# Patient Record
Sex: Female | Born: 1935 | Race: Black or African American | Hispanic: No | Smoking: Never smoker
Health system: Southern US, Community
[De-identification: ages and names within clinical notes are randomized; demographics above are authoritative.]

## PROBLEM LIST (undated history)

## (undated) DIAGNOSIS — J449 Chronic obstructive pulmonary disease, unspecified: Secondary | ICD-10-CM

## (undated) DIAGNOSIS — J45909 Unspecified asthma, uncomplicated: Secondary | ICD-10-CM

## (undated) DIAGNOSIS — M1990 Unspecified osteoarthritis, unspecified site: Secondary | ICD-10-CM

## (undated) DIAGNOSIS — I2510 Atherosclerotic heart disease of native coronary artery without angina pectoris: Secondary | ICD-10-CM

## (undated) DIAGNOSIS — I499 Cardiac arrhythmia, unspecified: Secondary | ICD-10-CM

## (undated) DIAGNOSIS — G7000 Myasthenia gravis without (acute) exacerbation: Secondary | ICD-10-CM

## (undated) DIAGNOSIS — E039 Hypothyroidism, unspecified: Secondary | ICD-10-CM

## (undated) DIAGNOSIS — E119 Type 2 diabetes mellitus without complications: Secondary | ICD-10-CM

## (undated) DIAGNOSIS — I4891 Unspecified atrial fibrillation: Secondary | ICD-10-CM

## (undated) DIAGNOSIS — K219 Gastro-esophageal reflux disease without esophagitis: Secondary | ICD-10-CM

## (undated) DIAGNOSIS — J439 Emphysema, unspecified: Secondary | ICD-10-CM

## (undated) HISTORY — PX: PARTIAL HYSTERECTOMY: SHX80

## (undated) HISTORY — PX: BREAST BIOPSY: SHX20

## (undated) HISTORY — PX: TONSILLECTOMY: SUR1361

## (undated) HISTORY — PX: CATARACT EXTRACTION: SUR2

## (undated) HISTORY — DX: Emphysema, unspecified: J43.9

## (undated) HISTORY — DX: Gastro-esophageal reflux disease without esophagitis: K21.9

---

## 2004-06-08 DIAGNOSIS — M810 Age-related osteoporosis without current pathological fracture: Secondary | ICD-10-CM | POA: Insufficient documentation

## 2004-09-12 ENCOUNTER — Ambulatory Visit: Payer: Self-pay | Admitting: Family Medicine

## 2005-11-14 ENCOUNTER — Ambulatory Visit: Payer: Self-pay | Admitting: Family Medicine

## 2007-01-14 ENCOUNTER — Ambulatory Visit: Payer: Self-pay | Admitting: Family Medicine

## 2007-01-21 ENCOUNTER — Ambulatory Visit: Payer: Self-pay | Admitting: Family Medicine

## 2008-09-17 ENCOUNTER — Ambulatory Visit: Payer: Self-pay | Admitting: Family Medicine

## 2009-04-04 ENCOUNTER — Ambulatory Visit: Payer: Self-pay | Admitting: Family Medicine

## 2009-05-01 ENCOUNTER — Emergency Department: Payer: Self-pay | Admitting: Emergency Medicine

## 2009-07-11 DIAGNOSIS — R002 Palpitations: Secondary | ICD-10-CM | POA: Insufficient documentation

## 2009-07-20 DIAGNOSIS — E7849 Other hyperlipidemia: Secondary | ICD-10-CM | POA: Insufficient documentation

## 2009-11-17 ENCOUNTER — Ambulatory Visit: Payer: Self-pay | Admitting: Family Medicine

## 2011-09-13 ENCOUNTER — Ambulatory Visit: Payer: Self-pay | Admitting: Family Medicine

## 2012-02-04 DIAGNOSIS — G4733 Obstructive sleep apnea (adult) (pediatric): Secondary | ICD-10-CM | POA: Insufficient documentation

## 2012-12-09 ENCOUNTER — Ambulatory Visit: Payer: Self-pay | Admitting: Family Medicine

## 2013-01-10 ENCOUNTER — Emergency Department: Payer: Self-pay | Admitting: Emergency Medicine

## 2013-01-10 LAB — BASIC METABOLIC PANEL
Anion Gap: 5 — ABNORMAL LOW (ref 7–16)
Calcium, Total: 9.4 mg/dL (ref 8.5–10.1)
Co2: 31 mmol/L (ref 21–32)
EGFR (African American): >60
EGFR (Non-African Amer.): >60
Osmolality: 284 (ref 275–301)
Potassium: 3.9 mmol/L (ref 3.5–5.1)
Sodium: 141 mmol/L (ref 136–145)

## 2013-01-10 LAB — CBC
HCT: 41.5 % (ref 35.0–47.0)
HGB: 14 g/dL (ref 12.0–16.0)
MCH: 34.1 pg — ABNORMAL HIGH (ref 26.0–34.0)
Platelet: 250 10*3/uL (ref 150–440)
RBC: 4.1 10*6/uL (ref 3.80–5.20)
WBC: 3.9 10*3/uL (ref 3.6–11.0)

## 2013-01-10 LAB — APTT: Activated PTT: 31.5 secs (ref 23.6–35.9)

## 2013-01-10 LAB — CK TOTAL AND CKMB (NOT AT ARMC): CK, Total: 82 U/L (ref 21–215)

## 2013-01-10 LAB — PROTIME-INR: Prothrombin Time: 12.1 secs (ref 11.5–14.7)

## 2013-01-16 ENCOUNTER — Ambulatory Visit: Payer: Self-pay | Admitting: Internal Medicine

## 2013-04-22 ENCOUNTER — Ambulatory Visit: Payer: Self-pay | Admitting: Family Medicine

## 2013-06-21 LAB — COMPREHENSIVE METABOLIC PANEL
Albumin: 3.3 g/dL — ABNORMAL LOW (ref 3.4–5.0)
Alkaline Phosphatase: 67 U/L (ref 50–136)
EGFR (African American): >60
EGFR (Non-African Amer.): >60
Potassium: 3.9 mmol/L (ref 3.5–5.1)
SGPT (ALT): 12 U/L (ref 12–78)
Total Protein: 7.5 g/dL (ref 6.4–8.2)

## 2013-06-21 LAB — CBC
MCHC: 31.3 g/dL — ABNORMAL LOW (ref 32.0–36.0)
Platelet: 226 10*3/uL (ref 150–440)

## 2013-06-21 LAB — PRO B NATRIURETIC PEPTIDE: B-Type Natriuretic Peptide: 528 pg/mL — ABNORMAL HIGH (ref 0–450)

## 2013-06-21 LAB — TROPONIN I: Troponin-I: <0.02 ng/mL

## 2013-06-21 LAB — CK TOTAL AND CKMB (NOT AT ARMC): CK-MB: 1.1 ng/mL (ref 0.5–3.6)

## 2013-06-22 ENCOUNTER — Inpatient Hospital Stay: Payer: Self-pay | Admitting: Internal Medicine

## 2013-06-22 LAB — CK TOTAL AND CKMB (NOT AT ARMC)
CK, Total: 86 U/L (ref 21–215)
CK, Total: 106 U/L (ref 21–215)
CK-MB: 2 ng/mL (ref 0.5–3.6)

## 2013-06-22 LAB — TROPONIN I: Troponin-I: <0.02 ng/mL

## 2013-06-23 LAB — CBC WITH DIFFERENTIAL/PLATELET
Basophil #: 0 10*3/uL (ref 0.0–0.1)
Basophil %: 0.1 %
Eosinophil %: 0 %
HCT: 34.8 % — ABNORMAL LOW (ref 35.0–47.0)
HGB: 11.9 g/dL — ABNORMAL LOW (ref 12.0–16.0)
Lymphocyte #: 0.2 10*3/uL — ABNORMAL LOW (ref 1.0–3.6)
MCH: 33.8 pg (ref 26.0–34.0)
MCHC: 34.2 g/dL (ref 32.0–36.0)
MCV: 99 fL (ref 80–100)
Monocyte #: 0.4 x10 3/mm (ref 0.2–0.9)
Neutrophil %: 91.4 %
Platelet: 253 10*3/uL (ref 150–440)
RBC: 3.53 10*6/uL — ABNORMAL LOW (ref 3.80–5.20)
WBC: 7.7 10*3/uL (ref 3.6–11.0)

## 2013-06-23 LAB — BASIC METABOLIC PANEL
Calcium, Total: 9 mg/dL (ref 8.5–10.1)
Chloride: 98 mmol/L (ref 98–107)
Glucose: 198 mg/dL — ABNORMAL HIGH (ref 65–99)
Osmolality: 283 (ref 275–301)

## 2013-06-23 LAB — HEMOGLOBIN A1C: Hemoglobin A1C: 7.4 % — ABNORMAL HIGH (ref 4.2–6.3)

## 2013-06-23 LAB — MAGNESIUM: Magnesium: 1.5 mg/dL — ABNORMAL LOW

## 2013-06-23 LAB — LIPID PANEL
Cholesterol: 158 mg/dL (ref 0–200)
HDL Cholesterol: 81 mg/dL — ABNORMAL HIGH (ref 40–60)
VLDL Cholesterol, Calc: 10 mg/dL (ref 5–40)

## 2013-06-26 LAB — CULTURE, BLOOD (SINGLE)

## 2014-02-19 ENCOUNTER — Inpatient Hospital Stay: Payer: Self-pay | Admitting: Student

## 2014-02-19 LAB — BASIC METABOLIC PANEL
ANION GAP: 2 — AB (ref 7–16)
BUN: 10 mg/dL (ref 7–18)
CHLORIDE: 106 mmol/L (ref 98–107)
CREATININE: 0.66 mg/dL (ref 0.60–1.30)
Calcium, Total: 8.6 mg/dL (ref 8.5–10.1)
Co2: 30 mmol/L (ref 21–32)
EGFR (Non-African Amer.): >60
Glucose: 87 mg/dL (ref 65–99)
Osmolality: 274 (ref 275–301)
POTASSIUM: 4.5 mmol/L (ref 3.5–5.1)
Sodium: 138 mmol/L (ref 136–145)

## 2014-02-19 LAB — CBC
HCT: 34.1 % — AB (ref 35.0–47.0)
HGB: 10.9 g/dL — AB (ref 12.0–16.0)
MCH: 32.6 pg (ref 26.0–34.0)
MCHC: 31.8 g/dL — ABNORMAL LOW (ref 32.0–36.0)
MCV: 103 fL — AB (ref 80–100)
Platelet: 286 10*3/uL (ref 150–440)
RBC: 3.33 10*6/uL — ABNORMAL LOW (ref 3.80–5.20)
RDW: 17.1 % — ABNORMAL HIGH (ref 11.5–14.5)
WBC: 4.5 10*3/uL (ref 3.6–11.0)

## 2014-02-19 LAB — PRO B NATRIURETIC PEPTIDE: B-Type Natriuretic Peptide: 2214 pg/mL — ABNORMAL HIGH (ref 0–450)

## 2014-02-19 LAB — TROPONIN I

## 2014-02-19 LAB — TSH: THYROID STIMULATING HORM: 1.96 u[IU]/mL

## 2014-02-20 LAB — CBC WITH DIFFERENTIAL/PLATELET
BASOS PCT: 0.2 %
Basophil #: 0 10*3/uL (ref 0.0–0.1)
EOS PCT: 0 %
Eosinophil #: 0 10*3/uL (ref 0.0–0.7)
HCT: 33.7 % — AB (ref 35.0–47.0)
HGB: 11 g/dL — AB (ref 12.0–16.0)
Lymphocyte #: 0.1 10*3/uL — ABNORMAL LOW (ref 1.0–3.6)
Lymphocyte %: 1.9 %
MCH: 33.4 pg (ref 26.0–34.0)
MCHC: 32.6 g/dL (ref 32.0–36.0)
MCV: 103 fL — ABNORMAL HIGH (ref 80–100)
MONO ABS: 0.1 x10 3/mm — AB (ref 0.2–0.9)
MONOS PCT: 1.9 %
NEUTROS ABS: 5.6 10*3/uL (ref 1.4–6.5)
NEUTROS PCT: 96 %
PLATELETS: 290 10*3/uL (ref 150–440)
RBC: 3.28 10*6/uL — ABNORMAL LOW (ref 3.80–5.20)
RDW: 17 % — ABNORMAL HIGH (ref 11.5–14.5)
WBC: 5.8 10*3/uL (ref 3.6–11.0)

## 2014-02-20 LAB — BASIC METABOLIC PANEL
Anion Gap: 2 — ABNORMAL LOW (ref 7–16)
BUN: 14 mg/dL (ref 7–18)
CALCIUM: 8.7 mg/dL (ref 8.5–10.1)
CHLORIDE: 104 mmol/L (ref 98–107)
CO2: 34 mmol/L — AB (ref 21–32)
Creatinine: 0.72 mg/dL (ref 0.60–1.30)
GLUCOSE: 178 mg/dL — AB (ref 65–99)
Osmolality: 284 (ref 275–301)
Potassium: 4.3 mmol/L (ref 3.5–5.1)
SODIUM: 140 mmol/L (ref 136–145)

## 2014-02-21 LAB — BASIC METABOLIC PANEL
ANION GAP: 5 — AB (ref 7–16)
BUN: 31 mg/dL — ABNORMAL HIGH (ref 7–18)
CALCIUM: 8.1 mg/dL — AB (ref 8.5–10.1)
CHLORIDE: 99 mmol/L (ref 98–107)
CREATININE: 1.26 mg/dL (ref 0.60–1.30)
Co2: 34 mmol/L — ABNORMAL HIGH (ref 21–32)
EGFR (Non-African Amer.): 41 — ABNORMAL LOW
GFR CALC AF AMER: 48 — AB
Glucose: 207 mg/dL — ABNORMAL HIGH (ref 65–99)
Osmolality: 288 (ref 275–301)
Potassium: 4.2 mmol/L (ref 3.5–5.1)
Sodium: 138 mmol/L (ref 136–145)

## 2014-02-21 LAB — CBC WITH DIFFERENTIAL/PLATELET
BASOS ABS: 0 10*3/uL (ref 0.0–0.1)
Basophil %: 0 %
EOS PCT: 0 %
Eosinophil #: 0 10*3/uL (ref 0.0–0.7)
HCT: 30.6 % — AB (ref 35.0–47.0)
HGB: 10.2 g/dL — ABNORMAL LOW (ref 12.0–16.0)
LYMPHS PCT: 1.8 %
Lymphocyte #: 0.1 10*3/uL — ABNORMAL LOW (ref 1.0–3.6)
MCH: 33.7 pg (ref 26.0–34.0)
MCHC: 33.2 g/dL (ref 32.0–36.0)
MCV: 102 fL — AB (ref 80–100)
MONO ABS: 0.3 x10 3/mm (ref 0.2–0.9)
MONOS PCT: 4.6 %
Neutrophil #: 6.5 10*3/uL (ref 1.4–6.5)
Neutrophil %: 93.6 %
Platelet: 277 10*3/uL (ref 150–440)
RBC: 3.01 10*6/uL — AB (ref 3.80–5.20)
RDW: 16.9 % — ABNORMAL HIGH (ref 11.5–14.5)
WBC: 7 10*3/uL (ref 3.6–11.0)

## 2014-02-22 LAB — BASIC METABOLIC PANEL
Anion Gap: 3 — ABNORMAL LOW (ref 7–16)
BUN: 41 mg/dL — ABNORMAL HIGH (ref 7–18)
Calcium, Total: 8.1 mg/dL — ABNORMAL LOW (ref 8.5–10.1)
Chloride: 99 mmol/L (ref 98–107)
Co2: 33 mmol/L — ABNORMAL HIGH (ref 21–32)
Creatinine: 1.14 mg/dL (ref 0.60–1.30)
EGFR (Non-African Amer.): 46 — ABNORMAL LOW
GFR CALC AF AMER: 54 — AB
GLUCOSE: 214 mg/dL — AB (ref 65–99)
OSMOLALITY: 287 (ref 275–301)
Potassium: 4.2 mmol/L (ref 3.5–5.1)
Sodium: 135 mmol/L — ABNORMAL LOW (ref 136–145)

## 2014-02-23 LAB — BASIC METABOLIC PANEL
ANION GAP: 2 — AB (ref 7–16)
BUN: 43 mg/dL — ABNORMAL HIGH (ref 7–18)
Calcium, Total: 8.3 mg/dL — ABNORMAL LOW (ref 8.5–10.1)
Chloride: 96 mmol/L — ABNORMAL LOW (ref 98–107)
Co2: 35 mmol/L — ABNORMAL HIGH (ref 21–32)
Creatinine: 1.03 mg/dL (ref 0.60–1.30)
EGFR (African American): >60
GFR CALC NON AF AMER: 52 — AB
Glucose: 203 mg/dL — ABNORMAL HIGH (ref 65–99)
Osmolality: 283 (ref 275–301)
Potassium: 4 mmol/L (ref 3.5–5.1)
Sodium: 133 mmol/L — ABNORMAL LOW (ref 136–145)

## 2014-02-23 LAB — BUN: BUN: 38 mg/dL — ABNORMAL HIGH (ref 7–18)

## 2014-02-23 LAB — CREATININE, SERUM
Creatinine: 0.96 mg/dL (ref 0.60–1.30)
EGFR (Non-African Amer.): 57 — ABNORMAL LOW

## 2014-02-25 LAB — HEMOGLOBIN: HGB: 12 g/dL (ref 12.0–16.0)

## 2014-02-26 LAB — BASIC METABOLIC PANEL
Anion Gap: 1 — ABNORMAL LOW (ref 7–16)
BUN: 42 mg/dL — ABNORMAL HIGH (ref 7–18)
CALCIUM: 8.4 mg/dL — AB (ref 8.5–10.1)
CHLORIDE: 94 mmol/L — AB (ref 98–107)
CREATININE: 0.9 mg/dL (ref 0.60–1.30)
Co2: 35 mmol/L — ABNORMAL HIGH (ref 21–32)
Glucose: 292 mg/dL — ABNORMAL HIGH (ref 65–99)
Osmolality: 282 (ref 275–301)
POTASSIUM: 4.9 mmol/L (ref 3.5–5.1)
SODIUM: 130 mmol/L — AB (ref 136–145)

## 2014-03-12 ENCOUNTER — Inpatient Hospital Stay: Payer: Self-pay | Admitting: Internal Medicine

## 2014-03-12 LAB — COMPREHENSIVE METABOLIC PANEL
ALT: 16 U/L (ref 12–78)
AST: 10 U/L — AB (ref 15–37)
Albumin: 3.5 g/dL (ref 3.4–5.0)
Alkaline Phosphatase: 49 U/L
Anion Gap: 10 (ref 7–16)
BUN: 36 mg/dL — AB (ref 7–18)
Bilirubin,Total: 0.6 mg/dL (ref 0.2–1.0)
CALCIUM: 9 mg/dL (ref 8.5–10.1)
CO2: 24 mmol/L (ref 21–32)
Chloride: 92 mmol/L — ABNORMAL LOW (ref 98–107)
Creatinine: 3.35 mg/dL — ABNORMAL HIGH (ref 0.60–1.30)
EGFR (Non-African Amer.): 13 — ABNORMAL LOW
GFR CALC AF AMER: 15 — AB
Glucose: 158 mg/dL — ABNORMAL HIGH (ref 65–99)
Osmolality: 265 (ref 275–301)
Potassium: 5.7 mmol/L — ABNORMAL HIGH (ref 3.5–5.1)
SODIUM: 126 mmol/L — AB (ref 136–145)
Total Protein: 6.9 g/dL (ref 6.4–8.2)

## 2014-03-12 LAB — URINALYSIS, COMPLETE
BILIRUBIN, UR: NEGATIVE
Bacteria: NONE SEEN
Blood: NEGATIVE
Glucose,UR: 50 mg/dL (ref 0–75)
Ketone: NEGATIVE
Nitrite: NEGATIVE
PH: 5 (ref 4.5–8.0)
Protein: 30
RBC,UR: 5 /HPF (ref 0–5)
SPECIFIC GRAVITY: 1.023 (ref 1.003–1.030)
Squamous Epithelial: 1

## 2014-03-12 LAB — CBC
HCT: 36.7 % (ref 35.0–47.0)
HGB: 12.3 g/dL (ref 12.0–16.0)
MCH: 33.8 pg (ref 26.0–34.0)
MCHC: 33.5 g/dL (ref 32.0–36.0)
MCV: 101 fL — ABNORMAL HIGH (ref 80–100)
Platelet: 194 10*3/uL (ref 150–440)
RBC: 3.64 10*6/uL — AB (ref 3.80–5.20)
RDW: 16.5 % — ABNORMAL HIGH (ref 11.5–14.5)
WBC: 3.3 10*3/uL — ABNORMAL LOW (ref 3.6–11.0)

## 2014-03-13 LAB — CBC WITH DIFFERENTIAL/PLATELET
BASOS ABS: 0 10*3/uL (ref 0.0–0.1)
BASOS PCT: 0.9 %
Eosinophil #: 0.1 10*3/uL (ref 0.0–0.7)
Eosinophil %: 4.6 %
HCT: 32.3 % — AB (ref 35.0–47.0)
HGB: 11.1 g/dL — ABNORMAL LOW (ref 12.0–16.0)
Lymphocyte #: 0.5 10*3/uL — ABNORMAL LOW (ref 1.0–3.6)
Lymphocyte %: 20.3 %
MCH: 34.2 pg — AB (ref 26.0–34.0)
MCHC: 34.4 g/dL (ref 32.0–36.0)
MCV: 100 fL (ref 80–100)
Monocyte #: 0.4 x10 3/mm (ref 0.2–0.9)
Monocyte %: 15.7 %
Neutrophil #: 1.6 10*3/uL (ref 1.4–6.5)
Neutrophil %: 58.5 %
Platelet: 175 10*3/uL (ref 150–440)
RBC: 3.24 10*6/uL — ABNORMAL LOW (ref 3.80–5.20)
RDW: 16.6 % — AB (ref 11.5–14.5)
WBC: 2.7 10*3/uL — AB (ref 3.6–11.0)

## 2014-03-13 LAB — BASIC METABOLIC PANEL
Anion Gap: 7 (ref 7–16)
BUN: 32 mg/dL — ABNORMAL HIGH (ref 7–18)
CALCIUM: 7.9 mg/dL — AB (ref 8.5–10.1)
Chloride: 99 mmol/L (ref 98–107)
Co2: 25 mmol/L (ref 21–32)
Creatinine: 1.92 mg/dL — ABNORMAL HIGH (ref 0.60–1.30)
EGFR (African American): 29 — ABNORMAL LOW
EGFR (Non-African Amer.): 25 — ABNORMAL LOW
GLUCOSE: 112 mg/dL — AB (ref 65–99)
OSMOLALITY: 270 (ref 275–301)
Potassium: 4.9 mmol/L (ref 3.5–5.1)
Sodium: 131 mmol/L — ABNORMAL LOW (ref 136–145)

## 2014-03-13 LAB — TSH: THYROID STIMULATING HORM: 2.19 u[IU]/mL

## 2014-03-14 LAB — POTASSIUM: Potassium: 5 mmol/L (ref 3.5–5.1)

## 2014-03-14 LAB — BASIC METABOLIC PANEL
Anion Gap: 6 — ABNORMAL LOW (ref 7–16)
BUN: 26 mg/dL — ABNORMAL HIGH (ref 7–18)
CHLORIDE: 99 mmol/L (ref 98–107)
Calcium, Total: 8.3 mg/dL — ABNORMAL LOW (ref 8.5–10.1)
Co2: 24 mmol/L (ref 21–32)
Creatinine: 0.92 mg/dL (ref 0.60–1.30)
EGFR (Non-African Amer.): >60
Glucose: 212 mg/dL — ABNORMAL HIGH (ref 65–99)
Osmolality: 270 (ref 275–301)
Potassium: 5.3 mmol/L — ABNORMAL HIGH (ref 3.5–5.1)
Sodium: 129 mmol/L — ABNORMAL LOW (ref 136–145)

## 2014-05-10 LAB — LIPID PANEL
Cholesterol: 172 mg/dL (ref 0–200)
HDL: 66 mg/dL (ref 35–70)
LDL Cholesterol: 86 mg/dL
Triglycerides: 100 mg/dL (ref 40–160)

## 2014-06-01 ENCOUNTER — Ambulatory Visit: Payer: Self-pay | Admitting: Family Medicine

## 2014-06-24 ENCOUNTER — Ambulatory Visit: Payer: Self-pay | Admitting: Family Medicine

## 2014-07-19 DIAGNOSIS — I10 Essential (primary) hypertension: Secondary | ICD-10-CM | POA: Insufficient documentation

## 2014-09-02 ENCOUNTER — Ambulatory Visit: Payer: Self-pay | Admitting: Family Medicine

## 2014-09-06 ENCOUNTER — Ambulatory Visit: Payer: Self-pay | Admitting: Family Medicine

## 2014-09-24 DIAGNOSIS — J449 Chronic obstructive pulmonary disease, unspecified: Secondary | ICD-10-CM | POA: Insufficient documentation

## 2014-11-03 LAB — TSH: TSH: 3.73 u[IU]/mL (ref <=5.90)

## 2014-12-29 DIAGNOSIS — G4730 Sleep apnea, unspecified: Secondary | ICD-10-CM | POA: Diagnosis not present

## 2014-12-29 DIAGNOSIS — L570 Actinic keratosis: Secondary | ICD-10-CM | POA: Diagnosis not present

## 2014-12-29 DIAGNOSIS — B370 Candidal stomatitis: Secondary | ICD-10-CM | POA: Diagnosis not present

## 2014-12-29 DIAGNOSIS — D0462 Carcinoma in situ of skin of left upper limb, including shoulder: Secondary | ICD-10-CM | POA: Diagnosis not present

## 2014-12-29 DIAGNOSIS — G7000 Myasthenia gravis without (acute) exacerbation: Secondary | ICD-10-CM | POA: Diagnosis not present

## 2014-12-29 DIAGNOSIS — E119 Type 2 diabetes mellitus without complications: Secondary | ICD-10-CM | POA: Diagnosis not present

## 2014-12-29 DIAGNOSIS — D485 Neoplasm of uncertain behavior of skin: Secondary | ICD-10-CM | POA: Diagnosis not present

## 2014-12-29 DIAGNOSIS — G4733 Obstructive sleep apnea (adult) (pediatric): Secondary | ICD-10-CM | POA: Diagnosis not present

## 2014-12-29 LAB — CBC AND DIFFERENTIAL: WBC: 5.8 10^3/mL

## 2015-01-06 DIAGNOSIS — G4733 Obstructive sleep apnea (adult) (pediatric): Secondary | ICD-10-CM | POA: Diagnosis not present

## 2015-01-19 DIAGNOSIS — D0462 Carcinoma in situ of skin of left upper limb, including shoulder: Secondary | ICD-10-CM | POA: Diagnosis not present

## 2015-01-19 DIAGNOSIS — C44629 Squamous cell carcinoma of skin of left upper limb, including shoulder: Secondary | ICD-10-CM | POA: Diagnosis not present

## 2015-02-06 DIAGNOSIS — G4733 Obstructive sleep apnea (adult) (pediatric): Secondary | ICD-10-CM | POA: Diagnosis not present

## 2015-03-07 DIAGNOSIS — G4733 Obstructive sleep apnea (adult) (pediatric): Secondary | ICD-10-CM | POA: Diagnosis not present

## 2015-03-21 DIAGNOSIS — L923 Foreign body granuloma of the skin and subcutaneous tissue: Secondary | ICD-10-CM | POA: Diagnosis not present

## 2015-03-25 DIAGNOSIS — K219 Gastro-esophageal reflux disease without esophagitis: Secondary | ICD-10-CM | POA: Diagnosis not present

## 2015-03-25 DIAGNOSIS — N183 Chronic kidney disease, stage 3 (moderate): Secondary | ICD-10-CM | POA: Diagnosis not present

## 2015-03-25 DIAGNOSIS — R0609 Other forms of dyspnea: Secondary | ICD-10-CM | POA: Diagnosis not present

## 2015-03-25 DIAGNOSIS — I480 Paroxysmal atrial fibrillation: Secondary | ICD-10-CM | POA: Diagnosis not present

## 2015-03-29 DIAGNOSIS — G4733 Obstructive sleep apnea (adult) (pediatric): Secondary | ICD-10-CM | POA: Diagnosis not present

## 2015-03-29 DIAGNOSIS — R0609 Other forms of dyspnea: Secondary | ICD-10-CM | POA: Diagnosis not present

## 2015-03-29 DIAGNOSIS — J449 Chronic obstructive pulmonary disease, unspecified: Secondary | ICD-10-CM | POA: Diagnosis not present

## 2015-04-01 NOTE — H&P (Signed)
PATIENT NAME:  Julia Crawford, Julia Crawford MR#:  [id number] DATE OF BIRTH:  [birth year]  DATE OF ADMISSION:  06/22/2013  PRIMARY CARE PHYSICIAN: Dr. Margarita Rana.   REFERRING PHYSICIAN: Dr. Jasmine December.   CHIEF COMPLAINT: Shortness of breath, cough, lower extremity edema, right-sided chest pain.   HISTORY OF PRESENT ILLNESS: The patient is a 80 year old morbidly obese female with past medical history of asthma, COPD, hypothyroidism, myasthenia gravis, GERD, multiple other medical problems, is presenting to the ER with a chief complaint of a 102-month history of shortness of breath. The patient is reporting that her shortness of breath is gradually getting worse and for the past 1 month, she has been noticing swelling in her legs. Also, she has been coughing and bringing up somewhat whitish-yellow phlegm. Denies any fever. Denies any weight gain, though. The patient has chronic history of obstructive sleep apnea, and she could not tolerate CPAP in the past and her physician has recommended not to use CPAP or oxygen. Now, the patient thinks she would be better off with CPAP and wants to try CPAP tonight. As the patient was hypoxic, she was placed on 4 liters of oxygen in the ER, and CT angiogram of the chest is ordered to rule out pulmonary embolism. The study is done but the result is still pending at this time. The patient denies any history of smoking in the past. She has chronic history of myasthenia gravis and goes to [facility] regarding this and uses Imuran and pyridostigmine. The patient has received IV Lasix, and blood cultures were obtained. A 12-lead EKG has revealed a left bundle branch block and sinus bradycardia. The left bundle branch block is old according to the ER physician. During my examination, the patient was becoming short of breath with minimal exertion, and she was in sinus bradycardia. No family members are at bedside.   PAST MEDICAL HISTORY: Myasthenia gravis, obstructive sleep apnea, morbid  obesity, GERD, colon polyps, diabetes mellitus, asthma, hypothyroidism, angina.   PAST SURGICAL HISTORY: Thyroidectomy, partial hysterectomy, history of plasma exchange, tonsillectomy, multiple breast biopsies.   ALLERGIES: CODEINE AND DOXYCYCLINE.   PSYCHOSOCIAL HISTORY: She is married. On disability secondary to multiple medical problems. Denies any smoking, alcohol or illicit drug usage. Lives with daughter.   FAMILY HISTORY: Two brothers have a history of diabetes mellitus, and the patient's dad also has diabetes mellitus.   HOME MEDICATIONS: Azathioprine 50 mg 2-1/2 tablets orally once daily, Actos 30 mg 1/2 tablet once a day, Cardizem 240 mg extended release 1 capsule once daily, levothyroxine 100 mcg once daily, metoclopramide 10 mg 2 times a day, metformin 1000 mg 1/2 tablet orally at breakfast and lunchtime and 1000 mg at dinnertime, simvastatin 10 mg once daily, pyridostigmine 60 mg p.o. every 6 hours for myasthenia gravis.   REVIEW OF SYSTEMS:  CONSTITUTIONAL: Denies any fever but complaining of fatigue and weakness. Denies weight gain but complaining of swelling in her feet.  EYES: Denies any blurry vision, glaucoma,  ENT: Denies epistaxis, discharge.  RESPIRATION: Complaining of cough. Has history of asthma. According to her old medical records, she has COPD, but the patient denies any. Has obstructive sleep apnea, also.  CARDIOVASCULAR: Complaining of right-sided chest pain after sustaining a fall last Monday which is reproducible. Denies any palpitations or syncope.  GASTROINTESTINAL: Denies nausea, vomiting or diarrhea. No abdominal pain.  GENITOURINARY: No dysuria, hematuria.  GYNECOLOGIC AND BREASTS: Multiple breast biopsies in the past. Status post hysterectomy. No vaginal discharge.  ENDOCRINE: Denies any polyuria,  nocturia. Has thyroid problems.  HEMATOLOGIC AND LYMPHATIC: Denies any anemia or easy bruising.  INTEGUMENTARY: No acne, rash, lesions.  MUSCULOSKELETAL: No  joint pain in the neck, back or shoulder. Denies any gout.  NEUROLOGIC: Denies any vertigo, ataxia, dementia.  PSYCHIATRIC: Denies any ADD, OCD, bipolar disorder.   PHYSICAL EXAMINATION:  VITAL SIGNS: Temperature 98.4, pulse 49, respirations 30 to 36, blood pressure 167/76, pulse ox 85% on room air but satting 95% on 4 liters.  GENERAL APPEARANCE: Not in any acute distress, but morbidly obese, becoming short of breath with minimal exertion.  HEENT: Normocephalic, atraumatic. Pupils are equally reactive to light and accommodation. No scleral icterus. No conjunctival injection. No sinus tenderness. No postnasal drip.  NECK: Supple. No JVD. Range of motion is intact. No thyromegaly.  LUNGS: Distant breath sounds. Diffuse wheezing is present. No accessory muscle usage. Positive crackles, rales and rhonchi.  CARDIAC: S1 and S2 normal. Regular rate and rhythm. Positive murmur. Positive right inframammary tenderness close to right lateral axillary area. The pain is reproducible. No bruising is noticed.  ABDOMEN: Soft, morbidly obese. Bowel sounds are positive in all 4 quadrants. Nontender, nondistended. No masses felt. Could not rule out hepatosplenomegaly in view of morbid obesity.  NEUROLOGIC: Awake, alert, oriented x3. Motor and sensory are grossly intact. Cranial nerves II through XII are intact. Reflexes are 2+.  EXTREMITIES: Pitting edema 3+ is present, but no cyanosis, no clubbing.  SKIN: Warm to touch. Normal turgor. No rashes. No lesions.  MUSCULOSKELETAL: No joint effusion, tenderness or erythema.  PSYCHIATRIC: Normal mood and affect.   LABS AND IMAGING STUDIES: I just have received CT angiogram of the chest which reveals minimal right basilar atelectasis. No evidence of pulmonary embolus. Chest x-ray has revealed pulmonary edema and cannot rule out underlying infiltrates. A 12-lead EKG with left bundle branch block which is chronic according to the ER physician's note, and the patient is having  sinus bradycardia. BNP is elevated 528. Glucose 152, BUN 13, creatinine 0.69, sodium 140, potassium 3.9, chloride 106, CO2 31. GFR greater than 60. Anion gap 3. Serum osmolality 282. CK total 90. CK-MB 1.1. Troponin less than 0.02. WBC 6.3, hemoglobin 10.9, hematocrit 34.7, platelets 226. LFTs are within normal range except albumin which is low at 3.3.   ASSESSMENT AND PLAN:  1. Acute respiratory failure with hypoxia secondary to congestive heart failure, possible pneumonia versus acute bronchitis, acute exacerbation of chronic obstructive pulmonary disease. Will admit her to telemetry. Continue 4 liters of oxygen and use CPAP at bedtime. Blood cultures and sputum cultures were ordered. The patient will be on intravenous levofloxacin. Solu-Medrol 125 mg intravenous is given and will continue Solu-Medrol 60 mg intravenous q.6 hours. Will provide her nebulizer treatments. For congestive heart failure, will give her Lasix 40 mg intravenous q.8 hours. Will obtain 2-D echocardiogram. The patient will be on aspirin, on statin and a small dose of beta blocker if the patient is not hypotensive or bradycardic. Cardiology consult is placed to Dr. Clayborn Bigness.  2. Rule out pulmonary embolism. CT angiogram of the chest is done, and pulmonary embolism is ruled out.  3. Obstructive sleep apnea: The patient wants to try CPAP. Will provide her CPAP at bedtime.  4. History of myasthenia gravis: Continue her home medications Imuran and pyridostigmine.   5. Diabetes mellitus: Will resume her home medications, and the patient will be on insulin sliding scale, especially since she is going to be on intravenous steroids.  6. Hypertension: Resume her home medications except  Cardizem which is on hold in view of bradycardia.  7. Hypothyroidism. Continue Synthroid. Will check TSH in the a.m.  8. Will monitor daily weights, ins and outs.  9. Will provide her gastrointestinal prophylaxis and deep vein thrombosis prophylaxis with  Lovenox subcutaneous.   CODE STATUS: She is FULL CODE.   Daughter is medical power of attorney. The diagnosis and plan of care was discussed in detail with the patient. She is aware of the plan.   Total time spent on admission including discussion with the patient, physical examination, reviewing old records and discussion with the ER staff is 55 minutes.   ____________________________ Nicholes Mango, MD ag:gb D: 06/22/2013 01:16:28 ET T: 06/22/2013 02:46:16 ET JOB#: [id number]  cc: Nicholes Mango, MD, <Dictator> Jerrell Belfast, MD Dwayne D. Clayborn Bigness, MD Nicholes Mango MD ELECTRONICALLY SIGNED 07/05/2013 6:38

## 2015-04-01 NOTE — Consult Note (Signed)
PATIENT NAME:  Julia Crawford, Julia Crawford MR#:  [id number] DATE OF BIRTH:  28-Dec-1934  DATE OF CONSULTATION:  06/22/2013  REFERRING PHYSICIAN:  Dr. Bobbye Charleston  CONSULTING PHYSICIAN:  Corey Skains, MD  REASON FOR CONSULTATION: Congestive heart failure, diabetes, sleep apnea, hypertension with worsening shortness of breath.   CHIEF COMPLAINT: "I had worsening shortness of breath."   HISTORY OF PRESENT ILLNESS: This is a 80 year old female with a previous history of congestive heart failure, diastolic dysfunction in nature with new onset of shortness of breath. This has been waxing and waning over the last month although culminating in severe shortness of breath with this hospitalization. The patient has had an echocardiogram showing normal LV function in the past. In addition to that, the patient has had no evidence of significant valvular heart disease and no previous myocardial infarction. The patient does have mild pulmonary edema by chest x-ray but normal troponin, CK-MB without evidence of myocardial infarction. She has not had any chest pain. The patient has had diabetes, hypertension, and hyperlipidemia which have been well controlled, but has sleep apnea which has had variable treatments. The patient now is feeling much better with oxygenation. The remainder of review of systems includes negative for weakness, fatigue, vision change, ringing in the ears, hearing loss, cough, congestion, heartburn, nausea, vomiting, diarrhea, bloody stools, stomach pain, extremity pain, leg weakness, cramping of the buttocks, known blood clots, headaches, blackouts, dizzy spells, nosebleeds, trouble swallowing, frequent urination, urination at night, muscle weakness, numbness, anxiety, depression, skin lesions or skin rashes.  PAST MEDICAL HISTORY: 1. Heart failure.  2. Hypertension.  3. Hyperlipidemia.  4. Myasthenia gravis.  5. Hyperlipidemia.   FAMILY HISTORY: No family members with early onset of cardiovascular  disease or hypertension.   SOCIAL HISTORY: She currently denies alcohol or tobacco use.   ALLERGIES: As listed.   MEDICATIONS: As listed.   PHYSICAL EXAMINATION:  VITAL SIGNS: Blood pressure 146/68 bilaterally, heart rate 72 upright, reclining and regular.   GENERAL: She is a well appearing female in no acute distress.   HEENT: No icterus, thyromegaly, ulcers, hemorrhage, or xanthelasma.   CARDIOVASCULAR: Regular rate and rhythm. Normal S1 and S2 with no apparent murmur, gallop or rub. PMI is diffuse. Carotid upstroke normal without bruit. Jugular venous pressure is normal.   LUNGS:  Few basilar crackles with normal respirations with some wheezing.   ABDOMEN: Soft, nontender without hepatosplenomegaly or masses. Abdominal aorta cannot be heard or felt.   EXTREMITIES: There are 2+ radial, femoral, dorsal pedal pulses with trace lower extremity edema. No cyanosis, clubbing or ulcers.   NEUROLOGIC: She is oriented to time, place and person with normal mood and affect.   ASSESSMENT: This is a 80 year old female with hypertension, hyperlipidemia, diabetes with acute on chronic diastolic dysfunction congestive heart failure without evidence of myocardial infarction or true angina.   RECOMMENDATIONS: 1. Echocardiogram for further evaluation ofdiastolic function and heart failure and other valvular heart disease contributing. 2. Hypertension and hyperlipidemia. Control with her previous medications.  3. Lasix was necessary for edema and pulmonary edema with shortness of breath.  4. Oxygen. 5. No further cardiac intervention at this time due to no evidence of myocardial infarction or true angina.  6. CPAP machine with sleep apnea for further risk reduction in sleep apnea-type heart failure and ambulate with further adjustments of medications as necessary.     ____________________________ Corey Skains, MD bjk:rw D: 06/22/2013 17:46:19 ET T: 06/22/2013 19:36:42  ET JOB#: [id number]  cc: Corey Skains,  MD, <Dictator> Corey Skains MD ELECTRONICALLY SIGNED 06/23/2013 8:25

## 2015-04-01 NOTE — Discharge Summary (Signed)
PATIENT NAME:  Julia Crawford, Julia Crawford MR#:  [id number] DATE OF BIRTH:  [birth year]  DATE OF ADMISSION:  06/22/2013 DATE OF DISCHARGE:  06/25/2013  DISCHARGE DIAGNOSES:   1.  Acute on chronic respiratory failure secondary to acute on chronic diastolic heart failure.  2.  Chronic obstructive pulmonary disease flare.  3.  Diabetes mellitus type 2.  4.  Myasthenia gravis.  5.  Hypothyroidism.   MEDICATIONS:  1.  Levothyroxine 100 mcg by mouth daily.  2.  B12 1000 mcg by mouth daily.  3.  Pyridostigmine 60 mg every six hours.  4.  Azathioprine 50 mg by mouth 2.5 tablets daily.  5.  Reglan 10 mg by mouth twice daily.  6.  Albuterol inhalation 2 puffs 4 times daily.  7.  Lasix 40 mg by mouth twice daily.  8.  KCl 20 mEq by mouth daily.  9.  Glucophage 500 mg by mouth twice daily.  10.  Prednisone 20 mg 3 tablets daily for 2 days, 2 tablets daily for 2 days, 1 tablet daily for 2 days and then stop.  11.  Levaquin 750 mg q. 48 hours until July 23rd.  12.  Oxygen 2 liters by nasal cannula.  The patient's O2 sats dropped to 83% on exertion without oxygen, and with oxygen on 2 liters brought the patient up to 92 so we discharged her with 2 liters of oxygen.   CONSULTATIONS:  Cardiology consult with Dr. Nehemiah Massed.    The patient had home care services before so we have continued with the home services.   DISCHARGE CONDITION:  Stable.    HOSPITAL COURSE:   1.  This is a 80 year old female patient admitted on July 14th because of shortness of breath, lower extremity edema, right-sided chest pain.  The patient has a 2 month history of shortness of breath getting progressively worse with pedal edema and the patient's CT chest on admission showed no pulmonary emboli.  BNP elevated at 528 and the  patient admitted because of CHF exacerbation with.with asthma and bronchitis, started on oxygen along with Levaquin, Solu-Medrol, along with IV Lasix.  The patient had an echocardiogram, also seen by Dr. Nehemiah Massed and the  patient continued on IV steroids along with Levaquin, nebulizers and Lasix as well.  Dr. Nehemiah Massed saw the patient.  The patient diuresed with Lasix and also he recommended echocardiogram.  Echocardiogram showed an EF of  62%with normal LV function and the patient continued on Lasix along with aspirin and she had normal troponins and patient continued on outpatient medications and discharged home in stable condition.  She did have hypoxia on room air requiring oxygen so we had to send her on 2 liters.  Continue on Lasix as well.  The patient was using Cardizem at home, but the heart rate here is around 80, so we did not restart that. That can be followed up with her primary doctor and blood pressure also 129/68 at the time of discharge.  2.  COPD flare and bronchitis.  The patient continued on nebulizers, steroids and Levaquin. At the time of discharge she had minimal wheezing, but she wanted to really go home and we checked her O2 sats and she desatted to 83% so she had to get sent home with oxygen.  She can follow with Dr. Margarita Rana regarding further need to continue oxygen or not.   3.  Myasthenia Gravis. Follows up with [facility] and the patient is on pyridostigmine and also azathioprine and we continued that.  4.  Hypothyroidism, on Synthroid.  We continued that.  5.  Diabetes mellitus type 2.  Metformin is not given for two days because of the CT chest with contrasT., and at the time of discharge advised to continue her metformin.    PERTINENT LABORATORY DATA:  Troponin less than 0.02.  Electrolytes on admission:  Sodium 140, potassium 3.9, chloride 106, bicarb 31, BUN 13, creatinine 0.6 and glucose 152.  LFTs within normal limits.  BNP 528.  The patient's CT chest showed no focal consolidation, bilateral patchy ground-glass opacity secondary to infectious or inflammation versus edema.  The patient had blood cultures which are negative.  She remained afebrile.   Time spent on discharge preparation more  than 30 minutes.   ____________________________ Epifanio Lesches, MD sk:ea D: 06/27/2013 20:34:57 ET T: 06/28/2013 06:28:21 ET JOB#: [id number]  cc: Epifanio Lesches, MD, <Dictator> Jerrell Belfast, MD  Epifanio Lesches MD ELECTRONICALLY SIGNED 07/02/2013 8:11

## 2015-04-02 NOTE — Consult Note (Signed)
Chief Complaint:  Subjective/Chief Complaint Pt sob still on 02. Denies cp but still has some palps. DOE.   VITAL SIGNS/ANCILLARY NOTES: **Vital Signs.:   16-Mar-15 04:26  Vital Signs Type Routine  Temperature Temperature (F) 97.6  Celsius 36.4  Temperature Source oral  Pulse Pulse 88  Respirations Respirations 18  Systolic BP Systolic BP 700  Diastolic BP (mmHg) Diastolic BP (mmHg) 88  Mean BP 110  Pulse Ox % Pulse Ox % 93  Pulse Ox Activity Level  At rest  Oxygen Delivery 4L  *Intake and Output.:   Daily 16-Mar-15 07:00  Grand Totals Intake:  480 Output:  575    Net:  -95 24 Hr.:  -95  Oral Intake      In:  480  Urine ml     Out:  575  Length of Stay Totals Intake:  1600 Output:  3225    Net:  -1625   Brief Assessment:  GEN well developed, well nourished, no acute distress   Cardiac Irregular  murmur present  -- LE edema  -- JVD   Respiratory normal resp effort   Gastrointestinal Normal   Gastrointestinal details normal Soft   EXTR positive edema   Lab Results: Routine Chem:  16-Mar-15 04:08   Glucose, Serum  214  BUN  41  Creatinine (comp) 1.14  Sodium, Serum  135  Potassium, Serum 4.2  Chloride, Serum 99  CO2, Serum  33  Calcium (Total), Serum  8.1  Anion Gap  3  Osmolality (calc) 287  eGFR (African American)  54  eGFR (Non-African American)  46 (eGFR values <40m/min/1.73 m2 may be an indication of chronic kidney disease (CKD). Calculated eGFR is useful in patients with stable renal function. The eGFR calculation will not be reliable in acutely ill patients when serum creatinine is changing rapidly. It is not useful in  patients on dialysis. The eGFR calculation may not be applicable to patients at the low and high extremes of body sizes, pregnant women, and vegetarians.)   Radiology Results: XRay:    13-Mar-15 16:41, Chest Portable Single View  Chest Portable Single View   REASON FOR EXAM:    shortness of breath  COMMENTS:        PROCEDURE: DXR - DXR PORTABLE CHEST SINGLE VIEW  - Feb 19 2014  4:41PM     CLINICAL DATA:  Shortness of breath.    EXAM:  PORTABLE CHEST - 1 VIEW    COMPARISON:  Chest x-ray 06/24/2013.    FINDINGS:  Cardiomegaly with pulmonary vascular prominence. Increased  interstitial markings are noted from prior exam. Findings consistent  with congestive heart failure. No pleural effusion or pneumothorax  noted. No acute osseous abnormality.     IMPRESSION:  Congestive heart failure with mild interstitial edema. Interstitial  edema has progressed from prior exam.      Electronically Signed    By: TMarcello Moores Register    On: 02/19/2014 16:45         Verified By: TOsa Craver M.D., MD    15-Mar-15 09:03, Chest Portable Single View  Chest Portable Single View   REASON FOR EXAM:    sob.  COMMENTS:       PROCEDURE: DXR - DXR PORTABLE CHEST SINGLE VIEW  - Feb 21 2014  9:03AM     CLINICAL DATA:  Shortness of breath    EXAM:  PORTABLE CHEST - 1 VIEW    COMPARISON:  02/19/2014    FINDINGS:  Cardiomegaly is again  seen. The lungs are well aerated bilaterally.  No focal infiltrate or sizable effusion is noted.   IMPRESSION:  No acute abnormality noted.      Electronically Signed    By: Inez Catalina M.D.    On: 02/21/2014 09:03         Verified By: Everlene Farrier, M.D.,  Cardiology:    13-Mar-15 16:04, ED ECG  Ventricular Rate 84  Atrial Rate 102  QRS Duration 128  QT 384  QTc 453  R Axis -58  T Axis 117  ECG interpretation   Atrial fibrillation  Left axis deviation  Non-specific intra-ventricular conduction block  Possible Anterolateral infarct (cited on or before 21-Jun-2013)  Abnormal ECG  When compared with ECG of 21-Jun-2013 22:01,  Atrial fibrillation has replaced Sinus rhythm  Vent. rate has increased BY  32 BPM  QRS axis Shifted left  Questionable change in initial forces of Lateral leads  ----------unconfirmed----------  Confirmed by OVERREAD, NOT  ([id number]), editor PEARSON, BARBARA ([id number]) on 02/20/2014 9:13:25 AM  ED ECG    Assessment/Plan:  Assessment/Plan:  Assessment IMP AFIB CHF SOB MG OSA HTN Hyperlipidemia DM .   Plan PLAN Agree with DM control IV Lasix 02 CPAP Bp control ROMI Wgt loss and exerise Rec Heart rate control Anticoug long term Statin therapPT/OT   Electronic Signatures: Lujean Amel D (MD)  (Signed 01-Apr-15 12:18)  Authored: Chief Complaint, VITAL SIGNS/ANCILLARY NOTES, Brief Assessment, Lab Results, Radiology Results, Assessment/Plan   Last Updated: 01-Apr-15 12:18 by Lujean Amel D (MD)

## 2015-04-02 NOTE — H&P (Signed)
PATIENT NAME:  Julia Crawford, Julia Crawford MR#:  [id number] DATE OF BIRTH:  [birth year]  DATE OF ADMISSION:  02/19/2014  PRIMARY CARE PHYSICIAN: Margarita Rana, MD  CHIEF COMPLAINT: Shortness of breath, wheezing.   HISTORY OF PRESENT ILLNESS: This is a very pleasant 80 year old female with a history of COPD who apparently was supposed to be wearing oxygen but has not; congestive heart failure, diastolic; myasthenia gravis; LBCD; type 2 diabetes; and hypothyroidism who presents with the above complaint.   Over the past week, the patient has had increasing shortness of breath, wheezing, coughing, low-grade fever. She saw her primary care physician today and was asked to come here for further evaluation.   In the ER, chest x-ray showed pulmonary edema. She has received Lasix; in addition, she is  wheezing and so received IV steroids as well.   REVIEW OF SYSTEMS:  CONSTITUTIONAL: No fever. Positive fatigue, weakness.  EYES: No blurred or double vision. No inflammation.  ENT: No ear pain, hearing loss, seasonal allergies, or postnasal drip. RESPIRATORY: Positive cough. Positive wheezing. Positive COPD. Positive dyspnea. CARDIOVASCULAR: No chest pain, palpitations, or orthopnea, syncope, or edema. Positive dyspnea on exertion.  GASTROINTESTINAL: No vomiting, diarrhea, abdominal pain, melena, or ulcers.  GENITOURINARY: No dysuria or hematuria.  ENDOCRINE: No polyuria or polydipsia.  HEMOLYMPHATIC: No anemia or easy bruising.  SKIN: No rash or lesions.  MUSCULOSKELETAL: No pain in the hips or shoulders.  NEUROLOGIC: No history of CVA, TIA, or seizures.  PSYCHIATRIC: No history of anxiety or depression.   PAST MEDICAL HISTORY:  1.  Hypertension.  2.  Diabetes.  3.  GERD.  4.  Congestive heart failure, diastolic. A 2-D echocardiogram, July 2014 which showed an ejection fraction of 50% to 55% with moderately elevated pulmonary arterial systolic pressure, mild-to-moderate TR, mildly increased left ventricular  posterior wall thickness.  5.  Myasthenia gravis, without exacerbation.  6.  Asthma.  7.  Hyperlipidemia.  8.  Vitamin B12 deficiency with anemia.  9.  Osteoporosis.  10.  Diabetes.  11.  Sleep apnea.  12.  Hypothyroidism.   ALLERGIES: CODEINE. DOXYCYCLINE CAUSES HIVES.   FAMILY HISTORY: No history of CAD or stroke.   PAST SURGICAL HISTORY: Hysterectomy.   SOCIAL HISTORY: No tobacco, alcohol or drug use.   MEDICATIONS:  1.  Pyridostigmine 60 mg q.6 hours.  2.  Omeprazole 20 mg b.i.d. 3.  Reglan 10 mg b.i.d.  4.  Synthroid 100 mcg daily.  5.  K-Chlor 20 mEq daily.  6.  Glucophage 500 b.i.d.  7.  Lasix 40 mg b.i.d.  8.  Diltiazem 240 mg daily.  9.  B12 at 1000 mcg daily.  10.  Azathioprine 50 mg two and a half tablets daily.  11.  Advair Diskus 250/50.   PHYSICAL EXAMINATION:  VITAL SIGNS: Temperature 98.2, pulse 63, respirations 20, blood pressure 171/73, 94% on 2 L.  GENERAL: Sitting up with increased work of breathing in moderate distress.  HEENT: Head is atraumatic. Pupils are round and reactive. Sclerae anicteric. Mucous membranes are moist. Oropharynx is clear. OROPHARYNX: Clear.  NECK: Supple. Positive JVD up to the level of the jaw. No enlarged thyroid.  CARDIOVASCULAR: Regular rate and rhythm with a 2/6 systolic ejection murmur heard best at the right sternal border. There is no radiation.  LUNGS: Diffuse wheezing and minimal crackles at the bases. No rales are heard. No dullness to percussion. No egophony.  ABDOMEN: Bowel sounds are positive. Nontender, nondistended. Hard to appreciate organomegaly duet to body habitus. No rebound,  guarding.  EXTREMITIES: No clubbing, cyanosis or edema.  NEUROLOGIC: Cranial nerves II through XII are intact. There are no focal deficits.  SKIN: Without rash or lesions.  MUSCULOSKELETAL: Able to move all extremities.   LABORATORY, DIAGNOSTIC AND RADIOLOGIC DATA: White blood cells 4.5, hemoglobin 11, hematocrit 34.1, platelet 286,  troponin less than 0.02, sodium 138, potassium 4.5, chloride 106, bicarbonate 30, BUN 10, creatinine 0.66. Glucose is 87. BNP 2214.   Chest x-ray is consistent with congestive heart failure with interstitial edema.   EKG: Atrial fibrillation with a heart rate of 84   ASSESSMENT AND PLAN: This is a 80 year old female who presents with shortness of breath, wheezing, and appears, new-onset atrial fibrillation.   1.  Acute respiratory failure. The patient has increased work of breathing, is tachypneic, is now requiring oxygen. Although she had been on oxygen previously, she says that she does not use  oxygen anymore. I suspect this is multifactorial related to her congestive heart failure, acute-on-chronic diastolic as well as chronic obstructive pulmonary disease exacerbation. Plan is outlined below.  2.  Acute-on-chronic diastolic heart failure. She had an echocardiogram back in July which showed normal ejection fraction. She does have evidence of diastolic dysfunction, I suspect from her underlying asthma/chronic obstructive pulmonary disease, possibly with uncontrolled hypertension as well. The patient will be on Lasix. In's and out's will be monitored. Daily weight will be monitored as well as creatinine.  3.  New-onset atrial fibrillation. I suspect this is from her acute congestive heart failure. I will go ahead and consult cardiology. Her CHADS score is 4, so she is at high risk for a stroke. She has seen Dr. Nehemiah Massed as her cardiologist, so I will consult [facility] cardiology.  4.  Acute chronic obstructive pulmonary disease exacerbation. The patient has wheezing on examination. Likely it could be from pulmonary edema, but the patient does have a history of chronic obstructive pulmonary disease, so we will start IV steroids, antibiotics, DuoNebs, and continue her Advair.  5.  Diabetes. We will provide sliding-scale insulin, ADA diet. Hold her metformin. Monitor blood sugars while the patient  is on steroids.  6.  Sleep apnea. The patient denies that she is using a CPAP machine at home.  7.  Malignant hypertension. The patient's blood pressure is elevated. Will continue her outpatient medications but I have also added hydralazine p.r.n. and also added lisinopril 40 mg for better blood pressure control.  8.  Hypothyroidism. Can continue her Synthroid.   CODE STATUS: DNI. DO NOT INTUBATE. OKAY TO USE RESUSCITATE AND USE DEFIBRILLATOR.   TIME SPENT: Approximately 55 minutes.   ____________________________ Donell Beers. Benjie Karvonen, MD spm:np D: 02/19/2014 18:31:59 ET T: 02/19/2014 20:37:41 ET JOB#: [id number]  cc: Autumne Kallio P. Benjie Karvonen, MD, <Dictator> Jerrell Belfast, MD  Donell Beers Nakiah Osgood MD ELECTRONICALLY SIGNED 02/25/2014 21:32

## 2015-04-02 NOTE — Consult Note (Signed)
PATIENT NAME:  Julia Crawford, Julia Crawford MR#:  [id number] DATE OF BIRTH:  10-21-1935  DATE OF CONSULTATION:  02/20/2014  CONSULTING PHYSICIAN:  Dwayne D. Callwood, MD  INDICATION: Shortness of breath, heart failure, as well as AFib.  REFERRING PHYSICIAN: Dr. Venia Minks as well as Dr. Benjie Karvonen  HISTORY OF PRESENT ILLNESS: The patient is a 80 year old black female with COPD,  obesity, myasthenia, congestive heart failure, diastolic dysfunction, diabetes, hypothyroidism, who states that over the last week or so she has had progressive shortness of breath, dyspnea and wheezing at rest. It finally got bad enough that she came into the hospital. She has had a low-grade fever. She saw her primary care physician, who then recommended that patient be admitted for evaluation. Chest x-ray showed pulmonary edema. She got some Lasix. Had some wheezing. Received IV steroids and inhalers, which seemed to improve her symptoms. Denied any chest pain. She was subsequently advised to be admitted for further evaluation and care.   REVIEW OF SYSTEMS: Denies blackout spells, syncope. No nausea, vomiting. Denies fever. No chills. No sweats. No weight loss. No weight gain. No hemoptysis or hematemesis. Denies bright red blood per rectum. She has had congestion, cough, wheezing, shortness of breath, PND and orthopnea.   PAST MEDICAL HISTORY: Hypertension, diabetes, GERD, congestive heart failure, diastolic dysfunction, myasthenia gravis, asthma, hyperlipidemia, B12 deficiency, osteoporosis, diabetes, obstructive sleep apnea, hypothyroidism.   ALLERGIES: CODEINE, DOXYCYCLINE.   FAMILY HISTORY: Hypertension.   PAST SURGICAL HISTORY: Hysterectomy.   SOCIAL HISTORY: No smoking or alcohol consumption. Disabled. Retired.   MEDICATIONS: Pyridostigmine 60 mg q. 6 hours, omeprazole 20 mg twice a day, Reglan 10 mg twice a day, Synthroid 100 mcg daily, Klor-Con 20 mEq a day, Glucophage 500 twice a day, Lasix 40 twice a day, diltiazem 240 a day,  B12 - 1000 mcg daily, azathioprine 50 mg 2-1/2 tablets daily, Advair Diskus 250/50 twice a day.   PHYSICAL EXAMINATION: VITAL SIGNS:  Blood pressure was 170/70, pulse of 70, respiratory rate of 20, afebrile, 94% on 2 liters.  HEENT: Normocephalic, atraumatic. Pupils equal and reactive to light.  NECK EXAM: Supple. Positive JVD bilaterally.  LUNG EXAM: Bilateral rhonchi. Diffuse crackles in the bases. Decreased air movement. Mild wheezing.  HEART EXAM: Regular rate and rhythm. A 2/6 systolic ejection murmur in the left sternal border. Positive S4. PMI nondisplaced.  ABDOMINAL EXAM: Benign.  EXTREMITY EXAM: Within normal limits.  NEUROLOGIC EXAM: Intact.  SKIN EXAM: Normal.   LABORATORIES: White count 4.5, hemoglobin 11, hematocrit 34, platelet count 286. Troponin 0.02. Sodium 130, potassium 4.5, chloride 106, bicarbonate 30, BUN 10, creatinine 0.66, glucose 87. BNP 2214. Chest x-ray: Congestive heart failure. EKG: AFib, controlled rate of 84, nonspecific findings.   ASSESSMENT: Congestive heart failure, chronic obstructive pulmonary disease, bronchospasm paroxysmal atrial fibrillation, acute on chronic respiratory failure, diabetes, obstructive sleep apnea, malignant hypertension, hypothyroidism, obesity.   PLAN:   1.  Agree with admit. Place on telemetry. Supplemental oxygen. Agree with inhalers. Agree with steroid therapy. Consider a Pulmonary consult involvement.  2. For heart failure, recommend diuresis, control blood pressure, IV Lasix. Consider ACE inhibitor. Recommend beta blockers for rate control.  3.  For atrial fibrillation, consider anticoagulation long-term. She has a significant [place] score, with no significant bleeding history, multiple medical problems.   4.  For obstructive sleep apnea, recommend CPAP therapy. I am concerned that she is not compliant with CPAP, and this could have contributed to her heart failure symptoms.   5.  Diabetes. Continue diabetes management.  Follow  fasting sugars. Continue treatment with metformin.   6.  Continue myasthenia gravis therapy.   7.  Continue treatment for gastroesophageal reflux disease.   8.  Continue hyperlipidemia therapy.   I am concerned that ultimately she needs significant weight loss to help with most of her symptoms, but will treat the patient aggressively for heart failure. An echocardiogram will be helpful. Continue diuresis. Continue supplemental oxygen. Continue inhalers and steroid therapy. Consider a Pulmonary input as necessary. I am not sure that indication for cardiac catheterization is necessary at this point. Will continue to follow the patient.    ____________________________ Loran Senters. Clayborn Bigness, MD ddc:mr D: 02/20/2014 12:20:09 ET T: 02/20/2014 19:23:48 ET JOB#: [id number]  cc: Dwayne D. Clayborn Bigness, MD, <Dictator> Yolonda Kida MD ELECTRONICALLY SIGNED 03/23/2014 12:38

## 2015-04-02 NOTE — H&P (Signed)
PATIENT NAME:  Julia Crawford, Julia Crawford MR#:  [id number] DATE OF BIRTH:  01/26/1935  DATE OF ADMISSION:  03/12/2014  PRIMARY CARE PHYSICIAN: Dr. Margarita Rana.   REFERRING PHYSICIAN: Dr. Jimmye Norman.   CHIEF COMPLAINT: Decreased urine output.   HISTORY OF PRESENT ILLNESS: Julia Crawford is a 80 year old female with a history of hypertension, diabetes mellitus, hyperlipidemia, COPD, who presented to the emergency department with decreased urine output. The patient had recent admissions for COPD exacerbation pneumonia. The patient continues to have a cough with productive sputum, shortness of breath with exertion. Concerning this, the patient is brought to the emergency department.   WORKUP IN THE EMERGENCY DEPARTMENT: The patient is found to be in acute renal failure. Per the patient, the patient was urinating well until the patient was started on lisinopril. After the lisinopril was started, the patient's kidney function significantly worsened with decreased urine output. The patient's BUN and creatinine are 36 and 3.35. At baseline the patient has a normal creatinine. The patient states has some cough with mildly productive sputum recently. No change in the medications other than adding the lisinopril. Denies having any nausea, vomiting. Denies using any over-the-counter pain medications.    PAST MEDICAL HISTORY:  1. Congestive heart failure.  2. COPD.  3. Hypothyroidism.  4. Atrial fibrillation.  5. Hyperlipidemia.  6. Myasthenia gravis.  7. Gastric ulcer.  8. Diabetes mellitus, type 2.   ALLERGIES:  1. DOXYCYCLINE. 2. CODEINE.   HOME MEDICATIONS:  1. 60 mg 1 tablet every six hours.  2. Omeprazole 20 mg 2 times a day.  3. Metoclopramide 10 mg 2 times a day.  4. Metformin 500 mg 2 times a day.  5. Lisinopril 20 mg a day.  6. Levothyroxine 100 mcg once a day.  7. Klor-Con 20 mg 1 tablet once a day.  8. Lasix 40 mg 2 times a day.  9. Diltiazem 240 mg once a day.  10. B12 at 1000 mcg 1 tablet once a  day.  11. Azathioprine 2.5 mg only once a day. 12. Apixaban 5 mg 2 times a day.  13. Albuterol/ipratropium 2.5/0.5 mg 4 times a day.  13. Advair Diskus 250 mcg 1 puff 2 times a day.   SOCIAL HISTORY: No history of smoking, drinking alcohol, or using illicit drugs.   FAMILY HISTORY: No history of coronary artery disease and stroke.   REVIEW OF SYSTEMS: CONSTITUTIONAL: Has generalized weakness.  EYES: No change in vision.  ENT: No change in hearing.  RESPIRATORY: No cough, shortness of breath.  CARDIOVASCULAR: No chest pain, palpitations.  GENITOURINARY: No dysuria or hematuria.  SKIN: No rash or lesions.  MUSCULOSKELETAL: No joint pains and aches.  NEUROLOGIC: No weakness or numbness in any part of the body.   PHYSICAL EXAMINATION:  GENERAL: This is obese female lying down in the bed, not in distress.  VITAL SIGNS: Temperature 98.7, pulse 64, blood pressure 121/59, respiratory rate of 16, oxygen saturation is 97% on room air.  HEENT: Head normocephalic, atraumatic. There is no scleral icterus. Conjunctivae normal. Pupils equal and react to light. Extraocular movements are intact. Mucous membranes moist. No pharyngeal erythema.  NECK: Supple. No lymphadenopathy. No JVD. No carotid bruit. No thyromegaly.  CHEST: Has no focal tenderness. Bilateral coarse breath sounds.  HEART: S1, S2 regular. No murmurs are heard.  ABDOMEN: Bowel sounds present. Soft, nontender, nondistended. Could not appreciate any hepatosplenomegaly.  EXTREMITIES: No pedal edema. Pulses 2+.  NEUROLOGIC: The patient is alert, oriented to place, person, and time.  Cranial nerves II through XII intact. Motor 5/5 in upper and lower extremities.   LABORATORIES: BMP: BUN 36, creatinine of 3.35, sodium 126, potassium 5.7. The rest of all the values are within normal limits.   CBC: WBC of 3.3, hemoglobin 12.3. UA: One-plus leukocyte esterase, WBC of 10.   ASSESSMENT AND PLAN: Julia Crawford is a 80 year old female who comes to  the emergency department with acute renal failure.  1. Acute renal failure. This is multifactorial. Could be from the dehydration, . Will hold both diuresis and lisinopril.  Continue with gentle hydration and follow up.  2. Hyperkalemia. Will give 1 dose of Kayexalate. 3. Hyponatremia. The patient has a sodium of 126 most likely secondary to hypovolemic hyponatremia. Will keep the patient on fluid restriction and follow up.  4. Diabetes mellitus, insulin-dependent. Continue with home medications.  5. Hypertension: Currently well controlled. Continue with home medications.  6. Keep the patient on deep vein thrombosis prophylaxis with Lovenox.   TIME SPENT: 45 minutes.    ____________________________ Monica Becton, MD pv:lt D: 03/13/2014 01:06:24 ET T: 03/13/2014 02:17:17 ET JOB#: [id number]  cc: Monica Becton, MD, <Dictator> Monica Becton MD ELECTRONICALLY SIGNED 03/25/2014 0:29

## 2015-04-02 NOTE — Discharge Summary (Signed)
PATIENT NAME:  Julia Crawford, TAUSSIG MR#:  [id number] DATE OF BIRTH:  August 18, 1935  DATE OF ADMISSION:  03/12/2014 DATE OF DISCHARGE:  03/14/2014  DISCHARGE DIAGNOSES: 1. Acute renal failure secondary to excess diureses.  2. Hyperkalemia.  3. Chronic respiratory failure.  4. Obstructive sleep apnea.  5. Chronic diastolic congestive heart failure without fluid overload.  6. Atrial fibrillation on Eliquis.  7. Chronic obstructive pulmonary disease.  8. Myasthenia gravis.  9. Diabetes mellitus.  10. Hypertension.  11. Hypothyroidism.   IMAGING STUDIES: Include a chest x-ray which showed no pulmonary edema.   ADMITTING HISTORY AND PHYSICAL: Please see detailed H and P dictated previously. In brief, a 80 year old African American female patient recently in the hospital for diastolic congestive heart failure exacerbation, started on lisinopril and Lasix, presented to the hospital complaining of weakness and decreased urine output. The patient was found to have acute renal failure without any congestive heart failure admitted to the hospitalist service.   HOSPITAL COURSE: 1. Acute renal failure with hypokalemia. The patient's Lasix and lisinopril were held. Initially, blood pressure was in the lower side in systolic of 23R, which is improved to normal range. The patient's Lasix dose has been decreased at discharge. She was on IV fluids during the hospital stay. Her creatinine is back to normal. She had mild hyperkalemia, which seems to be her baseline, but now that her lisinopril is held this should improve. The patient did not have any signs of fluid overload or exacerbation of her congestive heart failure during the hospital stay.  2. The patient's hypertension and diabetes were fairly controlled.   She was continued on her oxygen for her chronic obstructive pulmonary disease, counseled to be compliant with her CPAP.   Prior to discharge, the patient did not have any crackles on her lung examination.  Heart sounds showed S1, S2, without any murmurs. No edema in the lower extremities. No abdominal tenderness and was soft on exam.   DISCHARGE MEDICATIONS: 1. Levothyroxine 100 mcg daily.  2. B12 1000 mcg daily.  3. Azathioprine 50 mg 2.5 tablets oral once a day.  4. Pyridostigmine 60 mg oral every six hours.  5. Metoclopramide 10 mg oral 2 times a day.  6. Advair Diskus 250/5 1 puff inhaled 2 times a day.  7. Prilosec 20 mg 2 times a day.  8. Apixaban 5 mg oral 2 times a day.  9. Cardizem 240 mg extended release once a day.  10. DuoNeb 3 mL, 4 times a day as needed for wheezing, shortness of breath.  11. Metformin 500 mg oral 2 times a day.  12. Furosemide 40 mg daily.  13. Prednisone 60 mg tapered over six days.   The patient's Lasix and lisinopril have been held, along with potassium supplements being held.   DISCHARGE INSTRUCTIONS: Continue home oxygen at 2 liters continuous. Low-sodium diet, regular consistency. Activity as tolerated. Follow up with primary care physician in 1 to 2 weeks. Daily fluids less than 2 liters.   Time spent on day of discharge in discharge activity was 40 minutes.   ____________________________ Leia Alf Joanthony Hamza, MD srs:sg D: 03/17/2014 10:26:06 ET T: 03/17/2014 10:45:22 ET JOB#: [id number]  cc: Alveta Heimlich R. Darvin Neighbours, MD, <Dictator> Neita Carp MD ELECTRONICALLY SIGNED 03/23/2014 14:48

## 2015-04-02 NOTE — Discharge Summary (Signed)
PATIENT NAME:  Julia Crawford, Julia Crawford MR#:  [id number] DATE OF BIRTH:  [birth year]  DATE OF ADMISSION:  02/19/2014 DATE OF DISCHARGE:  02/26/2014  CONSULTANTS: Dr. Clayborn Bigness from cardiology, physical therapy and speech therapy.   PRIMARY CARE PHYSICIAN:  Dr. Venia Minks.   CHIEF COMPLAINT: Shortness of breath, wheezing.   DISCHARGE DIAGNOSES: 1.  Acute respiratory failure likely multifactorial with acute on chronic diastolic congestive heart failure, chronic obstructive pulmonary disease.  2.  Obstructive sleep apnea, noncompliant with CPAP.  3.  New-onset atrial fib.  4.  Chronic obstructive pulmonary disease exacerbation.  5.  History of myasthenia gravis.  6.  Diabetes.  7.  Malignant hypertension.  8.  Hypothyroidism.  9.  History of gastroesophageal reflux disease.  10.  Asthma.  11.  Vitamin B12 deficiency with anemia.  12.  Osteoporosis.   DISCHARGE MEDICATIONS: Levothyroxine 100 mcg daily, B12, 1000 mcg daily; azathioprine 50 mg 2-1/2 tabs once a day, pyridostigmine 60 mg 1 tab every 6 hours, furosemide 40 mg 2 times a day, Klor-Con 20 mEq 1 tab once a day, Glucophage 500 mg 1 tab 3 times a day, metoclopramide 10 mg 2 times a day, Advair 250/50 mcg 1 puff 2 times a day, omeprazole 20 mg once a day, diltiazem 240 mg extended release once a day, lisinopril 20 mg once a day, apixaban 5 mg 2 times a day, albuterol/ipratropium nebs 1 inhaled vial 4 times a day as needed for wheezing with a nebulizer machine, prednisone 50 mg for a day and taper by 10 mg until done in 5 days.   She will be getting discharged with home health, PT and R.N. with home oxygen via nasal cannula continuous 2 liters.   DIET: Low-sodium, low-fat, low-cholesterol ADA diet.    ACTIVITY: As tolerated.   Please follow with PCP and your cardiologist within 1 to 2 weeks.   SIGNIFICANT LABS AND IMAGING: Initial BNP was 2214. BUN of 10, creatinine of 0.66. TSH 1.96. White count of 4.5 and hemoglobin of 10.9. CHEST X-RAY: March  13: Congestive heart failure with mild interstitial edema. Interstitial edema has progressed from prior exam from July 2014. REPEAT X-RAY OF THE CHEST: March 15: Showed no acute abnormalities noted.   HISTORY OF PRESENT ILLNESS AND HOSPITAL COURSE: For full details of the H and P, please see the dictation on March 15 by Dr. Benjie Karvonen, but briefly this is a pleasant 80 year old  female with COPD, who is supposed to be on oxygen sometimes, but stopped wearing it; history of obstructive sleep apnea, diabetes, hypothyroidism, who comes in for shortness of breath and wheezing. She also had low-grade fevers and cough, and here, she was noted to have on x-ray of chest showing compatibility with congestive heart failure. She was admitted to the hospitalist service. She was also hypoxic on admission requiring oxygenation.   With respect to her acute respiratory failure, it was likely secondary to combination of CHF and COPD, given diastolic CHF in nature. She was diuresed with improvement in her respiratory status, however. She maintained significant wheeze most of the hospitalization. Given her history of myasthenia gravis, that was considered as well; however, the NIFs were within normal limits and that was ruled out. She had did have peak flows often. She was on azithromycin and initially the IV steroids were weaned off to p.o. prednisone for a quick taper given her myasthenia gravis state; however, the patient, again, became wheezy requiring IV steroids. At this point, her wheezing is significantly better and she  will be discharged with 5 days of prednisone. In regards to the acute diastolic CHF, she was slowly weaned off from IV Lasix to p.o. and, at this point, will be going back home with her outpatient dose of Lasix. She was counseled about using her CPAP machine and she was instructed to keep her oxygen on.   She did have a new onset Afib and was seen by cardiology and does have high CHADS2 score. She was started  on Eliquis per cardiology's recommendation, and she will be discharged on that. Her rate is controlled currently with Cardizem. At this point, she is significantly improved, was seen by PT, and the recommendations of home health with PT and R.N. and that has been arranged.  THE PATIENT IS LIMITED CODE; DOES NOT WANT ANY CPR OR INTUBATION, BUT DOES WANT VASOACTIVE DRUGS AND ANTIARRHYTHMIC DRUGS WITH DEFIBRILLATION.   TOTAL TIME SPENT: 40 minutes.   The patient is going to be discharged with outpatient followup as dictated above.    ____________________________ Vivien Presto, MD sa:dmm D: 02/26/2014 13:05:13 ET T: 02/26/2014 14:32:19 ET JOB#: [id number]  cc: Vivien Presto, MD, <Dictator> Dwayne D. Clayborn Bigness, MD Jerrell Belfast, MD Karel Jarvis [facility] MD ELECTRONICALLY SIGNED 03/11/2014 15:54

## 2015-04-02 NOTE — Consult Note (Signed)
Chief Complaint:  Subjective/Chief Complaint Pt asleep . Still having mild sob no cp.   VITAL SIGNS/ANCILLARY NOTES: **Vital Signs.:   15-Mar-15 04:29  Vital Signs Type Routine  Temperature Temperature (F) 98.1  Celsius 36.7  Temperature Source oral  Pulse Pulse 91  Respirations Respirations 18  Systolic BP Systolic BP 681  Diastolic BP (mmHg) Diastolic BP (mmHg) 79  Mean BP 96  Pulse Ox % Pulse Ox % 95  Pulse Ox Activity Level  At rest  Oxygen Delivery 4L  *Intake and Output.:   Daily 15-Mar-15 07:00  Grand Totals Intake:  720 Output:  1500    Net:  -780 24 Hr.:  -780  Oral Intake      In:  720  Urine ml     Out:  1500  Length of Stay Totals Intake:  1120 Output:  2650    Net:  -1530   Brief Assessment:  GEN well developed, well nourished, no acute distress   Cardiac Irregular  murmur present  -- LE edema  -- JVD   Respiratory normal resp effort   Gastrointestinal Normal   Gastrointestinal details normal Soft   EXTR positive edema   Lab Results: Routine Chem:  15-Mar-15 04:08   Glucose, Serum  207  BUN  31  Creatinine (comp) 1.26  Sodium, Serum 138  Potassium, Serum 4.2  Chloride, Serum 99  CO2, Serum  34  Calcium (Total), Serum  8.1  Anion Gap  5  Osmolality (calc) 288  eGFR (African American)  48  eGFR (Non-African American)  41 (eGFR values <47m/min/1.73 m2 may be an indication of chronic kidney disease (CKD). Calculated eGFR is useful in patients with stable renal function. The eGFR calculation will not be reliable in acutely ill patients when serum creatinine is changing rapidly. It is not useful in  patients on dialysis. The eGFR calculation may not be applicable to patients at the low and high extremes of body sizes, pregnant women, and vegetarians.)  Routine Hem:  15-Mar-15 04:08   WBC (CBC) 7.0  RBC (CBC)  3.01  Hemoglobin (CBC)  10.2  Hematocrit (CBC)  30.6  Platelet Count (CBC) 277  MCV  102  MCH 33.7  MCHC 33.2  RDW  16.9   Neutrophil % 93.6  Lymphocyte % 1.8  Monocyte % 4.6  Eosinophil % 0.0  Basophil % 0.0  Neutrophil # 6.5  Lymphocyte #  0.1  Monocyte # 0.3  Eosinophil # 0.0  Basophil # 0.0 (Result(s) reported on 21 Feb 2014 at 05:15AM.)   Radiology Results: XRay:    13-Mar-15 16:41, Chest Portable Single View  Chest Portable Single View   REASON FOR EXAM:    shortness of breath  COMMENTS:       PROCEDURE: DXR - DXR PORTABLE CHEST SINGLE VIEW  - Feb 19 2014  4:41PM     CLINICAL DATA:  Shortness of breath.    EXAM:  PORTABLE CHEST - 1 VIEW    COMPARISON:  Chest x-ray 06/24/2013.    FINDINGS:  Cardiomegaly with pulmonary vascular prominence. Increased  interstitial markings are noted from prior exam. Findings consistent  with congestive heart failure. No pleural effusion or pneumothorax  noted. No acute osseous abnormality.     IMPRESSION:  Congestive heart failure with mild interstitial edema. Interstitial  edema has progressed from prior exam.      Electronically Signed    By: T[place]a   On: 02/19/2014 16:45  Verified By: Osa Craver, M.D., MD    15-Mar-15 09:03, Chest Portable Single View  Chest Portable Single View   REASON FOR EXAM:    sob.  COMMENTS:       PROCEDURE: DXR - DXR PORTABLE CHEST SINGLE VIEW  - Feb 21 2014  9:03AM     CLINICAL DATA:  Shortness of breath    EXAM:  PORTABLE CHEST - 1 VIEW    COMPARISON:  02/19/2014    FINDINGS:  Cardiomegaly is again seen. The lungs are well aerated bilaterally.  No focal infiltrate or sizable effusion is noted.   IMPRESSION:  No acute abnormality noted.      Electronically Signed    By: Inez Catalina M.D.    On: 02/21/2014 09:03         Verified By: Everlene Farrier, M.D.,  Cardiology:    13-Mar-15 16:04, ED ECG  Ventricular Rate 84  Atrial Rate 102  QRS Duration 128  QT 384  QTc 453  R Axis -58  T Axis 117  ECG interpretation   Atrial fibrillation  Left axis  deviation  Non-specific intra-ventricular conduction block  Possible Anterolateral infarct (cited on or before 21-Jun-2013)  Abnormal ECG  When compared with ECG of 21-Jun-2013 22:01,  Atrial fibrillation has replaced Sinus rhythm  Vent. rate has increased BY  32 BPM  QRS axis Shifted left  Questionable change in initial forces of Lateral leads  ----------unconfirmed----------  Confirmed by OVERREAD, NOT ([id number]), editor PEARSON, BARBARA ([id number]) on 02/20/2014 9:13:25 AM  ED ECG    Assessment/Plan:  Assessment/Plan:  Assessment IMP CHF SOB MG OSA HTN Hyperlipidemia DM .   Plan PLAN IV Lasix 02 CPAP Bp control ROMI Wgt loss and exerise Rec Heart rate control Anticoug long term Statin therapy   Electronic Signatures: Lujean Amel D (MD)  (Signed 16-Mar-15 07:47)  Authored: Chief Complaint, VITAL SIGNS/ANCILLARY NOTES, Brief Assessment, Lab Results, Radiology Results, Assessment/Plan   Last Updated: 16-Mar-15 07:47 by Yolonda Kida (MD)

## 2015-04-07 DIAGNOSIS — G4733 Obstructive sleep apnea (adult) (pediatric): Secondary | ICD-10-CM | POA: Diagnosis not present

## 2015-04-13 DIAGNOSIS — J449 Chronic obstructive pulmonary disease, unspecified: Secondary | ICD-10-CM | POA: Diagnosis not present

## 2015-04-13 DIAGNOSIS — R0609 Other forms of dyspnea: Secondary | ICD-10-CM | POA: Diagnosis not present

## 2015-04-13 DIAGNOSIS — G4733 Obstructive sleep apnea (adult) (pediatric): Secondary | ICD-10-CM | POA: Diagnosis not present

## 2015-04-14 ENCOUNTER — Encounter: Payer: Self-pay | Admitting: Emergency Medicine

## 2015-04-14 ENCOUNTER — Emergency Department: Payer: Medicare Other

## 2015-04-14 ENCOUNTER — Other Ambulatory Visit: Payer: Self-pay | Admitting: Physician Assistant

## 2015-04-14 ENCOUNTER — Inpatient Hospital Stay
Admission: EM | Admit: 2015-04-14 | Discharge: 2015-04-16 | DRG: 291 | Disposition: A | Discharge status: Home Health | Payer: Medicare Other | Attending: Specialist | Admitting: Specialist

## 2015-04-14 DIAGNOSIS — G4733 Obstructive sleep apnea (adult) (pediatric): Secondary | ICD-10-CM | POA: Diagnosis present

## 2015-04-14 DIAGNOSIS — Z79899 Other long term (current) drug therapy: Secondary | ICD-10-CM | POA: Diagnosis not present

## 2015-04-14 DIAGNOSIS — R7989 Other specified abnormal findings of blood chemistry: Secondary | ICD-10-CM | POA: Diagnosis not present

## 2015-04-14 DIAGNOSIS — R0602 Shortness of breath: Secondary | ICD-10-CM | POA: Diagnosis not present

## 2015-04-14 DIAGNOSIS — I4891 Unspecified atrial fibrillation: Secondary | ICD-10-CM | POA: Diagnosis not present

## 2015-04-14 DIAGNOSIS — Z885 Allergy status to narcotic agent status: Secondary | ICD-10-CM

## 2015-04-14 DIAGNOSIS — I25119 Atherosclerotic heart disease of native coronary artery with unspecified angina pectoris: Secondary | ICD-10-CM | POA: Diagnosis not present

## 2015-04-14 DIAGNOSIS — I482 Chronic atrial fibrillation: Secondary | ICD-10-CM | POA: Diagnosis not present

## 2015-04-14 DIAGNOSIS — E119 Type 2 diabetes mellitus without complications: Secondary | ICD-10-CM | POA: Diagnosis present

## 2015-04-14 DIAGNOSIS — Z7982 Long term (current) use of aspirin: Secondary | ICD-10-CM

## 2015-04-14 DIAGNOSIS — G7000 Myasthenia gravis without (acute) exacerbation: Secondary | ICD-10-CM | POA: Diagnosis present

## 2015-04-14 DIAGNOSIS — I248 Other forms of acute ischemic heart disease: Secondary | ICD-10-CM | POA: Diagnosis present

## 2015-04-14 DIAGNOSIS — K219 Gastro-esophageal reflux disease without esophagitis: Secondary | ICD-10-CM | POA: Diagnosis present

## 2015-04-14 DIAGNOSIS — I509 Heart failure, unspecified: Secondary | ICD-10-CM | POA: Diagnosis not present

## 2015-04-14 DIAGNOSIS — Z9114 Patient's other noncompliance with medication regimen: Secondary | ICD-10-CM | POA: Diagnosis not present

## 2015-04-14 DIAGNOSIS — I480 Paroxysmal atrial fibrillation: Secondary | ICD-10-CM | POA: Diagnosis not present

## 2015-04-14 DIAGNOSIS — J9601 Acute respiratory failure with hypoxia: Secondary | ICD-10-CM | POA: Diagnosis present

## 2015-04-14 DIAGNOSIS — D649 Anemia, unspecified: Secondary | ICD-10-CM | POA: Diagnosis present

## 2015-04-14 DIAGNOSIS — I2510 Atherosclerotic heart disease of native coronary artery without angina pectoris: Secondary | ICD-10-CM | POA: Diagnosis not present

## 2015-04-14 DIAGNOSIS — J9600 Acute respiratory failure, unspecified whether with hypoxia or hypercapnia: Secondary | ICD-10-CM | POA: Diagnosis not present

## 2015-04-14 DIAGNOSIS — Z8249 Family history of ischemic heart disease and other diseases of the circulatory system: Secondary | ICD-10-CM | POA: Diagnosis not present

## 2015-04-14 DIAGNOSIS — M1990 Unspecified osteoarthritis, unspecified site: Secondary | ICD-10-CM | POA: Diagnosis present

## 2015-04-14 DIAGNOSIS — R600 Localized edema: Secondary | ICD-10-CM | POA: Diagnosis not present

## 2015-04-14 DIAGNOSIS — I5023 Acute on chronic systolic (congestive) heart failure: Secondary | ICD-10-CM | POA: Diagnosis not present

## 2015-04-14 DIAGNOSIS — E039 Hypothyroidism, unspecified: Secondary | ICD-10-CM | POA: Diagnosis not present

## 2015-04-14 DIAGNOSIS — R609 Edema, unspecified: Secondary | ICD-10-CM

## 2015-04-14 DIAGNOSIS — J449 Chronic obstructive pulmonary disease, unspecified: Secondary | ICD-10-CM | POA: Diagnosis present

## 2015-04-14 DIAGNOSIS — I10 Essential (primary) hypertension: Secondary | ICD-10-CM | POA: Diagnosis not present

## 2015-04-14 HISTORY — DX: Atherosclerotic heart disease of native coronary artery without angina pectoris: I25.10

## 2015-04-14 HISTORY — DX: Myasthenia gravis without (acute) exacerbation: G70.00

## 2015-04-14 HISTORY — DX: Type 2 diabetes mellitus without complications: E11.9

## 2015-04-14 HISTORY — DX: Hypothyroidism, unspecified: E03.9

## 2015-04-14 HISTORY — DX: Unspecified atrial fibrillation: I48.91

## 2015-04-14 HISTORY — DX: Chronic obstructive pulmonary disease, unspecified: J44.9

## 2015-04-14 HISTORY — DX: Cardiac arrhythmia, unspecified: I49.9

## 2015-04-14 HISTORY — DX: Unspecified osteoarthritis, unspecified site: M19.90

## 2015-04-14 LAB — CBC WITH DIFFERENTIAL/PLATELET
BASOS ABS: 0 10*3/uL (ref 0–0.1)
BASOS PCT: 1 %
EOS PCT: 3 %
Eosinophils Absolute: 0.1 10*3/uL (ref 0–0.7)
HCT: 34.1 % — ABNORMAL LOW (ref 35.0–47.0)
Hemoglobin: 10.9 g/dL — ABNORMAL LOW (ref 12.0–16.0)
Lymphocytes Relative: 14 %
Lymphs Abs: 0.6 10*3/uL — ABNORMAL LOW (ref 1.0–3.6)
MCH: 32.5 pg (ref 26.0–34.0)
MCHC: 31.8 g/dL — AB (ref 32.0–36.0)
MCV: 102 fL — ABNORMAL HIGH (ref 80.0–100.0)
Monocytes Absolute: 0.6 10*3/uL (ref 0.2–0.9)
Monocytes Relative: 13 %
NEUTROS ABS: 2.9 10*3/uL (ref 1.4–6.5)
NEUTROS PCT: 69 %
PLATELETS: 286 10*3/uL (ref 150–440)
RBC: 3.35 MIL/uL — AB (ref 3.80–5.20)
RDW: 19 % — ABNORMAL HIGH (ref 11.5–14.5)
WBC: 4.2 10*3/uL (ref 3.6–11.0)

## 2015-04-14 LAB — CBC
HCT: 34.8 % — ABNORMAL LOW (ref 35.0–47.0)
HEMOGLOBIN: 11.3 g/dL — AB (ref 12.0–16.0)
MCH: 33 pg (ref 26.0–34.0)
MCHC: 32.6 g/dL (ref 32.0–36.0)
MCV: 101.4 fL — ABNORMAL HIGH (ref 80.0–100.0)
Platelets: 272 10*3/uL (ref 150–440)
RBC: 3.44 MIL/uL — ABNORMAL LOW (ref 3.80–5.20)
RDW: 19.3 % — AB (ref 11.5–14.5)
WBC: 4.8 10*3/uL (ref 3.6–11.0)

## 2015-04-14 LAB — URINALYSIS COMPLETE WITH MICROSCOPIC (ARMC ONLY)
Bilirubin Urine: NEGATIVE
GLUCOSE, UA: NEGATIVE mg/dL
Hgb urine dipstick: NEGATIVE
Nitrite: NEGATIVE
Protein, ur: 100 mg/dL — AB
Specific Gravity, Urine: 1.032 — ABNORMAL HIGH (ref 1.005–1.030)
pH: 5 (ref 5.0–8.0)

## 2015-04-14 LAB — COMPREHENSIVE METABOLIC PANEL
ALBUMIN: 3.9 g/dL (ref 3.5–5.0)
ALK PHOS: 50 U/L (ref 38–126)
ALT: 9 U/L — ABNORMAL LOW (ref 14–54)
ANION GAP: 8 (ref 5–15)
AST: 19 U/L (ref 15–41)
BILIRUBIN TOTAL: 1 mg/dL (ref 0.3–1.2)
BUN: 12 mg/dL (ref 6–20)
CALCIUM: 9 mg/dL (ref 8.9–10.3)
CO2: 27 mmol/L (ref 22–32)
CREATININE: 0.62 mg/dL (ref 0.44–1.00)
Chloride: 104 mmol/L (ref 101–111)
GFR calc Af Amer: >60 mL/min (ref >60)
GFR calc non Af Amer: >60 mL/min (ref >60)
Glucose, Bld: 133 mg/dL — ABNORMAL HIGH (ref 65–99)
Potassium: 4.3 mmol/L (ref 3.5–5.1)
Sodium: 139 mmol/L (ref 135–145)
Total Protein: 7.3 g/dL (ref 6.5–8.1)

## 2015-04-14 LAB — TROPONIN I
TROPONIN I: 0.03 ng/mL (ref <0.031)
TROPONIN I: 0.03 ng/mL (ref <0.031)
Troponin I: 0.03 ng/mL (ref <0.031)

## 2015-04-14 LAB — BRAIN NATRIURETIC PEPTIDE: B Natriuretic Peptide: 707 pg/mL — ABNORMAL HIGH (ref 0.0–100.0)

## 2015-04-14 LAB — GLUCOSE, CAPILLARY
GLUCOSE-CAPILLARY: 162 mg/dL — AB (ref 70–99)
GLUCOSE-CAPILLARY: 135 mg/dL — AB (ref 70–99)

## 2015-04-14 LAB — CREATININE, SERUM
Creatinine, Ser: 0.74 mg/dL (ref 0.44–1.00)
GFR calc Af Amer: >60 mL/min (ref >60)
GFR calc non Af Amer: >60 mL/min (ref >60)

## 2015-04-14 MED ORDER — PANTOPRAZOLE SODIUM 40 MG PO TBEC
40.0000 mg | DELAYED_RELEASE_TABLET | Freq: Every day | ORAL | Status: DC
  Administered 2015-04-14 – 2015-04-16 (×3): 40 mg via ORAL
  Filled 2015-04-14 (×3): qty 1

## 2015-04-14 MED ORDER — LEVOTHYROXINE SODIUM 100 MCG PO TABS
100.0000 ug | ORAL_TABLET | Freq: Every day | ORAL | Status: DC
Start: 2015-04-15 — End: 2015-04-16
  Administered 2015-04-15 – 2015-04-16 (×2): 100 ug via ORAL
  Filled 2015-04-14 (×2): qty 1

## 2015-04-14 MED ORDER — SODIUM CHLORIDE 0.9 % IJ SOLN
3.0000 mL | Freq: Two times a day (BID) | INTRAMUSCULAR | Status: DC
  Administered 2015-04-14: 3 mL via INTRAVENOUS

## 2015-04-14 MED ORDER — FUROSEMIDE 10 MG/ML IJ SOLN
20.0000 mg | Freq: Two times a day (BID) | INTRAMUSCULAR | Status: DC
  Administered 2015-04-14 – 2015-04-16 (×4): 20 mg via INTRAVENOUS
  Filled 2015-04-14 (×4): qty 2

## 2015-04-14 MED ORDER — SIMVASTATIN 10 MG PO TABS
10.0000 mg | ORAL_TABLET | Freq: Every day | ORAL | Status: DC
  Administered 2015-04-14 – 2015-04-15 (×2): 10 mg via ORAL
  Filled 2015-04-14 (×2): qty 1

## 2015-04-14 MED ORDER — DILTIAZEM HCL ER 240 MG PO CP24
240.0000 mg | ORAL_CAPSULE | Freq: Every day | ORAL | Status: DC
  Administered 2015-04-14 – 2015-04-16 (×3): 240 mg via ORAL
  Filled 2015-04-14 (×7): qty 1

## 2015-04-14 MED ORDER — FUROSEMIDE 10 MG/ML IJ SOLN
80.0000 mg | Freq: Once | INTRAMUSCULAR | Status: AC
  Administered 2015-04-14: 80 mg via INTRAVENOUS

## 2015-04-14 MED ORDER — AZATHIOPRINE 50 MG PO TABS
125.0000 mg | ORAL_TABLET | Freq: Every day | ORAL | Status: DC
  Administered 2015-04-15 – 2015-04-16 (×2): 125 mg via ORAL
  Filled 2015-04-14 (×4): qty 3

## 2015-04-14 MED ORDER — METHYLPREDNISOLONE SODIUM SUCC 125 MG IJ SOLR
125.0000 mg | Freq: Once | INTRAMUSCULAR | Status: AC
  Administered 2015-04-14: 125 mg via INTRAVENOUS

## 2015-04-14 MED ORDER — IPRATROPIUM BROMIDE 0.02 % IN SOLN
RESPIRATORY_TRACT | Status: AC
  Administered 2015-04-14: 0.5 mg via RESPIRATORY_TRACT
  Filled 2015-04-14: qty 2.5

## 2015-04-14 MED ORDER — ALBUTEROL SULFATE (2.5 MG/3ML) 0.083% IN NEBU
INHALATION_SOLUTION | RESPIRATORY_TRACT | Status: AC
  Administered 2015-04-14: 2.5 mg via RESPIRATORY_TRACT
  Filled 2015-04-14: qty 3

## 2015-04-14 MED ORDER — MOMETASONE FURO-FORMOTEROL FUM 100-5 MCG/ACT IN AERO
2.0000 | INHALATION_SPRAY | Freq: Two times a day (BID) | RESPIRATORY_TRACT | Status: DC
  Administered 2015-04-14 – 2015-04-16 (×4): 2 via RESPIRATORY_TRACT
  Filled 2015-04-14: qty 8.8

## 2015-04-14 MED ORDER — METFORMIN HCL 500 MG PO TABS
500.0000 mg | ORAL_TABLET | Freq: Three times a day (TID) | ORAL | Status: DC
  Administered 2015-04-14 – 2015-04-16 (×6): 500 mg via ORAL
  Filled 2015-04-14 (×6): qty 1

## 2015-04-14 MED ORDER — SODIUM CHLORIDE 0.9 % IJ SOLN
3.0000 mL | INTRAMUSCULAR | Status: DC | PRN
Start: 2015-04-14 — End: 2015-04-16

## 2015-04-14 MED ORDER — FUROSEMIDE 10 MG/ML IJ SOLN
INTRAMUSCULAR | Status: AC
  Administered 2015-04-14: 80 mg via INTRAVENOUS
  Filled 2015-04-14: qty 4

## 2015-04-14 MED ORDER — HEPARIN SODIUM (PORCINE) 5000 UNIT/ML IJ SOLN
5000.0000 [IU] | Freq: Three times a day (TID) | INTRAMUSCULAR | Status: DC
  Administered 2015-04-14 – 2015-04-16 (×5): 5000 [IU] via SUBCUTANEOUS
  Filled 2015-04-14 (×5): qty 1

## 2015-04-14 MED ORDER — LISINOPRIL 5 MG PO TABS
5.0000 mg | ORAL_TABLET | Freq: Every day | ORAL | Status: DC
  Administered 2015-04-14 – 2015-04-16 (×3): 5 mg via ORAL
  Filled 2015-04-14 (×4): qty 1

## 2015-04-14 MED ORDER — ASPIRIN EC 81 MG PO TBEC
81.0000 mg | DELAYED_RELEASE_TABLET | Freq: Every day | ORAL | Status: DC
  Administered 2015-04-14 – 2015-04-16 (×3): 81 mg via ORAL
  Filled 2015-04-14 (×3): qty 1

## 2015-04-14 MED ORDER — PYRIDOSTIGMINE BROMIDE 60 MG PO TABS
60.0000 mg | ORAL_TABLET | Freq: Four times a day (QID) | ORAL | Status: DC
  Administered 2015-04-14 – 2015-04-16 (×8): 60 mg via ORAL
  Filled 2015-04-14 (×9): qty 1

## 2015-04-14 MED ORDER — FUROSEMIDE 10 MG/ML IJ SOLN
INTRAMUSCULAR | Status: AC
  Filled 2015-04-14: qty 10

## 2015-04-14 MED ORDER — INSULIN ASPART 100 UNIT/ML ~~LOC~~ SOLN
0.0000 [IU] | Freq: Three times a day (TID) | SUBCUTANEOUS | Status: DC
  Administered 2015-04-14: 2 [IU] via SUBCUTANEOUS
  Administered 2015-04-15: 1 [IU] via SUBCUTANEOUS
  Filled 2015-04-14: qty 1
  Filled 2015-04-14: qty 2

## 2015-04-14 MED ORDER — METHYLPREDNISOLONE SODIUM SUCC 125 MG IJ SOLR
INTRAMUSCULAR | Status: AC
  Administered 2015-04-14: 125 mg via INTRAVENOUS
  Filled 2015-04-14: qty 2

## 2015-04-14 MED ORDER — ALBUTEROL SULFATE (2.5 MG/3ML) 0.083% IN NEBU
2.5000 mg | INHALATION_SOLUTION | Freq: Once | RESPIRATORY_TRACT | Status: AC
  Administered 2015-04-14: 2.5 mg via RESPIRATORY_TRACT

## 2015-04-14 MED ORDER — IPRATROPIUM BROMIDE 0.02 % IN SOLN
0.5000 mg | Freq: Once | RESPIRATORY_TRACT | Status: AC
  Administered 2015-04-14: 0.5 mg via RESPIRATORY_TRACT

## 2015-04-14 MED ORDER — VITAMIN B-12 1000 MCG PO TABS
1000.0000 ug | ORAL_TABLET | Freq: Every day | ORAL | Status: DC
  Administered 2015-04-14 – 2015-04-16 (×3): 1000 ug via ORAL
  Filled 2015-04-14 (×3): qty 1

## 2015-04-14 MED ORDER — DOCUSATE SODIUM 100 MG PO CAPS
100.0000 mg | ORAL_CAPSULE | Freq: Two times a day (BID) | ORAL | Status: DC
  Administered 2015-04-14 – 2015-04-16 (×4): 100 mg via ORAL
  Filled 2015-04-14 (×5): qty 1

## 2015-04-14 MED ORDER — METOCLOPRAMIDE HCL 10 MG PO TABS
10.0000 mg | ORAL_TABLET | Freq: Two times a day (BID) | ORAL | Status: DC
  Administered 2015-04-14 – 2015-04-16 (×4): 10 mg via ORAL
  Filled 2015-04-14 (×4): qty 1

## 2015-04-14 NOTE — ED Notes (Signed)
Patient transported to X-ray 

## 2015-04-14 NOTE — ED Notes (Addendum)
Pt sitting up in bed, states that she is unable to lie flat due to increased SOB, unable to lie flat while at home as well. Color WNL, RR increased, increased effort per patient family. No cough present, pt denies.

## 2015-04-14 NOTE — H&P (Signed)
[place] [facility] [place] NAME: Julia Crawford    MR#:  [id number]  DATE OF BIRTH:  September 21, 1935  DATE OF ADMISSION:  04/14/2015  PRIMARY CARE PHYSICIAN: Margarita Rana, MD , cardiologist- Dr. Clayborn Bigness.  REQUESTING/REFERRING PHYSICIAN: Dr.Gottlieb  CHIEF COMPLAINT:   Chief Complaint  Patient presents with  . Respiratory Distress    HISTORY OF PRESENT ILLNESS: Julia Crawford  is a 80 y.o. female with a known history of CHF, A fib, COPD, Htn, DM came to ER as for last few months she has worsening complain of Weakness and Shortenss of breath on minimal exertion. Sleeps with 2 pillows and uses C pap at night. Some swelling on both ankles. No chest pain or cough. No fever. Getting worse- and now barely can walk in house from one room to other.  In ER- noted hypoxic 88% and Edema on Xray chest.  PAST MEDICAL HISTORY:   Past Medical History  Diagnosis Date  . Hypertension   . Coronary artery disease   . COPD (chronic obstructive pulmonary disease)   . Arthritis   . Diabetes mellitus without complication   . Myasthenia gravis   . Hypothyroid   . Irregular heart beat   . CHF (congestive heart failure) 04/14/2015  . Atrial fibrillation 04/14/2015    PAST SURGICAL HISTORY:  Past Surgical History  Procedure Laterality Date  . Tonsillectomy      SOCIAL HISTORY:  History  Substance Use Topics  . Smoking status: Never Smoker   . Smokeless tobacco: Not on file  . Alcohol Use: No   Lives with daughter- have a walker , but no energy to use it.  FAMILY HISTORY:  Family History  Problem Relation Age of Onset  . CAD Mother   . Diabetes Father   . CAD Brother   . Diabetes Brother     DRUG ALLERGIES:  Allergies  Allergen Reactions  . Codeine Itching and Swelling    REVIEW OF SYSTEMS:   CONSTITUTIONAL: No fever, fatigue , have generalized weakness.  EYES: No blurred or double vision.  EARS, NOSE, AND THROAT: No tinnitus or ear pain.   RESPIRATORY: No cough, positive shortness of breath at night and on minimal exertion , no wheezing or hemoptysis.  CARDIOVASCULAR: No chest pain, positive orthopnea & edema. No palpitation GASTROINTESTINAL: No nausea, vomiting, diarrhea or abdominal pain.  GENITOURINARY: No dysuria, hematuria.  ENDOCRINE: No polyuria, nocturia,  HEMATOLOGY: No anemia, easy bruising or bleeding SKIN: No rash or lesion. MUSCULOSKELETAL: No joint pain or arthritis.   NEUROLOGIC: No tingling, numbness, weakness.  PSYCHIATRY: No anxiety or depression.   MEDICATIONS AT HOME:  Prior to Admission medications   Medication Sig Start Date End Date Taking? Authorizing Provider  aspirin EC 81 MG tablet Take 81 mg by mouth daily.   Yes Historical Provider, MD  azaTHIOprine (IMURAN) 50 MG tablet Take 125 mg by mouth daily.   Yes Historical Provider, MD  Cyanocobalamin 1000 MCG TBCR Take 1 tablet by mouth daily.   Yes Historical Provider, MD  diltiazem (DILACOR XR) 240 MG 24 hr capsule Take 240 mg by mouth daily.   Yes Historical Provider, MD  Fluticasone-Salmeterol (ADVAIR) 250-50 MCG/DOSE AEPB Inhale 1 puff into the lungs 2 (two) times daily.   Yes Historical Provider, MD  furosemide (LASIX) 40 MG tablet Take 40 mg by mouth daily.   Yes Historical Provider, MD  levothyroxine (SYNTHROID, LEVOTHROID) 100 MCG tablet Take 100 mcg by mouth daily before  breakfast.   Yes Historical Provider, MD  metFORMIN (GLUCOPHAGE) 500 MG tablet Take 500 mg by mouth 3 (three) times daily.   Yes Historical Provider, MD  metoCLOPramide (REGLAN) 10 MG tablet Take 10 mg by mouth 2 (two) times daily.   Yes Historical Provider, MD  omeprazole (PRILOSEC OTC) 20 MG tablet Take 20 mg by mouth daily.   Yes Historical Provider, MD  pyridostigmine (MESTINON) 60 MG tablet Take 60 mg by mouth every 6 (six) hours.   Yes Historical Provider, MD  simvastatin (ZOCOR) 10 MG tablet Take 10 mg by mouth daily.   Yes Historical Provider, MD      PHYSICAL  EXAMINATION:   VITAL SIGNS: Blood pressure 152/93, pulse 85, temperature 98.2 F (36.8 C), temperature source Oral, resp. rate 21, height 5' (1.524 m), weight 181 lb (82.101 kg), SpO2 92 %.  GENERAL:  80 y.o.-year-old patient lying in the bed with no acute distress.  EYES: Pupils equal, round, reactive to light and accommodation. No scleral icterus. Extraocular muscles intact.  HEENT: Head atraumatic, normocephalic. Oropharynx and nasopharynx clear.  NECK:  Supple, no jugular venous distention. No thyroid enlargement, no tenderness.  LUNGS: Normal breath sounds bilaterally, no wheezing, crepitation present b/l . No use of accessory muscles of respiration. Requiring 2 ltr oxygen now. CARDIOVASCULAR: S1, S2 normal. Systolic murmurs present , ABDOMEN: Soft, nontender, nondistended. Bowel sounds present. No organomegaly or mass.  EXTREMITIES: positive pedal edema, no cyanosis, or clubbing.  NEUROLOGIC: Cranial nerves II through XII are intact. Muscle strength 5/5 in all extremities. Sensation intact. Gait not checked.  PSYCHIATRIC: The patient is alert and oriented x 3.  SKIN: No obvious rash, lesion, or ulcer.   LABORATORY PANEL:   CBC  Recent Labs Lab 04/14/15 0840  WBC 4.2  HGB 10.9*  HCT 34.1*  PLT 286  MCV 102.0*  MCH 32.5  MCHC 31.8*  RDW 19.0*  LYMPHSABS 0.6*  MONOABS 0.6  EOSABS 0.1  BASOSABS 0.0   ------------------------------------------------------------------------------------------------------------------  Chemistries   Recent Labs Lab 04/14/15 0840  NA 139  K 4.3  CL 104  CO2 27  GLUCOSE 133*  BUN 12  CREATININE 0.62  CALCIUM 9.0  AST 19  ALT 9*  ALKPHOS 50  BILITOT 1.0   ------------------------------------------------------------------------------------------------------------------ estimated creatinine clearance is 55 mL/min (by C-G formula based on Cr of  0.62). ------------------------------------------------------------------------------------------------------------------ No results for input(s): TSH, T4TOTAL, T3FREE, THYROIDAB in the last 72 hours.  Invalid input(s): FREET3   Coagulation profile No results for input(s): INR, PROTIME in the last 168 hours. ------------------------------------------------------------------------------------------------------------------- No results for input(s): DDIMER in the last 72 hours. -------------------------------------------------------------------------------------------------------------------  Cardiac Enzymes  Recent Labs Lab 04/14/15 0840  TROPONINI <0.03   ------------------------------------------------------------------------------------------------------------------ Invalid input(s): POCBNP  ---------------------------------------------------------------------------------------------------------------  Urinalysis    Component Value Date/Time   COLORURINE AMBER* 04/14/2015 0920   APPEARANCEUR CLEAR* 04/14/2015 0920   LABSPEC 1.032* 04/14/2015 0920   PHURINE 5.0 04/14/2015 0920   GLUCOSEU NEGATIVE 04/14/2015 0920   HGBUR NEGATIVE 04/14/2015 0920   BILIRUBINUR NEGATIVE 04/14/2015 0920   KETONESUR TRACE* 04/14/2015 0920   PROTEINUR 100* 04/14/2015 0920   NITRITE NEGATIVE 04/14/2015 0920   LEUKOCYTESUR TRACE* 04/14/2015 0920     RADIOLOGY: Dg Chest 2 View  04/14/2015   CLINICAL DATA:  Progressive shortness of breath  EXAM: CHEST  2 VIEW  COMPARISON:  February 21, 2014  FINDINGS: There is persistent elevation the right hemidiaphragm. There is mild generalized interstitial edema. There is cardiomegaly with pulmonary venous hypertension. No airspace consolidation.  No appreciable adenopathy. There is degenerative change in the thoracic spine.  IMPRESSION: Findings indicative of a degree of congestive heart failure.   Electronically Signed   By: Lowella Grip III M.D.   On:  04/14/2015 09:14    EKG: Orders placed or performed during the hospital encounter of 04/14/15  . ED EKG  . ED EKG    IMPRESSION AND PLAN:  * Acute respiratory failure-   Secondary to CHF, requiring supplemental oxygen.  * CHF exacerbation   Not known EF, Will get Echo   IV lasix and monitor I/O   Tele, and troponin.  * A fib   On cardizem CD- rate controlled   Not on anticoagulants due to interactions with her Myasthenia meds.   Stable issue- Will wait for cardio consult.  * Htn   Will be on IV lasix, cardizem.    Add Lisinopril.  * DM   Cont Metformin   ISS with coverage.  * Myasthenia Gravis    Cont home meds.   All the records are reviewed and case discussed with ED provider. Management plans discussed with the patient, family and they are in agreement.  CODE STATUS:    Code Status Orders        Start     Ordered   04/14/15 1358  Full code   Continuous     04/14/15 1401       TOTAL TIME TAKING CARE OF THIS PATIENT: 50 minutes.    Vaughan Basta M.D on 04/14/2015 at 2:08 PM  Between 7am to 6pm - Pager - [phone]  After 6pm go to [email or web address] - password [email or web address]  Office  [phone]  CC: Primary care physician; Margarita Rana, MD

## 2015-04-14 NOTE — ED Notes (Signed)
Difficulty breathing , worsening x2 months , denies pain

## 2015-04-14 NOTE — ED Provider Notes (Signed)
[facility] Emergency Department Provider Note  ____________________________________________  Time seen: 8:27 AM   I have reviewed the triage vital signs and the nursing notes. PMD Dr. Venia Minks. He is being interviewed in the emergency department and presence of her daughter.  HISTORY  Chief Complaint Respiratory Distress       HPI Julia Crawford is a 80 y.o. female presents to the emergency department with a chief complaint of difficulty breathing/shortness of breath which she states has been worse for the past 2 days. Patient states that she is unable to lie flat in the bed due to her shortness of breath. She also notes some additional swelling to the lower extremities. She does feel his shortness of breath is worse on exertion.  She has used her inhaler at home without significant improvement in her symptoms.  She has no cough no fever no sputum. She has no chest pain.  He has a past medical history that is significant for myasthenia gravis for which she is followed by Dr. Nadara Mustard at [facility]. She also has hypertension CAD COPD and diabetes.       Past Medical History  Diagnosis Date  . Hypertension   . Coronary artery disease   . COPD (chronic obstructive pulmonary disease)   . Arthritis   . Diabetes mellitus without complication   . Myasthenia gravis   . Hypothyroid   . Irregular heart beat   . CHF (congestive heart failure) 04/14/2015  . Atrial fibrillation 04/14/2015  MYASTHENIA GRAVIS DIABETES MELLITIS   Patient Active Problem List   Diagnosis Date Noted  . CHF (congestive heart failure) 04/14/2015  . Atrial fibrillation 04/14/2015    Past Surgical History  Procedure Laterality Date  . Tonsillectomy      No current outpatient prescriptions on file.  Allergies Codeine  Family History  Problem Relation Age of Onset  . CAD Mother   . Diabetes Father   . CAD Brother   . Diabetes Brother     Social History History   Substance Use Topics  . Smoking status: Never Smoker   . Smokeless tobacco: Not on file  . Alcohol Use: No    Review of Systems  Constitutional: Negative for fever. Eyes: Negative for visual changes. ENT: Negative for sore throat. Cardiovascular: Negative for chest pain. Respiratory: Positive for shortness of breath. Gastrointestinal: Negative for abdominal pain, vomiting and diarrhea. Genitourinary: Negative for dysuria. Musculoskeletal: Negative for back pain. Skin: Negative for rash. Neurological: Negative for headaches, focal weakness or numbness.   10-point ROS otherwise negative.  ____________________________________________   PHYSICAL EXAM:  VITAL SIGNS: ED Triage Vitals  Enc Vitals Group     BP 04/14/15 0808 126/71 mmHg     Pulse Rate 04/14/15 0808 77     Resp 04/14/15 0808 22     Temp 04/14/15 0808 98.2 F (36.8 C)     Temp Source 04/14/15 0808 Oral     SpO2 04/14/15 0808 94 %     Weight 04/14/15 0808 181 lb (82.101 kg)     Height 04/14/15 0808 5' (1.524 m)     Head Cir --      Peak Flow --      Pain Score --      Pain Loc --      Pain Edu? --         Initial vital signs included an initial blood pressure that stayed below 126/71 with a heart rate of 77 respiratory rate of 22.  Her initial O2 sat is low at 94% she is afebrile.   Constitutional: Alert and oriented. Well appearing and in no distress. Eyes: Conjunctivae are normal. PERRL. Normal extraocular movements. ENT   Head: Normocephalic and atraumatic.   Nose: No congestion/rhinnorhea.   Mouth/Throat: Mucous membranes are moist.   Neck: No stridor. Hematological/Lymphatic/Immunilogical: No cervical lymphadenopathy. Cardiovascular: Normal rate, regular rhythm. Normal and symmetric distal pulses are present in all extremities. No murmurs, rubs, or gallops. Respiratory: Slightly tachypnea nor retractions. Breath sounds are equal bilaterally noted for positive bibasilar rales.. No  wheezes/rhonchi. Gastrointestinal: Soft and nontender. No distention. No abdominal bruits. There is no CVA tenderness. Genitourinary: It Musculoskeletal: Nontender with normal range of motion in all extremities. No joint effusions.  No lower extremity tenderness she has bilateral 2+ pitting edema.  Neurologic:  Normal speech and language. No gross focal neurologic deficits are appreciated. Speech is normal. No gait instability. She does not report any increasing weakness. Skin:  Skin is warm, dry and intact. No rash noted. Psychiatric: Mood and affect are normal. Speech and behavior are normal. Patient exhibits appropriate insight and judgment.  ____________________________________________    LABS (pertinent positives/negatives)  Labs Reviewed  CBC WITH DIFFERENTIAL/PLATELET - Abnormal; Notable for the following:    RBC 3.35 (*)    Hemoglobin 10.9 (*)    HCT 34.1 (*)    MCV 102.0 (*)    MCHC 31.8 (*)    RDW 19.0 (*)    Lymphs Abs 0.6 (*)    All other components within normal limits  COMPREHENSIVE METABOLIC PANEL - Abnormal; Notable for the following:    Glucose, Bld 133 (*)    ALT 9 (*)    All other components within normal limits  URINALYSIS COMPLETEWITH MICROSCOPIC ([facility])  - Abnormal; Notable for the following:    Color, Urine AMBER (*)    APPearance CLEAR (*)    Ketones, ur TRACE (*)    Specific Gravity, Urine 1.032 (*)    Protein, ur 100 (*)    Leukocytes, UA TRACE (*)    Bacteria, UA RARE (*)    Squamous Epithelial / LPF 6-30 (*)    All other components within normal limits  BRAIN NATRIURETIC PEPTIDE - Abnormal; Notable for the following:    B Natriuretic Peptide 707.0 (*)    All other components within normal limits  CBC - Abnormal; Notable for the following:    RBC 3.44 (*)    Hemoglobin 11.3 (*)    HCT 34.8 (*)    MCV 101.4 (*)    RDW 19.3 (*)    All other components within normal limits  GLUCOSE, CAPILLARY - Abnormal; Notable for the following:     Glucose-Capillary 162 (*)    All other components within normal limits  URINE CULTURE  TROPONIN I  CREATININE, SERUM  TROPONIN I  TROPONIN I  TROPONIN I   labs are notable for a anemia with a hemoglobin and hematocrit of 10.9 34.1. Glucose is slightly elevated at 133 liver enzymes are essentially normal UA is positive for proteinuria leukocyturia and rare bacteria. With 6-30 WBCs in the urine and 6-30 squamous epithelial cell she may have a UTI and urine culture will be sent.  The troponin is not elevated is normal at less than 0.0. ____________________________________________   EKG  ED ECG REPORT   Date: 04/14/2015  EKG Time: 8:25 AM  Rate: 83  Rhythm: atrial fibrillation, rate 83  Axis: Norma l Intervals:left anterior fascicular block anterior wall MI  age and known  ST&T Change specific ST-T wave changes __Low voltage __________________________________________    RADIOLOGY Chest x-ray done in the emergency department read by the radiologist finds positive for congestive heart failure with persistence of the elevation of the right diaphragm. Positive enlarged heart    ____________________________________________   PROCEDURES  Procedure(s) performed: None  Critical Care performed: No  ____________________________________________   INITIAL IMPRESSION / ASSESSMENT AND PLAN / ED COURSE  Pertinent labs & imaging results that were available during my care of the patient were reviewed by me and considered in my medical decision making (see chart for details).  80 year old female presents with increasing shortness of breath at rest on on exertion increasing peripheral edema .  In the emergency department labs were obtained and reveal a anemia and possibly urinary tract infection. Her chest x-ray reveals that she is in mild congestive heart failure. Next  In the ER she received nebulized bronchodilator and IV Solu-Medrol. She also received Lasix 80 mg IV to assist with  diuresis. She continued to be tachypneic in the emergency department with respiratory rates that ranged from the mid 20s to the high 30s. Her O2 sat dropped to 88% on room air. As she is not on by mouth oxygen she will be admitted to the hospital for further diuresis and evaluation of congestive heart failure and O2 support.  Private doctor Dr. Bobbye Charleston has been informed of the need to admit her. ____________________________________________   FINAL CLINICAL IMPRESSION(S) / ED DIAGNOSES  Final diagnoses:  SOB (shortness of breath)  Congestive heart failure of unknown etiology  Peripheral edema  Anemia, unspecified anemia type   1.shortness of breath  2congestive heart failure 3.peripheral edema   Boris Lown, DO 04/14/15 1651

## 2015-04-14 NOTE — ED Notes (Signed)
Pt placed on 2L [place]

## 2015-04-14 NOTE — ED Notes (Signed)
MD at bedside. 

## 2015-04-14 NOTE — ED Notes (Signed)
Family at bedside. 

## 2015-04-14 NOTE — ED Notes (Signed)
RN informed of Oxy level reading 88.

## 2015-04-15 ENCOUNTER — Inpatient Hospital Stay: Payer: Medicare Other

## 2015-04-15 LAB — BASIC METABOLIC PANEL
Anion gap: 9 (ref 5–15)
BUN: 17 mg/dL (ref 6–20)
CO2: 29 mmol/L (ref 22–32)
Calcium: 8.7 mg/dL — ABNORMAL LOW (ref 8.9–10.3)
Chloride: 102 mmol/L (ref 101–111)
Creatinine, Ser: 0.71 mg/dL (ref 0.44–1.00)
GFR calc Af Amer: >60 mL/min (ref >60)
GFR calc non Af Amer: >60 mL/min (ref >60)
Glucose, Bld: 180 mg/dL — ABNORMAL HIGH (ref 65–99)
POTASSIUM: 4.4 mmol/L (ref 3.5–5.1)
SODIUM: 140 mmol/L (ref 135–145)

## 2015-04-15 LAB — CBC
HEMATOCRIT: 29.9 % — AB (ref 35.0–47.0)
Hemoglobin: 10 g/dL — ABNORMAL LOW (ref 12.0–16.0)
MCH: 33.6 pg (ref 26.0–34.0)
MCHC: 33.6 g/dL (ref 32.0–36.0)
MCV: 100 fL (ref 80.0–100.0)
Platelets: 231 10*3/uL (ref 150–440)
RBC: 2.99 MIL/uL — AB (ref 3.80–5.20)
RDW: 18.9 % — ABNORMAL HIGH (ref 11.5–14.5)
WBC: 5.9 10*3/uL (ref 3.6–11.0)

## 2015-04-15 LAB — GLUCOSE, CAPILLARY
Glucose-Capillary: 105 mg/dL — ABNORMAL HIGH (ref 70–99)
Glucose-Capillary: 133 mg/dL — ABNORMAL HIGH (ref 70–99)
Glucose-Capillary: 109 mg/dL — ABNORMAL HIGH (ref 70–99)
Glucose-Capillary: 130 mg/dL — ABNORMAL HIGH (ref 70–99)

## 2015-04-15 MED ORDER — INSULIN ASPART 100 UNIT/ML ~~LOC~~ SOLN
0.0000 [IU] | Freq: Three times a day (TID) | SUBCUTANEOUS | Status: DC
Start: 2015-04-15 — End: 2015-04-16
  Administered 2015-04-15: 1 [IU] via SUBCUTANEOUS
  Filled 2015-04-15: qty 1

## 2015-04-15 MED ORDER — INSULIN ASPART 100 UNIT/ML ~~LOC~~ SOLN: 0.0000 [IU] | Freq: Every day | SUBCUTANEOUS | Status: DC

## 2015-04-15 NOTE — Consult Note (Signed)
[facility] Consultation Note  Patient ID: Julia Crawford, MRN: [id number], DOB/AGE: 04-02-1935 80 y.o. Admit date: 04/14/2015   Date of Consult: 04/15/2015 Primary Physician: Margarita Rana, MD Primary Cardiologist: Dr. Clayborn Bigness  Chief Complaint:  Chief Complaint  Patient presents with  . Respiratory Distress   Reason for Consult:  HPI: 80 y.o. female with h/o coronary atherosclerosis. Obstructive sleep apnea essential hypertension chronic atrial fibrillation on valvular chronic obstructive pulmonary disease diabetes who is had significant progression of shortness of breath and weakness and fatigue with and without physical activity over the 3-4 days increasing in frequency under intensity with shortness of breath and hypoxia. The patient was seen in the emergency room for which the patient had hypoxia to 88% with an x-ray consistent with acute on chronic ingestion of heart failure. The patient has been on CPAP machine and has claimed that she is diligent with this but has had progression of this shortness of breath despite treatment of her sleep apnea. Essential hypertension has been treated with calcium channel blocker reasonable at this time. In addition above the patient does have chronic nonvalvular atrial fibrillation on calcium channel blocker for which her heart rate is relatively controlled. She has not had any episodes of chest discomfort weakness or fatigue consistent with true anginal equivalent at this time. With her continued risk factors of diabetes she has been on appropriate metformin for which she has had no evidence of worsening issues. With her admission to the hospital she did have an EKG showing atrial fibrillation with controlled ventricular rate and nonspecific ST changes. There was no evidence of myocardial infarction with an elevated troponin of 0.03 most consistent with demand ischemia. She has had some relief of her hypoxia with oxygenation and supplemental  oxygen as well as with the use of Lasix intravenously which has helped a great deal. Now she is feeling somewhat better with no further evidence of critical cardiovascular disease concerns  Past Medical History  Diagnosis Date  . Hypertension   . Coronary artery disease   . COPD (chronic obstructive pulmonary disease)   . Arthritis   . Diabetes mellitus without complication   . Myasthenia gravis   . Hypothyroid   . Irregular heart beat   . CHF (congestive heart failure) 04/14/2015  . Atrial fibrillation 04/14/2015      Most Recent Cardiac Studies:    Surgical History:  Past Surgical History  Procedure Laterality Date  . Tonsillectomy       Home Meds: Prior to Admission medications   Medication Sig Start Date End Date Taking? Authorizing Provider  aspirin EC 81 MG tablet Take 81 mg by mouth daily.   Yes Historical Provider, MD  azaTHIOprine (IMURAN) 50 MG tablet Take 125 mg by mouth daily.   Yes Historical Provider, MD  Cyanocobalamin 1000 MCG TBCR Take 1 tablet by mouth daily.   Yes Historical Provider, MD  diltiazem (DILACOR XR) 240 MG 24 hr capsule Take 240 mg by mouth daily.   Yes Historical Provider, MD  Fluticasone-Salmeterol (ADVAIR) 250-50 MCG/DOSE AEPB Inhale 1 puff into the lungs 2 (two) times daily.   Yes Historical Provider, MD  furosemide (LASIX) 40 MG tablet Take 40 mg by mouth daily.   Yes Historical Provider, MD  levothyroxine (SYNTHROID, LEVOTHROID) 100 MCG tablet Take 100 mcg by mouth daily before breakfast.   Yes Historical Provider, MD  metFORMIN (GLUCOPHAGE) 500 MG tablet Take 500 mg by mouth 3 (three) times daily.   Yes Historical Provider,  MD  metoCLOPramide (REGLAN) 10 MG tablet Take 10 mg by mouth 2 (two) times daily.   Yes Historical Provider, MD  omeprazole (PRILOSEC OTC) 20 MG tablet Take 20 mg by mouth daily.   Yes Historical Provider, MD  pyridostigmine (MESTINON) 60 MG tablet Take 60 mg by mouth every 6 (six) hours.   Yes Historical Provider, MD   simvastatin (ZOCOR) 10 MG tablet Take 10 mg by mouth daily.   Yes Historical Provider, MD    Inpatient Medications:  . aspirin EC  81 mg Oral Daily  . azaTHIOprine  125 mg Oral Daily  . diltiazem  240 mg Oral Daily  . docusate sodium  100 mg Oral BID  . furosemide  20 mg Intravenous Q12H  . heparin  5,000 Units Subcutaneous 3 times per day  . insulin aspart  0-9 Units Subcutaneous TID WC  . levothyroxine  100 mcg Oral QAC breakfast  . lisinopril  5 mg Oral Daily  . metFORMIN  500 mg Oral TID WC  . metoCLOPramide  10 mg Oral BID  . mometasone-formoterol  2 puff Inhalation BID  . pantoprazole  40 mg Oral Daily  . pyridostigmine  60 mg Oral Q6H  . simvastatin  10 mg Oral q1800  . vitamin B-12  1,000 mcg Oral Daily      Allergies:  Allergies  Allergen Reactions  . Codeine Itching and Swelling    History   Social History  . Marital Status: Single    Spouse Name: N/A  . Number of Children: N/A  . Years of Education: N/A   Occupational History  . Not on file.   Social History Main Topics  . Smoking status: Never Smoker   . Smokeless tobacco: Not on file  . Alcohol Use: No  . Drug Use: No  . Sexual Activity: Not Currently   Other Topics Concern  . Not on file   Social History Narrative  . No narrative on file     Family History  Problem Relation Age of Onset  . CAD Mother   . Diabetes Father   . CAD Brother   . Diabetes Brother      Review of Systems: Positive for shortness of breath General: negative for chills, fever, night sweats or weight changes.  Cardiovascular: negative for chest pain, edema, orthopnea, palpitations, paroxysmal nocturnal dyspnea,  Dermatological: negative for rash Respiratory: negative for cough or wheezing Urologic: negative for hematuria Abdominal: negative for nausea, vomiting, diarrhea, bright red blood per rectum, melena, or hematemesis Neurologic: negative for visual changes, syncope, or dizziness All other systems reviewed  and are otherwise negative except as noted above.  Labs:  Recent Labs  04/14/15 0840 04/14/15 1441 04/14/15 1818 04/14/15 2208  TROPONINI <0.03 0.03 0.03 0.03   Lab Results  Component Value Date   WBC 5.9 04/15/2015   HGB 10.0* 04/15/2015   HCT 29.9* 04/15/2015   MCV 100.0 04/15/2015   PLT 231 04/15/2015    Recent Labs Lab 04/14/15 0840  04/15/15 0424  NA 139  --  140  K 4.3  --  4.4  CL 104  --  102  CO2 27  --  29  BUN 12  --  17  CREATININE 0.62  < > 0.71  CALCIUM 9.0  --  8.7*  PROT 7.3  --   --   BILITOT 1.0  --   --   ALKPHOS 50  --   --   ALT 9*  --   --  AST 19  --   --   GLUCOSE 133*  --  180*  < > = values in this interval not displayed. No results found for: CHOL, HDL, LDLCALC, TRIG No results found for: DDIMER  Radiology/Studies:  X-ray Chest Pa And Lateral  04/15/2015   CLINICAL DATA:  Two-month history of shortness of breath with exertion. Progressed 1 day prior.  EXAM: CHEST  2 VIEW  COMPARISON:  Apr 14, 2015  FINDINGS: There is again noted eventration of the right hemidiaphragm anteriorly. There is mild interstitial edema with cardiomegaly and pulmonary venous hypertension. There is no appreciable airspace consolidation. No adenopathy. There is degenerative change in the lower thoracic spine with mild anterior wedging of a lower thoracic vertebral body.  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 There is underlying congestive heart failure. No airspace consolidation.   Electronically Signed   By: Lowella Grip III M.D.   On: 04/15/2015 07:14   Dg Chest 2 View  04/14/2015   CLINICAL DATA:  Progressive shortness of breath  EXAM: CHEST  2 VIEW  COMPARISON:  February 21, 2014  FINDINGS: There is persistent elevation the right hemidiaphragm. There is mild generalized interstitial edema. There is cardiomegaly with pulmonary venous hypertension. No airspace consolidation. No appreciable  adenopathy. There is degenerative change in the thoracic spine.  IMPRESSION: Findings indicative of a degree of congestive heart failure.   Electronically Signed   By: Lowella Grip III M.D.   On: 04/14/2015 09:14    EKG: Atrial fibrillation with controlled ventricular rate and nonspecific ST and T wave changes  Weights: Filed Weights   04/14/15 0808 04/14/15 1739 04/15/15 0557  Weight: 181 lb (82.101 kg) 196 lb 14.4 oz (89.313 kg) 193 lb 3.2 oz (87.635 kg)     Physical Exam: Blood pressure 113/50, pulse 61, temperature 98 F (36.7 C), temperature source Oral, resp. rate 18, height 5' (1.524 m), weight 193 lb 3.2 oz (87.635 kg), SpO2 98 %. Body mass index is 37.73 kg/(m^2). General: Well developed, well nourished, in no acute distress. Head: Normocephalic, atraumatic, sclera non-icteric, no xanthomas, nares are without discharge.  Neck: Negative for carotid bruits. JVD not elevated. Lungs: Clear bilaterally to auscultation without wheezes, rales, or rhonchi. Breathing is unlabored. Heart: RRR with S1 S2. No murmurs, rubs, or gallops appreciated. Abdomen: Soft, non-tender, non-distended with normoactive bowel sounds. No hepatomegaly. No rebound/guarding. No obvious abdominal masses. Msk:  Strength and tone appear normal for age. Extremities: No clubbing or cyanosis. No edema.  Distal pedal pulses are 2+ and equal bilaterally. Neuro: Alert and oriented X 3. No facial asymmetry. No focal deficit. Moves all extremities spontaneously. Psych:  Responds to questions appropriately with a normal affect.    Assessment and Plan:  1. Coronary artery disease with elevated troponin No further intervention of coronary artery disease with minimal elevation of troponin most consistent with demand ischemia and no evidence of acute coronary syndrome and/or myocardial infarction 2. Acute on chronic systolic dysfunction congestive heart failure Continue intravenous Lasix for 24 more hours with change  over to oral Lasix watching closely for chronic kidney disease 3. Sleep apnea Continue CPAP machine for diligent use and reduction of hypoxia in the middle of night which could exacerbate her systolic dysfunction congestive heart failure 4. Chronic nonvalvular atrial fibrillation No change in calcium channel blocker for heart rate control of chronic atrial fibrillation 5. Essential hypertension Continue current medical regimen for essential  hypertension stable at this time 6. Decrease exercise tolerance Continue physical activity as she wishes with increased ambulation at this time to improve ability for discharge to home with no additional Signed, Corey Skains M.D. [facility] 04/15/2015, 7:54 AM

## 2015-04-15 NOTE — Progress Notes (Signed)
Have sent a heads up referral to [facility] for nursing to follow up CHF.  Patient intentionally not taking her oral lasix as prescribed due to having to urinate all the time.  She lives with her daughter and is independent in all adls.  No home 02.  Has been followed by [facility] in the past and if attending feels home health is indicated would like [facility]

## 2015-04-15 NOTE — Progress Notes (Signed)
RD Assessment- Diagnosis  Admitted with: CHF PMHx: HTN, DM, CHF  Current Diet: Heart Healthy/ Carb modified Typical Food/ Fluid Intake: 90-100% of meals recorded per I/O Meal/ Snack Patterns: Unable to assess, no decreased appetite per MST  Supplements: None  Food Allergies: NKFA Food Preferences: Reviewed  Ht: 60" Current weight: 193# BMI: 35.4 Weight Changes: Reviewed past medical records; wt x 1 year ago 201#; Current weight represents an 8# weight loss x 1 year- not significant  UOP: Reviewed Digestive: Reviewd Gastrointestinal: Reviewed Skin: Reviewed Physical Findings: n/a  Labs: Electrolyte and Renal Profile:    Recent Labs Lab 04/14/15 0840 04/14/15 1518 04/15/15 0424  BUN 12  --  17  CREATININE 0.62 0.74 0.71  NA 139  --  140  K 4.3  --  4.4   Protein Profile:  Recent Labs Lab 04/14/15 0840  ALBUMIN 3.9    Meds: Colace, Lasix, Novolog, Zocor, REglan  PES Statement: No nutrition concerns at this time.  Diagnosis:  Intervention: Meals and Snacks: Cater to patient preferences   Monitoring/ Evaluation: Energy Intake: goal for patient to meet >90% of estimated needs.   LOW Care Level  Roda Shutters, RDN Pager: [phone] Office: [phone]

## 2015-04-15 NOTE — Care Management Note (Signed)
Case Management Note  Patient Details  Name: Julia Crawford MRN: [id number] Date of Birth: June 06, 1935  Subjective/Objective:                    Action/Plan:   Expected Discharge Date:                  Expected Discharge Plan:     In-House Referral:     Discharge planning Services     Post Acute Care Choice:    Choice offered to:     DME Arranged:    DME Agency:     HH Arranged:    [place]:     Status of Service:     Medicare Important Message Given:   yes Date Medicare IM Given:   04/15/15 Medicare IM give by:   Joni Reining Date Additional Medicare IM Given:    Additional Medicare Important Message give by:     If discussed at [place] of Stay Meetings, dates discussed:    Additional Comments:  Katrina Stack, RN 04/15/2015, 8:53 AM

## 2015-04-15 NOTE — Progress Notes (Signed)
[facility] [place] NAME: Viriginia Crawford    MR#:  [id number]  DATE OF BIRTH:  1935/10/25  SUBJECTIVE:  Admitted for sob. Now feels much improved  REVIEW OF SYSTEMS:   Review of Systems  Constitutional: Positive for malaise/fatigue and diaphoresis. Negative for fever and chills.  HENT: Negative for congestion, hearing loss and tinnitus.   Eyes: Negative for blurred vision, double vision and redness.  Respiratory: Positive for cough, sputum production and shortness of breath.   Cardiovascular: Negative for chest pain, leg swelling and PND.  Gastrointestinal: Negative for heartburn, nausea, vomiting and abdominal pain.  Musculoskeletal: Positive for back pain and joint pain. Negative for myalgias.  Neurological: Negative for dizziness, tingling, tremors, sensory change, focal weakness and headaches.  Psychiatric/Behavioral: Negative for depression. The patient is not nervous/anxious.   All other systems reviewed and are negative.  Nutrition: oral Tolerating PT:yes Tolerating diet: yes  DRUG ALLERGIES:   Allergies  Allergen Reactions  . Codeine Itching and Swelling    VITALS:  Blood pressure 116/59, pulse 69, temperature 98.1 F (36.7 C), temperature source Oral, resp. rate 20, height 5' (1.524 m), weight 87.635 kg (193 lb 3.2 oz), SpO2 96 %.  PHYSICAL EXAMINATION:  GENERAL:  80 y.o.-year-old patient lying in the bed with no acute distress.  EYES: Pupils equal, round, reactive to light and accommodation. No scleral icterus. Extraocular muscles intact.  HEENT: Head atraumatic, normocephalic. Oropharynx and nasopharynx clear.  NECK:  Supple, no jugular venous distention. No thyroid enlargement, no tenderness.  LUNGS: Normal breath sounds bilaterally, no wheezing, rales,rhonchi or crepitation. No use of accessory muscles of respiration.  CARDIOVASCULAR: S1, S2 normal. No murmurs, rubs, or gallops.  ABDOMEN: Soft, nontender,  nondistended. Bowel sounds present. No organomegaly or mass.  EXTREMITIES: No pedal edema, cyanosis, or clubbing.  NEUROLOGIC: Cranial nerves II through XII are intact. Muscle strength 5/5 in all extremities. Sensation intact. Gait not checked.  PSYCHIATRIC: The patient is alert and oriented x 3.  SKIN: No obvious rash, lesion, or ulcer.    LABORATORY PANEL:   CBC  Recent Labs Lab 04/14/15 1518 04/15/15 0424  WBC 4.8 5.9  HGB 11.3* 10.0*  HCT 34.8* 29.9*  PLT 272 231   ------------------------------------------------------------------------------------------------------------------  Chemistries   Recent Labs Lab 04/14/15 0840 04/14/15 1518 04/15/15 0424  NA 139  --  140  K 4.3  --  4.4  CL 104  --  102  CO2 27  --  29  GLUCOSE 133*  --  180*  BUN 12  --  17  CREATININE 0.62 0.74 0.71  CALCIUM 9.0  --  8.7*  AST 19  --   --   ALT 9*  --   --   ALKPHOS 50  --   --   BILITOT 1.0  --   --    ------------------------------------------------------------------------------------------------------------------  Cardiac Enzymes  Recent Labs Lab 04/14/15 1818 04/14/15 2208  TROPONINI 0.03 0.03   ------------------------------------------------------------------------------------------------------------------  RADIOLOGY:  X-ray Chest Pa And Lateral  04/15/2015   CLINICAL DATA:  Two-month history of shortness of breath with exertion. Progressed 1 day prior.  EXAM: CHEST  2 VIEW  COMPARISON:  Apr 14, 2015  FINDINGS: There is again noted eventration of the right hemidiaphragm anteriorly. There is mild interstitial edema with cardiomegaly and pulmonary venous hypertension. There is no appreciable airspace consolidation. No adenopathy. There is degenerative change in the lower thoracic spine with mild anterior wedging of a lower thoracic  vertebral body.  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 There is  underlying congestive heart failure. No airspace consolidation.   Electronically Signed   By: Lowella Grip III M.D.   On: 04/15/2015 07:14   Dg Chest 2 View  04/14/2015   CLINICAL DATA:  Progressive shortness of breath  EXAM: CHEST  2 VIEW  COMPARISON:  February 21, 2014  FINDINGS: There is persistent elevation the right hemidiaphragm. There is mild generalized interstitial edema. There is cardiomegaly with pulmonary venous hypertension. No airspace consolidation. No appreciable adenopathy. There is degenerative change in the thoracic spine.  IMPRESSION: Findings indicative of a degree of congestive heart failure.   Electronically Signed   By: Lowella Grip III M.D.   On: 04/14/2015 09:14     ASSESSMENT AND PLAN:   * Acute respiratory failure CHF exacerbation  Not known EF, Will get Echo  IV lasix and monitor I/O  * A fib  On cardizem CD- rate controlled  Not on anticoagulants due to interactions with her Myasthenia meds.  Stable issue -cardioloy consult noted  * Htn  Will be on IV lasix, cardizem. -lisnopril  * DM  Cont Metformin  SSI    * Myasthenia Gravis Om Azathioprine     All the records are reviewed and case discussed with Care Management/Social Workerr. Management plans discussed with the patient, family and they are in agreement.  CODE STATUS: full  TOTAL TIME TAKING CARE OF THIS PATIENT: 35 mins.   POSSIBLE D/C IN Cecilie Lowers M.D on 04/15/2015 at 2:52 PM  Between 7am to 6pm - Pager - [phone]  After 6pm go to [email or web address] - password [email or web address] [facility]  Office  [phone]  CC: Primary care physician; Margarita Rana, MD

## 2015-04-15 NOTE — Progress Notes (Signed)
No bedtime insulin coverage ordered, Patel notified, modified order to include bedtime SSI

## 2015-04-16 LAB — URINE CULTURE

## 2015-04-16 LAB — GLUCOSE, CAPILLARY
GLUCOSE-CAPILLARY: 96 mg/dL (ref 70–99)
Glucose-Capillary: 102 mg/dL — ABNORMAL HIGH (ref 70–99)

## 2015-04-16 MED ORDER — LISINOPRIL 5 MG PO TABS: 5.0000 mg | ORAL_TABLET | Freq: Every day | ORAL | Status: DC

## 2015-04-16 NOTE — Discharge Instructions (Signed)

## 2015-04-16 NOTE — Progress Notes (Signed)
Patient is discharged= in a stable condition , no acute distress noted at time of discharge , summary  given, pt's  To cal PMD for 1 wk f/u care as offices are closed at this time  Verbalized understanding, left with daughter .

## 2015-04-16 NOTE — Care Management Note (Signed)
Case Management Note  Patient Details  Name: Julia Crawford MRN: [id number] Date of Birth: 04-17-1935  Subjective/Objective:                    Action/Plan:  Referral for home health RN faxed and called to [facility].  Expected Discharge Date:  04/16/15               Expected Discharge Plan:  [place]  In-House Referral:     Discharge planning Services  CM Consult  Post Acute Care Choice:    Choice offered to:  Patient  DME Arranged:    DME Agency:  [place]:  RN [facility] Agency:  [place]  Status of Service:     Medicare Important Message Given:  Yes Date Medicare IM Given:  04/15/15 Medicare IM give by:  Joni Reining Date Additional Medicare IM Given:    Additional Medicare Important Message give by:     If discussed at [place] of Stay Meetings, dates discussed:    Additional Comments:  Jacori Mulrooney A, RN 04/16/2015, 11:23 AM

## 2015-04-16 NOTE — Progress Notes (Signed)
[facility] Encounter Note  [name] / Admit Date: 04/14/2015 / Date of Encounter: 04/16/2015, 8:49 AM   Subjective: Patient is mildly short of breath but improved  Review of Systems: Positive for: Shortness of breath weakness and fatigue Negative for: Others previously listed  Objective: Telemetry: Atrial fibrillation with bundle branch block Physical Exam: Blood pressure 111/59, pulse 136, temperature 97.7 F (36.5 C), temperature source Oral, resp. rate 18, height 5' (1.524 m), weight 195 lb 8 oz (88.678 kg), SpO2 98 %. Body mass index is 38.18 kg/(m^2). General: Well developed, well nourished, in no acute distress. Head: Normocephalic, atraumatic, sclera non-icteric, no xanthomas, nares are without discharge. Neck: No apparent masses Lungs: Normal respirations with no wheezes, no rhonchi, + Rales and/or crackles, Heart: irregular rate and rhythm, normal S1-S2, no murmur, no rub, no gallop, PMI is normal size and placement, carotid upstroke normal without bruit, jugular venous pressure normal Abdomen: Soft, non-tender, non-distended with normoactive bowel sounds. No apparent hepatosplenomegaly. Abdominal aorta is normal size without bruit Extremities: No edema, no clubbing, no cyanosis, no ulcers,  Peripheral: 2+ radial, 2+ femoral, 2+ dorsal pedal pulses Neuro: Alert and oriented X 3. Moves all extremities spontaneously. Psych:  Responds to questions appropriately with a normal affect.   Intake/Output Summary (Last 24 hours) at 04/16/15 0849 Last data filed at 04/16/15 0444  Gross per 24 hour  Intake    480 ml  Output    500 ml  Net    -20 ml    Inpatient Medications:  . aspirin EC  81 mg Oral Daily  . azaTHIOprine  125 mg Oral Daily  . diltiazem  240 mg Oral Daily  . docusate sodium  100 mg Oral BID  . furosemide  20 mg Intravenous Q12H  . heparin  5,000 Units Subcutaneous 3 times per day  . insulin aspart  0-9 Units Subcutaneous TID  AC & HS  . levothyroxine  100 mcg Oral QAC breakfast  . lisinopril  5 mg Oral Daily  . metFORMIN  500 mg Oral TID WC  . metoCLOPramide  10 mg Oral BID  . mometasone-formoterol  2 puff Inhalation BID  . pantoprazole  40 mg Oral Daily  . pyridostigmine  60 mg Oral Q6H  . simvastatin  10 mg Oral q1800  . vitamin B-12  1,000 mcg Oral Daily   Infusions:    Labs:  Recent Labs  04/14/15 0840 04/14/15 1518 04/15/15 0424  NA 139  --  140  K 4.3  --  4.4  CL 104  --  102  CO2 27  --  29  GLUCOSE 133*  --  180*  BUN 12  --  17  CREATININE 0.62 0.74 0.71  CALCIUM 9.0  --  8.7*    Recent Labs  04/14/15 0840  AST 19  ALT 9*  ALKPHOS 50  BILITOT 1.0  PROT 7.3  ALBUMIN 3.9    Recent Labs  04/14/15 0840 04/14/15 1518 04/15/15 0424  WBC 4.2 4.8 5.9  NEUTROABS 2.9  --   --   HGB 10.9* 11.3* 10.0*  HCT 34.1* 34.8* 29.9*  MCV 102.0* 101.4* 100.0  PLT 286 272 231    Recent Labs  04/14/15 0840 04/14/15 1441 04/14/15 1818 04/14/15 2208  TROPONINI <0.03 0.03 0.03 0.03   Invalid input(s): POCBNP No results for input(s): HGBA1C in the last 72 hours.   Weights: Filed Weights   04/14/15 1739 04/15/15 0557 04/16/15 0442  Weight: 196  lb 14.4 oz (89.313 kg) 193 lb 3.2 oz (87.635 kg) 195 lb 8 oz (88.678 kg)     Radiology/Studies:  X-ray Chest Pa And Lateral  04/15/2015   CLINICAL DATA:  Two-month history of shortness of breath with exertion. Progressed 1 day prior.  EXAM: CHEST  2 VIEW  COMPARISON:  Apr 14, 2015  FINDINGS: There is again noted eventration of the right hemidiaphragm anteriorly. There is mild interstitial edema with cardiomegaly and pulmonary venous hypertension. There is no appreciable airspace consolidation. No adenopathy. There is degenerative change in the lower thoracic spine with mild anterior wedging of a lower thoracic vertebral body.  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  There is underlying congestive heart failure. No airspace consolidation.   Electronically Signed   By: Lowella Grip III M.D.   On: 04/15/2015 07:14   Dg Chest 2 View  04/14/2015   CLINICAL DATA:  Progressive shortness of breath  EXAM: CHEST  2 VIEW  COMPARISON:  February 21, 2014  FINDINGS: There is persistent elevation the right hemidiaphragm. There is mild generalized interstitial edema. There is cardiomegaly with pulmonary venous hypertension. No airspace consolidation. No appreciable adenopathy. There is degenerative change in the thoracic spine.  IMPRESSION: Findings indicative of a degree of congestive heart failure.   Electronically Signed   By: Lowella Grip III M.D.   On: 04/14/2015 09:14     Assessment and Recommendation  80 y.o. female with acute on chronic systolic dysfunction congestive heart failure with minimal elevation of troponin consistent with demand ischemia without evidence of myocardial infarction with sleep apnea essential hypertension and known coronary artery disease diabetes chronic nonvalvular atrial fibrillation slightly improved with intravenous diuretics -Continue Lasix and changed to oral Lasix at this time watching for further concerns of chronic kidney disease 2. No further intervention of minimal elevation of troponin consistent with demand ischemia 3. No change in medication management for current hypertension control 4. Heart rate control with calcium channel blocker without change today 5. Continue ACE inhibitor and Alcian channel blocker for systolic dysfunction congestive heart failure 6. Further consideration of anticoagulation for further risk reduction in stroke with atrial fibrillation unless contraindicated at this time due to bleeding although currently there is no evidence of bleeding complications or significant anemia  Signed, Serafina Royals M.D. FACC

## 2015-04-16 NOTE — Progress Notes (Signed)
Faxed and called to [facility] a request for home health RN.

## 2015-04-16 NOTE — Care Management Note (Signed)
Case Management Note  Patient Details  Name: VIRGINIA CURL MRN: [id number] Date of Birth: 08/01/1935  Subjective/Objective:                    Action/Plan:   Expected Discharge Date:  04/16/15               Expected Discharge Plan:  [place]  In-House Referral:     Discharge planning Services  CM Consult  Post Acute Care Choice:    Choice offered to:  Patient  DME Arranged:    DME Agency:  [place]:  RN [facility] Agency:  [place]  Status of Service:     Medicare Important Message Given:  Yes Date Medicare IM Given:  04/15/15 Medicare IM give by:  Joni Reining Date Additional Medicare IM Given:    Additional Medicare Important Message give by:     If discussed at [place] of Stay Meetings, dates discussed:    Additional Comments:  Jenee Spaugh A, RN 04/16/2015, 11:22 AM

## 2015-04-16 NOTE — Progress Notes (Signed)
Request for home health RN faxed and called to [facility].

## 2015-04-16 NOTE — Discharge Summary (Signed)
[place] [facility] [place] NAME: Julia Crawford    MR#:  [id number]  DATE OF BIRTH:  [birth year]  DATE OF ADMISSION:  04/14/2015 ADMITTING PHYSICIAN: Vaughan Basta, MD  DATE OF DISCHARGE: 04/16/2015 12:19 PM  PRIMARY CARE PHYSICIAN: Margarita Rana, MD    ADMISSION DIAGNOSIS:  CHF (congestive heart failure) [I50.9] Peripheral edema [R60.9] SOB (shortness of breath) [R06.02] Congestive heart failure of unknown etiology [I50.9] Anemia, unspecified anemia type [D64.9]  DISCHARGE DIAGNOSIS:  Principal Problem:   CHF (congestive heart failure) Active Problems:   Atrial fibrillation   SECONDARY DIAGNOSIS:   Past Medical History  Diagnosis Date  . Hypertension   . Coronary artery disease   . COPD (chronic obstructive pulmonary disease)   . Arthritis   . Diabetes mellitus without complication   . Myasthenia gravis   . Hypothyroid   . Irregular heart beat   . CHF (congestive heart failure) 04/14/2015  . Atrial fibrillation 04/14/2015    HOSPITAL COURSE:   * Acute respiratory failure CHF exacerbation - this was likely the cause of patient's shortness of breath and admission patient was diuresed with IV Lasix and is clinically improved. Patient was likely not compliant with her Lasix prior to coming in and therefore was strongly advised to be compliant with her meds upon discharge.   Patient was assessed for home oxygen did not qualify  * Congestive heart failure -this was likely the cause of patient's shortness of breath and admission.Patient was diuresed with IV Lasix and is clinically improved. Patient was seen by cardiology who agreed with this management. Patient did have an echocardiogram done which showed severely reduced ejection fraction of 25-30%. Patient will continue oral Cardizem and also has been started on a low-dose ACE inhibitor. Patient would likely benefit from being on oral carvedilol but this is to be further discussed  with her cardiologist Dr. Clayborn Bigness as an outpatient.   * A fib - patient remained rate controlled on oral Cardizem and she will continue that. Patient is not on long-term coagulation and this is to be further discussed with her cardiologist. For now patient will continue aspirin  * Htn - patient remained hemodynamic stable she will continue her Cardizem and her low-dose lisinopril.  * DM - there was no evidence of hypoglycemic episodes patient was maintained on some sliding scale insulin but she will resume her metformin upon discharge  * Myasthenia Gravis - patient was maintained on azathioprine and she will resume that  * Hypothyroidism -patient was maintained on her Synthroid she will resume that  * GERD -patient was maintained on her omeprazole she will resume that  DISCHARGE CONDITIONS:   Stable  CONSULTS OBTAINED:  Treatment Team:  Corey Skains, MD  DRUG ALLERGIES:   Allergies  Allergen Reactions  . Codeine Itching and Swelling    DISCHARGE MEDICATIONS:   Discharge Medication List as of 04/16/2015 11:49 AM    START taking these medications   Details  lisinopril (PRINIVIL,ZESTRIL) 5 MG tablet Take 1 tablet (5 mg total) by mouth daily., Starting 04/16/2015, Until Discontinued, Print      CONTINUE these medications which have NOT CHANGED   Details  aspirin EC 81 MG tablet Take 81 mg by mouth daily., Until Discontinued, Historical Med    azaTHIOprine (IMURAN) 50 MG tablet Take 125 mg by mouth daily., Until Discontinued, Historical Med    Cyanocobalamin 1000 MCG TBCR Take 1 tablet by mouth daily., Until Discontinued, Historical Med  diltiazem (DILACOR XR) 240 MG 24 hr capsule Take 240 mg by mouth daily., Until Discontinued, Historical Med    Fluticasone-Salmeterol (ADVAIR) 250-50 MCG/DOSE AEPB Inhale 1 puff into the lungs 2 (two) times daily., Until Discontinued, Historical Med    furosemide (LASIX) 40 MG tablet Take 40 mg by mouth daily., Until Discontinued,  Historical Med    levothyroxine (SYNTHROID, LEVOTHROID) 100 MCG tablet Take 100 mcg by mouth daily before breakfast., Until Discontinued, Historical Med    metFORMIN (GLUCOPHAGE) 500 MG tablet Take 500 mg by mouth 3 (three) times daily., Until Discontinued, Historical Med    metoCLOPramide (REGLAN) 10 MG tablet Take 10 mg by mouth 2 (two) times daily., Until Discontinued, Historical Med    omeprazole (PRILOSEC OTC) 20 MG tablet Take 20 mg by mouth daily., Until Discontinued, Historical Med    pyridostigmine (MESTINON) 60 MG tablet Take 60 mg by mouth every 6 (six) hours., Until Discontinued, Historical Med    simvastatin (ZOCOR) 10 MG tablet Take 10 mg by mouth daily., Until Discontinued, Historical Med         DISCHARGE INSTRUCTIONS:   DIET:  Cardiac diet  DISCHARGE CONDITION:  Stable  ACTIVITY:  Activity as tolerated  OXYGEN:  Home Oxygen: No.   Oxygen Delivery: room air  DISCHARGE LOCATION:  home   If you experience worsening of your admission symptoms, develop shortness of breath, life threatening emergency, suicidal or homicidal thoughts you must seek medical attention immediately by calling 911 or calling your MD immediately  if symptoms less severe.  You Must read complete instructions/literature along with all the possible adverse reactions/side effects for all the Medicines you take and that have been prescribed to you. Take any new Medicines after you have completely understood and accpet all the possible adverse reactions/side effects.   Please note  You were cared for by a hospitalist during your hospital stay. If you have any questions about your discharge medications or the care you received while you were in the hospital after you are discharged, you can call the unit and asked to speak with the hospitalist on call if the hospitalist that took care of you is not available. Once you are discharged, your primary care physician will handle any further medical  issues. Please note that NO REFILLS for any discharge medications will be authorized once you are discharged, as it is imperative that you return to your primary care physician (or establish a relationship with a primary care physician if you do not have one) for your aftercare needs so that they can reassess your need for medications and monitor your lab values.     Today   CHIEF COMPLAINT:   Chief Complaint  Patient presents with  . Respiratory Distress    HISTORY OF PRESENT ILLNESS:  Julia Crawford  is a 80 y.o. female with a known history of a. Fib, HTN, Diabetes, hx of myesthenia gravis, COPD, Hypothyroidism who presented to the hospital w/ shortness of breath and noted to be in CHF.    VITAL SIGNS:  Blood pressure 116/61, pulse 68, temperature 98.2 F (36.8 C), temperature source Oral, resp. rate 18, height 5' (1.524 m), weight 88.678 kg (195 lb 8 oz), SpO2 95 %.  I/O:   Intake/Output Summary (Last 24 hours) at 04/16/15 1616 Last data filed at 04/16/15 0854  Gross per 24 hour  Intake    600 ml  Output   1200 ml  Net   -600 ml    PHYSICAL EXAMINATION:  GENERAL:  80 y.o.-year-old patient lying in the bed with no acute distress.  EYES: Pupils equal, round, reactive to light and accommodation. No scleral icterus. Extraocular muscles intact.  HEENT: Head atraumatic, normocephalic. Oropharynx and nasopharynx clear.  NECK:  Supple, no jugular venous distention. No thyroid enlargement or tenderness.  LUNGS: Normal breath sounds bilaterally, no wheezing, rales,rhonchi or crepitation. No use of accessory muscles of respiration.  CARDIOVASCULAR: S1, S2 normal. + II/VI Sem at RSB. No rubs, clicks.  ABDOMEN: Soft, non-tender, non-distended. Bowel sounds present. No organomegaly or mass.  EXTREMITIES: No pedal edema, cyanosis, or clubbing.  NEUROLOGIC: Cranial nerves II through XII are intact. No focal motor or sensory defecits b/l.  PSYCHIATRIC: The patient is alert and oriented x 3.   SKIN: No obvious rash, lesion, or ulcer.   DATA REVIEW:   CBC  Recent Labs Lab 04/15/15 0424  WBC 5.9  HGB 10.0*  HCT 29.9*  PLT 231    Chemistries   Recent Labs Lab 04/14/15 0840  04/15/15 0424  NA 139  --  140  K 4.3  --  4.4  CL 104  --  102  CO2 27  --  29  GLUCOSE 133*  --  180*  BUN 12  --  17  CREATININE 0.62  < > 0.71  CALCIUM 9.0  --  8.7*  AST 19  --   --   ALT 9*  --   --   ALKPHOS 50  --   --   BILITOT 1.0  --   --   < > = values in this interval not displayed.  Cardiac Enzymes  Recent Labs Lab 04/14/15 2208  TROPONINI 0.03    Microbiology Results  Results for orders placed or performed during the hospital encounter of 04/14/15  Urine culture     Status: None (Preliminary result)   Collection Time: 04/14/15  9:23 AM  Result Value Ref Range Status   Specimen Description URINE, CLEAN CATCH  Final   Special Requests NONE  Final   Culture   Final    30,000 COLONIES/ml GRAM NEGATIVE RODS IDENTIFICATION TO FOLLOW SUSCEPTIBILITIES TO FOLLOW    Report Status PENDING  Incomplete    RADIOLOGY:  X-ray Chest Pa And Lateral  04/15/2015   CLINICAL DATA:  Two-month history of shortness of breath with exertion. Progressed 1 day prior.  EXAM: CHEST  2 VIEW  COMPARISON:  Apr 14, 2015  FINDINGS: There is again noted eventration of the right hemidiaphragm anteriorly. There is mild interstitial edema with cardiomegaly and pulmonary venous hypertension. There is no appreciable airspace consolidation. No adenopathy. There is degenerative change in the lower thoracic spine with mild anterior wedging of a lower thoracic vertebral body.  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 There is underlying congestive heart failure. No airspace consolidation.   Electronically Signed   By: Lowella Grip III M.D.   On: 04/15/2015 07:14    Management plans discussed with the patient, family and they  are in agreement.  CODE STATUS:   TOTAL TIME TAKING CARE OF THIS PATIENT: 40 minutes.    Henreitta Leber M.D on 04/16/2015 at 4:16 PM  Between 7am to 6pm - Pager - [phone]  After 6pm go to [email or web address] - password [email or web address] [place] [facility]  Office  [phone]  CC: Primary care physician; Margarita Rana, MD

## 2015-04-16 NOTE — Progress Notes (Signed)
Notified physician about CPAP at bedtime.

## 2015-04-18 DIAGNOSIS — I509 Heart failure, unspecified: Secondary | ICD-10-CM | POA: Diagnosis not present

## 2015-04-18 DIAGNOSIS — M1990 Unspecified osteoarthritis, unspecified site: Secondary | ICD-10-CM | POA: Diagnosis not present

## 2015-04-18 DIAGNOSIS — E039 Hypothyroidism, unspecified: Secondary | ICD-10-CM | POA: Diagnosis not present

## 2015-04-18 DIAGNOSIS — I4891 Unspecified atrial fibrillation: Secondary | ICD-10-CM | POA: Diagnosis not present

## 2015-04-18 DIAGNOSIS — I2510 Atherosclerotic heart disease of native coronary artery without angina pectoris: Secondary | ICD-10-CM | POA: Diagnosis not present

## 2015-04-18 DIAGNOSIS — E119 Type 2 diabetes mellitus without complications: Secondary | ICD-10-CM | POA: Diagnosis not present

## 2015-04-18 DIAGNOSIS — I10 Essential (primary) hypertension: Secondary | ICD-10-CM | POA: Diagnosis not present

## 2015-04-18 DIAGNOSIS — J441 Chronic obstructive pulmonary disease with (acute) exacerbation: Secondary | ICD-10-CM | POA: Diagnosis not present

## 2015-04-19 DIAGNOSIS — G4733 Obstructive sleep apnea (adult) (pediatric): Secondary | ICD-10-CM | POA: Diagnosis not present

## 2015-04-20 DIAGNOSIS — I2510 Atherosclerotic heart disease of native coronary artery without angina pectoris: Secondary | ICD-10-CM | POA: Diagnosis not present

## 2015-04-20 DIAGNOSIS — I480 Paroxysmal atrial fibrillation: Secondary | ICD-10-CM | POA: Diagnosis not present

## 2015-04-20 DIAGNOSIS — E119 Type 2 diabetes mellitus without complications: Secondary | ICD-10-CM | POA: Diagnosis not present

## 2015-04-20 DIAGNOSIS — E039 Hypothyroidism, unspecified: Secondary | ICD-10-CM | POA: Diagnosis not present

## 2015-04-20 DIAGNOSIS — I509 Heart failure, unspecified: Secondary | ICD-10-CM | POA: Diagnosis not present

## 2015-04-20 DIAGNOSIS — J441 Chronic obstructive pulmonary disease with (acute) exacerbation: Secondary | ICD-10-CM | POA: Diagnosis not present

## 2015-04-20 DIAGNOSIS — I10 Essential (primary) hypertension: Secondary | ICD-10-CM | POA: Diagnosis not present

## 2015-04-20 DIAGNOSIS — M1990 Unspecified osteoarthritis, unspecified site: Secondary | ICD-10-CM | POA: Diagnosis not present

## 2015-04-20 DIAGNOSIS — I4891 Unspecified atrial fibrillation: Secondary | ICD-10-CM | POA: Diagnosis not present

## 2015-04-25 DIAGNOSIS — D899 Disorder involving the immune mechanism, unspecified: Secondary | ICD-10-CM | POA: Diagnosis not present

## 2015-04-25 DIAGNOSIS — E119 Type 2 diabetes mellitus without complications: Secondary | ICD-10-CM | POA: Diagnosis not present

## 2015-04-25 DIAGNOSIS — G7000 Myasthenia gravis without (acute) exacerbation: Secondary | ICD-10-CM | POA: Diagnosis not present

## 2015-04-25 DIAGNOSIS — E538 Deficiency of other specified B group vitamins: Secondary | ICD-10-CM | POA: Diagnosis not present

## 2015-04-25 DIAGNOSIS — E039 Hypothyroidism, unspecified: Secondary | ICD-10-CM | POA: Diagnosis not present

## 2015-04-25 DIAGNOSIS — Z79899 Other long term (current) drug therapy: Secondary | ICD-10-CM | POA: Diagnosis not present

## 2015-04-25 LAB — BASIC METABOLIC PANEL
BUN: 12 mg/dL (ref 4–21)
Creatinine: 0.7 mg/dL (ref <=1.1)
Glucose: 130 mg/dL
Sodium: 143 mmol/L (ref 137–147)

## 2015-04-25 LAB — HEMOGLOBIN A1C: HEMOGLOBIN A1C: 6.4 % — AB (ref 4.0–6.0)

## 2015-04-26 DIAGNOSIS — I4891 Unspecified atrial fibrillation: Secondary | ICD-10-CM | POA: Diagnosis not present

## 2015-04-26 DIAGNOSIS — E039 Hypothyroidism, unspecified: Secondary | ICD-10-CM | POA: Diagnosis not present

## 2015-04-26 DIAGNOSIS — I2510 Atherosclerotic heart disease of native coronary artery without angina pectoris: Secondary | ICD-10-CM | POA: Diagnosis not present

## 2015-04-26 DIAGNOSIS — I509 Heart failure, unspecified: Secondary | ICD-10-CM | POA: Diagnosis not present

## 2015-04-26 DIAGNOSIS — I10 Essential (primary) hypertension: Secondary | ICD-10-CM | POA: Diagnosis not present

## 2015-04-26 DIAGNOSIS — J441 Chronic obstructive pulmonary disease with (acute) exacerbation: Secondary | ICD-10-CM | POA: Diagnosis not present

## 2015-04-26 DIAGNOSIS — M1990 Unspecified osteoarthritis, unspecified site: Secondary | ICD-10-CM | POA: Diagnosis not present

## 2015-04-26 DIAGNOSIS — E119 Type 2 diabetes mellitus without complications: Secondary | ICD-10-CM | POA: Diagnosis not present

## 2015-04-27 DIAGNOSIS — I509 Heart failure, unspecified: Secondary | ICD-10-CM | POA: Diagnosis not present

## 2015-04-27 DIAGNOSIS — J441 Chronic obstructive pulmonary disease with (acute) exacerbation: Secondary | ICD-10-CM | POA: Diagnosis not present

## 2015-04-27 DIAGNOSIS — M1990 Unspecified osteoarthritis, unspecified site: Secondary | ICD-10-CM | POA: Diagnosis not present

## 2015-04-27 DIAGNOSIS — I4891 Unspecified atrial fibrillation: Secondary | ICD-10-CM | POA: Diagnosis not present

## 2015-04-27 DIAGNOSIS — E039 Hypothyroidism, unspecified: Secondary | ICD-10-CM | POA: Diagnosis not present

## 2015-04-27 DIAGNOSIS — I2510 Atherosclerotic heart disease of native coronary artery without angina pectoris: Secondary | ICD-10-CM | POA: Diagnosis not present

## 2015-04-27 DIAGNOSIS — I10 Essential (primary) hypertension: Secondary | ICD-10-CM | POA: Diagnosis not present

## 2015-04-27 DIAGNOSIS — E119 Type 2 diabetes mellitus without complications: Secondary | ICD-10-CM | POA: Diagnosis not present

## 2015-04-29 DIAGNOSIS — E119 Type 2 diabetes mellitus without complications: Secondary | ICD-10-CM | POA: Diagnosis not present

## 2015-04-29 DIAGNOSIS — M1990 Unspecified osteoarthritis, unspecified site: Secondary | ICD-10-CM | POA: Diagnosis not present

## 2015-04-29 DIAGNOSIS — E039 Hypothyroidism, unspecified: Secondary | ICD-10-CM | POA: Diagnosis not present

## 2015-04-29 DIAGNOSIS — I10 Essential (primary) hypertension: Secondary | ICD-10-CM | POA: Diagnosis not present

## 2015-04-29 DIAGNOSIS — I2510 Atherosclerotic heart disease of native coronary artery without angina pectoris: Secondary | ICD-10-CM | POA: Diagnosis not present

## 2015-04-29 DIAGNOSIS — I509 Heart failure, unspecified: Secondary | ICD-10-CM | POA: Diagnosis not present

## 2015-04-29 DIAGNOSIS — J441 Chronic obstructive pulmonary disease with (acute) exacerbation: Secondary | ICD-10-CM | POA: Diagnosis not present

## 2015-04-29 DIAGNOSIS — I4891 Unspecified atrial fibrillation: Secondary | ICD-10-CM | POA: Diagnosis not present

## 2015-05-04 DIAGNOSIS — I10 Essential (primary) hypertension: Secondary | ICD-10-CM | POA: Diagnosis not present

## 2015-05-04 DIAGNOSIS — E119 Type 2 diabetes mellitus without complications: Secondary | ICD-10-CM | POA: Diagnosis not present

## 2015-05-04 DIAGNOSIS — E039 Hypothyroidism, unspecified: Secondary | ICD-10-CM | POA: Diagnosis not present

## 2015-05-04 DIAGNOSIS — I2510 Atherosclerotic heart disease of native coronary artery without angina pectoris: Secondary | ICD-10-CM | POA: Diagnosis not present

## 2015-05-04 DIAGNOSIS — J441 Chronic obstructive pulmonary disease with (acute) exacerbation: Secondary | ICD-10-CM | POA: Diagnosis not present

## 2015-05-04 DIAGNOSIS — I509 Heart failure, unspecified: Secondary | ICD-10-CM | POA: Diagnosis not present

## 2015-05-04 DIAGNOSIS — M1990 Unspecified osteoarthritis, unspecified site: Secondary | ICD-10-CM | POA: Diagnosis not present

## 2015-05-04 DIAGNOSIS — I4891 Unspecified atrial fibrillation: Secondary | ICD-10-CM | POA: Diagnosis not present

## 2015-05-13 DIAGNOSIS — IMO0002 Reserved for concepts with insufficient information to code with codable children: Secondary | ICD-10-CM | POA: Insufficient documentation

## 2015-05-13 DIAGNOSIS — E875 Hyperkalemia: Secondary | ICD-10-CM | POA: Insufficient documentation

## 2015-05-13 DIAGNOSIS — M179 Osteoarthritis of knee, unspecified: Secondary | ICD-10-CM | POA: Insufficient documentation

## 2015-05-13 DIAGNOSIS — R32 Unspecified urinary incontinence: Secondary | ICD-10-CM | POA: Insufficient documentation

## 2015-05-13 DIAGNOSIS — B370 Candidal stomatitis: Secondary | ICD-10-CM | POA: Insufficient documentation

## 2015-05-13 DIAGNOSIS — K648 Other hemorrhoids: Secondary | ICD-10-CM | POA: Insufficient documentation

## 2015-05-13 DIAGNOSIS — Z0000 Encounter for general adult medical examination without abnormal findings: Secondary | ICD-10-CM | POA: Insufficient documentation

## 2015-05-13 DIAGNOSIS — M1710 Unilateral primary osteoarthritis, unspecified knee: Secondary | ICD-10-CM | POA: Insufficient documentation

## 2015-05-13 DIAGNOSIS — I110 Hypertensive heart disease with heart failure: Secondary | ICD-10-CM | POA: Insufficient documentation

## 2015-05-13 DIAGNOSIS — K219 Gastro-esophageal reflux disease without esophagitis: Secondary | ICD-10-CM | POA: Insufficient documentation

## 2015-05-23 ENCOUNTER — Other Ambulatory Visit: Payer: Self-pay | Admitting: Family Medicine

## 2015-05-23 DIAGNOSIS — K219 Gastro-esophageal reflux disease without esophagitis: Secondary | ICD-10-CM

## 2015-06-06 ENCOUNTER — Encounter: Payer: Self-pay | Admitting: Family Medicine

## 2015-06-06 ENCOUNTER — Ambulatory Visit (INDEPENDENT_AMBULATORY_CARE_PROVIDER_SITE_OTHER): Payer: Medicare Other | Admitting: Family Medicine

## 2015-06-06 VITALS — BP 104/52 | HR 56 | Temp 98.4°F | Resp 16 | Ht 60.0 in | Wt 177.0 lb

## 2015-06-06 DIAGNOSIS — R634 Abnormal weight loss: Secondary | ICD-10-CM | POA: Diagnosis not present

## 2015-06-06 DIAGNOSIS — E039 Hypothyroidism, unspecified: Secondary | ICD-10-CM | POA: Diagnosis not present

## 2015-06-06 DIAGNOSIS — G4733 Obstructive sleep apnea (adult) (pediatric): Secondary | ICD-10-CM | POA: Diagnosis not present

## 2015-06-06 DIAGNOSIS — I509 Heart failure, unspecified: Secondary | ICD-10-CM

## 2015-06-06 DIAGNOSIS — G7000 Myasthenia gravis without (acute) exacerbation: Secondary | ICD-10-CM | POA: Diagnosis not present

## 2015-06-06 DIAGNOSIS — I10 Essential (primary) hypertension: Secondary | ICD-10-CM | POA: Diagnosis not present

## 2015-06-06 MED ORDER — DILTIAZEM HCL ER 180 MG PO CP24: 180.0000 mg | ORAL_CAPSULE | Freq: Every day | ORAL | Status: DC

## 2015-06-06 NOTE — Progress Notes (Signed)
Subjective:    Patient ID: Julia Crawford, female    DOB: Sep 01, 1935, 80 y.o.   MRN: [id number]  Congestive Heart Failure Presents for follow-up visit. Pertinent negatives include no abdominal pain, chest pain, chest pressure, edema, fatigue, muscle weakness, near-syncope, nocturia, palpitations, paroxysmal nocturnal dyspnea, shortness of breath or unexpected weight change. The symptoms have been stable. Past treatments include ACE inhibitors, digoxin, exercise and oxygen. The treatment provided significant relief. Compliance with prior treatments has been good.  Hypertension This is a chronic problem. The problem has been gradually improving since onset. The problem is controlled. Pertinent negatives include no chest pain, headaches, neck pain, palpitations or shortness of breath. Risk factors for coronary artery disease include diabetes mellitus, obesity and dyslipidemia. There are no compliance problems.     Patient Active Problem List   Diagnosis Date Noted  . Myasthenia gravis 06/06/2015  . Abnormal weight loss 06/06/2015  . Routine general medical examination at a health care facility 05/13/2015  . Body mass index of 60 or higher 05/13/2015  . Congestive heart failure due to high blood pressure 05/13/2015  . Acid reflux 05/13/2015  . Hemorrhoids, internal 05/13/2015  . High potassium 05/13/2015  . Candida infection of mouth 05/13/2015  . Arthritis of knee, degenerative 05/13/2015  . Absence of bladder continence 05/13/2015  . CHF (congestive heart failure) 04/14/2015  . Atrial fibrillation 04/14/2015  . CAFL (chronic airflow limitation) 09/24/2014  . Essential (primary) hypertension 07/19/2014  . Obstructive apnea 02/04/2012  . 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 Awareness of heartbeats 07/11/2009  . Allergic rhinitis 03/28/2009  . Diabetes 03/28/2009  . Megaloblastic anemia due to B12 deficiency 12/06/2008  . 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 OP  (osteoporosis) 06/08/2004  . Airway hyperreactivity 05/28/2000  . 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 Adult hypothyroidism 12/10/1994   Family History  Problem Relation Age of Onset  . CAD Mother   . Diabetes Father   . CAD Brother   . Diabetes Brother    History   Social History  . Marital Status: Single    Spouse Name: N/A  . Number of Children: 5  . Years of Education: 11th grade   Occupational History  . Retired    Social History Main Topics  . Smoking status: Never Smoker   . Smokeless tobacco: Not on file  . Alcohol Use: No  . Drug Use: No  . Sexual Activity: Not Currently   Other Topics Concern  . Not on file   Social History Narrative   Past Surgical History  Procedure Laterality Date  . Tonsillectomy    . Partial hysterectomy    . Cataract extraction    . Breast biopsy     Allergies  Allergen Reactions  . Ace Inhibitors     Other reaction(s): Unknown  . Apixaban     Other reaction(s): Unknown  . Codeine Itching and Swelling  . Doxycycline   . Peanut Butter Flavor     rash?  . Telithromycin     Other reaction(s): Other (See Comments) Myasthenia Gravis Patient   Previous Medications   ASPIRIN 81 MG TABLET    Take 1 tablet by mouth daily.   AZATHIOPRINE (IMURAN) 50 MG TABLET    AZATHIOPRINE, 50MG  (Oral Tablet)  2 1/2 Every Day for 0 days  Quantity: 0.00;  Refills: 0   Ordered :20-Nov-2010  Edmonia James ;  Started 27-Jul-2008 Active Comments: DX: 358.00   CYANOCOBALAMIN 1000 MCG TABLET  Take 1 tablet by mouth daily.   DILTIAZEM (DILACOR XR) 240 MG 24 HR CAPSULE    Take 1 capsule by mouth daily.   FLUTICASONE-SALMETEROL (ADVAIR) 250-50 MCG/DOSE AEPB    Inhale 1 puff into the lungs 2 (two) times daily.   FUROSEMIDE (LASIX) 20 MG TABLET    Take 1 tablet by mouth daily.   LEVOTHYROXINE (SYNTHROID, LEVOTHROID) 100 MCG TABLET    Take 1 tablet by mouth daily.   LISINOPRIL (PRINIVIL,ZESTRIL) 5 MG TABLET    Take 1 tablet by mouth daily.    METFORMIN (GLUCOPHAGE) 500 MG TABLET    Take 1 tablet by mouth 3 (three) times daily.   METOCLOPRAMIDE (REGLAN) 10 MG TABLET    TAKE 1 TABLET BY MOUTH TWICE A DAY   OMEPRAZOLE (PRILOSEC) 20 MG CAPSULE    Take 1 capsule by mouth daily.   PYRIDOSTIGMINE (MESTINON) 60 MG TABLET    Take 1 tablet by mouth 3 (three) times daily.   SIMVASTATIN (ZOCOR) 10 MG TABLET    Take 1 tablet by mouth daily.       Review of Systems  Constitutional: Negative for fever, chills, diaphoresis, activity change, appetite change, fatigue and unexpected weight change.  Respiratory: Positive for cough (Secondary to allergies). Negative for apnea, choking, chest tightness, shortness of breath, wheezing and stridor.   Cardiovascular: Negative for chest pain, palpitations, leg swelling and near-syncope.  Gastrointestinal: Negative for nausea, vomiting, abdominal pain, diarrhea, constipation, blood in stool, abdominal distention, anal bleeding and rectal pain.  Endocrine: Negative for cold intolerance, heat intolerance, polydipsia, polyphagia and polyuria.  Genitourinary: Negative for nocturia.  Musculoskeletal: Negative for myalgias, back pain, joint swelling, arthralgias, gait problem, muscle weakness, neck pain and neck stiffness.  Neurological: Negative for dizziness, tremors, seizures, syncope, facial asymmetry, speech difficulty, weakness, light-headedness, numbness and headaches.       Objective:   Physical Exam  Constitutional: She appears well-developed and well-nourished.  Neck: Normal range of motion. No thyromegaly present.  Cardiovascular: Normal rate and regular rhythm.   Pulmonary/Chest: Effort normal and breath sounds normal.  Lymphadenopathy:    She has no cervical adenopathy.    She has no axillary adenopathy.  Psychiatric: She has a normal mood and affect. Her behavior is normal. Thought content normal.    BP 104/52 mmHg  Pulse 56  Temp(Src) 98.4 F (36.9 C) (Oral)  Resp 16  Ht 5' (1.524 m)   Wt 177 lb (80.287 kg)  BMI 34.57 kg/m2       Assessment & Plan:  1. Congestive heart failure, unspecified congestive heart failure chronicity, unspecified congestive heart failure type Stable. Doing very well.   2. Obstructive apnea Using CPAP nightly.    3. Myasthenia gravis Check labs for Dr. Nadara Mustard.  - CBC with Differential/Platelet  4. Essential (primary) hypertension BP low today, will try to slowly decrease her Diltiazem. Recheck in 2 months.  - Comprehensive metabolic panel - diltiazem (DILACOR XR) 180 MG 24 hr capsule; Take 1 capsule (180 mg total) by mouth daily.  Dispense: 30 capsule; Refill: 5  5. Abnormal weight loss Concerning how successful she has been with weight loss.  Will check scans. Further plan pending these results.  - DG Chest 2 View - CT Abdomen Pelvis W Contrast  6. Hypothyroidism, unspecified hypothyroidism type Check labs.  - TSH  Patient was seen and examined by Jerrell Belfast, MD, and note scribed by Ashley Royalty, CMA.   I have reviewed the document for accuracy and completeness  and I agree with above. Jerrell Belfast, MD   Margarita Rana, MD

## 2015-06-07 ENCOUNTER — Ambulatory Visit
Admission: RE | Admit: 2015-06-07 | Discharge: 2015-06-07 | Disposition: A | Discharge status: Home / Self Care | Payer: Medicare Other | Source: Ambulatory Visit | Attending: Family Medicine | Admitting: Family Medicine

## 2015-06-07 DIAGNOSIS — R634 Abnormal weight loss: Secondary | ICD-10-CM | POA: Diagnosis present

## 2015-06-07 DIAGNOSIS — I509 Heart failure, unspecified: Secondary | ICD-10-CM | POA: Insufficient documentation

## 2015-06-07 LAB — CBC WITH DIFFERENTIAL/PLATELET
Basophils Absolute: 0.1 10*3/uL (ref 0.0–0.2)
Basos: 2 %
EOS (ABSOLUTE): 0.2 10*3/uL (ref 0.0–0.4)
EOS: 5 %
HEMATOCRIT: 34.3 % (ref 34.0–46.6)
Hemoglobin: 11.8 g/dL (ref 11.1–15.9)
IMMATURE GRANS (ABS): 0 10*3/uL (ref 0.0–0.1)
Immature Granulocytes: 0 %
LYMPHS ABS: 1.4 10*3/uL (ref 0.7–3.1)
Lymphs: 27 %
MCH: 32.6 pg (ref 26.6–33.0)
MCHC: 34.4 g/dL (ref 31.5–35.7)
MCV: 95 fL (ref 79–97)
Monocytes Absolute: 0.8 10*3/uL (ref 0.1–0.9)
Monocytes: 15 %
NEUTROS ABS: 2.6 10*3/uL (ref 1.4–7.0)
Neutrophils: 51 %
PLATELETS: 261 10*3/uL (ref 150–379)
RBC: 3.62 x10E6/uL — AB (ref 3.77–5.28)
RDW: 17.4 % — ABNORMAL HIGH (ref 12.3–15.4)
WBC: 5.2 10*3/uL (ref 3.4–10.8)

## 2015-06-07 LAB — COMPREHENSIVE METABOLIC PANEL
ALT: 12 IU/L (ref 0–32)
AST: 22 IU/L (ref 0–40)
Albumin/Globulin Ratio: 1.4 (ref 1.1–2.5)
Albumin: 4.2 g/dL (ref 3.5–4.8)
Alkaline Phosphatase: 63 IU/L (ref 39–117)
BILIRUBIN TOTAL: 0.9 mg/dL (ref 0.0–1.2)
BUN / CREAT RATIO: 22 (ref 11–26)
BUN: 13 mg/dL (ref 8–27)
CHLORIDE: 102 mmol/L (ref 97–108)
CO2: 23 mmol/L (ref 18–29)
Calcium: 9.3 mg/dL (ref 8.7–10.3)
Creatinine, Ser: 0.58 mg/dL (ref 0.57–1.00)
GFR calc Af Amer: 102 mL/min/{1.73_m2} (ref >59)
GFR calc non Af Amer: 89 mL/min/{1.73_m2} (ref >59)
GLUCOSE: 94 mg/dL (ref 65–99)
Globulin, Total: 3.1 g/dL (ref 1.5–4.5)
POTASSIUM: 4.6 mmol/L (ref 3.5–5.2)
Sodium: 143 mmol/L (ref 134–144)
TOTAL PROTEIN: 7.3 g/dL (ref 6.0–8.5)

## 2015-06-07 LAB — TSH: TSH: 2.23 u[IU]/mL (ref 0.450–4.500)

## 2015-06-08 ENCOUNTER — Telehealth: Payer: Self-pay

## 2015-06-08 NOTE — Telephone Encounter (Signed)
-----   Message from Margarita Rana, MD sent at 06/07/2015 12:12 PM EDT ----- Labs stable. Please notify patient. Thanks.

## 2015-06-08 NOTE — Telephone Encounter (Signed)
Spoke with Shawn at [facility] CT, states pt did not have an appointment scheduled for abd CT, but pt did pick up prep kit. FYI, Shawn ensured me that the hemidiaphragm WILL be visualized as part of the scan. [employer], and spoke with Amy. Scheduled pt's CT for Wednesday, July 6 at 1400 at [facility]. Informed Ms. Deneise Lever of the appointment, and advised her to follow the instructions of the prep kit, do not eat solids 4 hours prior to scan, and to be at [facility] 15 minutes before appointment time. Pt verbally acknowledges understanding. Renaldo Fiddler, CMA

## 2015-06-08 NOTE — Telephone Encounter (Signed)
Pt advised as directed below.  Faxed a copy to Dr. Nadara Mustard.   Thanks,   -[name]

## 2015-06-09 ENCOUNTER — Telehealth: Payer: Self-pay | Admitting: Family Medicine

## 2015-06-09 NOTE — Telephone Encounter (Signed)
Patient advised as below.  

## 2015-06-09 NOTE — Telephone Encounter (Signed)
Pt called wanting to know if Dr. Venia Minks will take her off the omeprazole.  She has heard that it causes early dementia.  Please call her back at [phone]  tp

## 2015-06-09 NOTE — Telephone Encounter (Signed)
Can stop. May not need it anymore as weight loss helps reflux. Thank.s

## 2015-06-15 ENCOUNTER — Ambulatory Visit
Admission: RE | Admit: 2015-06-15 | Discharge: 2015-06-15 | Disposition: A | Discharge status: Home / Self Care | Payer: Medicare Other | Source: Ambulatory Visit | Attending: Family Medicine | Admitting: Family Medicine

## 2015-06-15 DIAGNOSIS — R634 Abnormal weight loss: Secondary | ICD-10-CM | POA: Insufficient documentation

## 2015-06-15 MED ORDER — IOHEXOL 300 MG/ML  SOLN
100.0000 mL | Freq: Once | INTRAMUSCULAR | Status: AC | PRN
  Administered 2015-06-15: 100 mL via INTRAVENOUS

## 2015-06-20 ENCOUNTER — Ambulatory Visit
Admission: RE | Admit: 2015-06-20 | Discharge: 2015-06-20 | Disposition: A | Discharge status: Home / Self Care | Payer: Medicare Other | Source: Ambulatory Visit | Attending: Family Medicine | Admitting: Family Medicine

## 2015-06-20 ENCOUNTER — Encounter: Payer: Self-pay | Admitting: Family Medicine

## 2015-06-20 ENCOUNTER — Ambulatory Visit (INDEPENDENT_AMBULATORY_CARE_PROVIDER_SITE_OTHER): Payer: Medicare Other | Admitting: Family Medicine

## 2015-06-20 VITALS — BP 135/68 | HR 64 | Temp 98.2°F | Resp 22 | Ht 60.0 in | Wt 181.0 lb

## 2015-06-20 DIAGNOSIS — M479 Spondylosis, unspecified: Secondary | ICD-10-CM | POA: Diagnosis not present

## 2015-06-20 DIAGNOSIS — R059 Cough, unspecified: Secondary | ICD-10-CM

## 2015-06-20 DIAGNOSIS — R0602 Shortness of breath: Secondary | ICD-10-CM

## 2015-06-20 DIAGNOSIS — I509 Heart failure, unspecified: Secondary | ICD-10-CM

## 2015-06-20 DIAGNOSIS — R05 Cough: Secondary | ICD-10-CM | POA: Insufficient documentation

## 2015-06-20 DIAGNOSIS — I517 Cardiomegaly: Secondary | ICD-10-CM | POA: Insufficient documentation

## 2015-06-20 DIAGNOSIS — J9811 Atelectasis: Secondary | ICD-10-CM | POA: Insufficient documentation

## 2015-06-20 NOTE — Progress Notes (Signed)
Subjective:    Patient ID: Julia Crawford, female    DOB: [birth year], 80 y.o.   MRN: [id number]  Cough This is a chronic problem. The current episode started more than 1 month ago. The problem has been unchanged. The problem occurs constantly. The cough is productive of sputum. Associated symptoms include postnasal drip. Pertinent negatives include no chest pain, chills, ear congestion, ear pain, fever, headaches, nasal congestion, rash, rhinorrhea, sore throat, shortness of breath or wheezing. The symptoms are aggravated by lying down. She has tried steroid inhaler for the symptoms. The treatment provided mild relief.      Review of Systems  Constitutional: Negative for fever and chills.  HENT: Positive for postnasal drip. Negative for ear pain, rhinorrhea and sore throat.   Respiratory: Positive for cough. Negative for shortness of breath and wheezing.   Cardiovascular: Negative for chest pain.  Skin: Negative for rash.  Neurological: Negative for headaches.    Patient Active Problem List   Diagnosis Date Noted  . Shortness of breath 06/20/2015  . Myasthenia gravis 06/06/2015  . Abnormal weight loss 06/06/2015  . Routine general medical examination at a health care facility 05/13/2015  . Body mass index of 60 or higher 05/13/2015  . Congestive heart failure due to high blood pressure 05/13/2015  . Acid reflux 05/13/2015  . Hemorrhoids, internal 05/13/2015  . High potassium 05/13/2015  . Candida infection of mouth 05/13/2015  . Arthritis of knee, degenerative 05/13/2015  . Absence of bladder continence 05/13/2015  . CHF (congestive heart failure) 04/14/2015  . Atrial fibrillation 04/14/2015  . CAFL (chronic airflow limitation) 09/24/2014  . Essential (primary) hypertension 07/19/2014  . Obstructive apnea 02/04/2012  . 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 Awareness of heartbeats 07/11/2009  . Allergic rhinitis 03/28/2009  . Diabetes 03/28/2009  .  Megaloblastic anemia due to B12 deficiency 12/06/2008  . 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 OP (osteoporosis) 06/08/2004  . Airway hyperreactivity 05/28/2000  . 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 Adult hypothyroidism 12/10/1994   Past Medical History  Diagnosis Date  . Hypertension   . Coronary artery disease   . COPD (chronic obstructive pulmonary disease)   . Arthritis   . Diabetes mellitus without complication   . Myasthenia gravis   . Hypothyroid   . Irregular heart beat   . CHF (congestive heart failure) 04/14/2015  . Atrial fibrillation 04/14/2015  . GERD (gastroesophageal reflux disease)   . Emphysema of lung    Current Outpatient Prescriptions on File Prior to Visit  Medication Sig  . aspirin 81 MG tablet Take 1 tablet by mouth daily.  Marland Kitchen azaTHIOprine (IMURAN) 50 MG tablet AZATHIOPRINE, 50MG  (Oral Tablet)  2 1/2 Every Day for 0 days  Quantity: 0.00;  Refills: 0   Ordered :20-Nov-2010  Edmonia James ;  Started 27-Jul-2008 Active Comments: DX: 358.00  . cyanocobalamin 1000 MCG tablet Take 1 tablet by mouth daily.  Marland Kitchen diltiazem (DILACOR XR) 180 MG 24 hr capsule Take 1 capsule (180 mg total) by mouth daily.  . Fluticasone-Salmeterol (ADVAIR) 250-50 MCG/DOSE AEPB Inhale 1 puff into the lungs 2 (two) times daily.  . furosemide (LASIX) 20 MG tablet Take 1 tablet by mouth daily.  Marland Kitchen levothyroxine (SYNTHROID, LEVOTHROID) 100 MCG tablet Take 1 tablet by mouth daily.  Marland Kitchen lisinopril (PRINIVIL,ZESTRIL) 5 MG tablet Take 1 tablet by mouth daily.  . metFORMIN (GLUCOPHAGE) 500 MG tablet Take 1 tablet by mouth 3 (three) times daily.  . metoCLOPramide (REGLAN) 10  MG tablet TAKE 1 TABLET BY MOUTH TWICE A DAY  . pyridostigmine (MESTINON) 60 MG tablet Take 1 tablet by mouth 3 (three) times daily.  . simvastatin (ZOCOR) 10 MG tablet Take 1 tablet by mouth daily.  Marland Kitchen omeprazole (PRILOSEC) 20 MG capsule Take 1 capsule by mouth daily.   No current facility-administered medications on file  prior to visit.   Allergies  Allergen Reactions  . Ace Inhibitors     Other reaction(s): Unknown  . Apixaban     Other reaction(s): Unknown  . Codeine Itching and Swelling  . Doxycycline   . Peanut Butter Flavor     rash?  . Telithromycin     Other reaction(s): Other (See Comments) Myasthenia Gravis Patient   Past Surgical History  Procedure Laterality Date  . Tonsillectomy    . Partial hysterectomy    . Cataract extraction    . Breast biopsy     History   Social History  . Marital Status: Widowed    Spouse Name: N/A  . Number of Children: 5  . Years of Education: 11th grade   Occupational History  . Retired    Social History Main Topics  . Smoking status: Never Smoker   . Smokeless tobacco: Never Used  . Alcohol Use: No  . Drug Use: No  . Sexual Activity: Not Currently   Other Topics Concern  . Not on file   Social History Narrative   Family History  Problem Relation Age of Onset  . CAD Mother   . Diabetes Father   . CAD Brother   . Diabetes Brother    Wt Readings from Last 3 Encounters:  06/20/15 181 lb (82.101 kg)  06/06/15 177 lb (80.287 kg)  04/20/15 191 lb (86.637 kg)      Objective:   Physical Exam  Constitutional: She appears well-developed and well-nourished.  Neck: Normal range of motion. No thyromegaly present.  Cardiovascular: Normal rate and regular rhythm.   Pulmonary/Chest: Effort normal and breath sounds normal.  Lymphadenopathy:    She has no cervical adenopathy.    She has no axillary adenopathy.  Psychiatric: She has a normal mood and affect. Her behavior is normal. Thought content normal.    BP 135/68 mmHg  Pulse 64  Temp(Src) 98.2 F (36.8 C) (Oral)  Resp 22  Ht 5' (1.524 m)  Wt 181 lb (82.101 kg)  BMI 35.35 kg/m2  SpO2 95%      Assessment & Plan:  1. Cough Will recheck labs and CXR.   Unclear if CHF, do not think related to ACE.  Does not cough in the day.   Will assess after work up. Please call back if  condition worsens or does not continue to improve.    - DG Chest 2 View; Future - CBC with Differential/Platelet  2. Congestive heart failure, unspecified congestive heart failure chronicity, unspecified congestive heart failure type Worsening.  - Comprehensive metabolic panel - B Nat Peptide  3. Shortness of breath Check labs as above.    Margarita Rana, MD

## 2015-06-21 LAB — CBC WITH DIFFERENTIAL/PLATELET
BASOS ABS: 0 10*3/uL (ref 0.0–0.2)
BASOS: 1 %
EOS (ABSOLUTE): 0.2 10*3/uL (ref 0.0–0.4)
Eos: 4 %
Hematocrit: 35.8 % (ref 34.0–46.6)
Hemoglobin: 12 g/dL (ref 11.1–15.9)
Immature Grans (Abs): 0 10*3/uL (ref 0.0–0.1)
Immature Granulocytes: 0 %
LYMPHS: 27 %
Lymphocytes Absolute: 1.3 10*3/uL (ref 0.7–3.1)
MCH: 32.7 pg (ref 26.6–33.0)
MCHC: 33.5 g/dL (ref 31.5–35.7)
MCV: 98 fL — ABNORMAL HIGH (ref 79–97)
MONOCYTES: 15 %
Monocytes Absolute: 0.8 10*3/uL (ref 0.1–0.9)
NEUTROS ABS: 2.6 10*3/uL (ref 1.4–7.0)
Neutrophils: 53 %
Platelets: 324 10*3/uL (ref 150–379)
RBC: 3.67 x10E6/uL — ABNORMAL LOW (ref 3.77–5.28)
RDW: 17.5 % — AB (ref 12.3–15.4)
WBC: 4.9 10*3/uL (ref 3.4–10.8)

## 2015-06-21 LAB — COMPREHENSIVE METABOLIC PANEL
ALBUMIN: 4 g/dL (ref 3.5–4.8)
ALT: 15 IU/L (ref 0–32)
AST: 24 IU/L (ref 0–40)
Albumin/Globulin Ratio: 1.4 (ref 1.1–2.5)
Alkaline Phosphatase: 65 IU/L (ref 39–117)
BILIRUBIN TOTAL: 0.9 mg/dL (ref 0.0–1.2)
BUN/Creatinine Ratio: 17 (ref 11–26)
BUN: 11 mg/dL (ref 8–27)
CO2: 24 mmol/L (ref 18–29)
CREATININE: 0.65 mg/dL (ref 0.57–1.00)
Calcium: 9.8 mg/dL (ref 8.7–10.3)
Chloride: 100 mmol/L (ref 97–108)
GFR calc non Af Amer: 85 mL/min/{1.73_m2} (ref >59)
GFR, EST AFRICAN AMERICAN: 98 mL/min/{1.73_m2} (ref >59)
GLUCOSE: 84 mg/dL (ref 65–99)
Globulin, Total: 2.9 g/dL (ref 1.5–4.5)
POTASSIUM: 5.3 mmol/L — AB (ref 3.5–5.2)
Sodium: 138 mmol/L (ref 134–144)
TOTAL PROTEIN: 6.9 g/dL (ref 6.0–8.5)

## 2015-06-21 LAB — BRAIN NATRIURETIC PEPTIDE: BNP: 423 pg/mL — ABNORMAL HIGH (ref 0.0–100.0)

## 2015-06-22 ENCOUNTER — Telehealth: Payer: Self-pay

## 2015-06-22 NOTE — Telephone Encounter (Signed)
-----   Message from Margarita Rana, MD sent at 06/21/2015  4:51 PM EDT ----- CXR and labs fairly stable. Please see how patient is feeling. Thanks.

## 2015-06-22 NOTE — Telephone Encounter (Signed)
Informed pt of results. Pt reports she is feeling better. Renaldo Fiddler, CMA

## 2015-07-08 ENCOUNTER — Other Ambulatory Visit: Payer: Self-pay | Admitting: Family Medicine

## 2015-07-08 DIAGNOSIS — I509 Heart failure, unspecified: Secondary | ICD-10-CM

## 2015-07-08 DIAGNOSIS — E039 Hypothyroidism, unspecified: Secondary | ICD-10-CM

## 2015-08-08 ENCOUNTER — Ambulatory Visit: Payer: Self-pay | Admitting: Family Medicine

## 2015-08-13 ENCOUNTER — Other Ambulatory Visit: Payer: Self-pay | Admitting: Family Medicine

## 2015-08-13 DIAGNOSIS — E785 Hyperlipidemia, unspecified: Secondary | ICD-10-CM

## 2015-08-16 ENCOUNTER — Ambulatory Visit: Payer: Self-pay | Admitting: Family Medicine

## 2015-08-16 DIAGNOSIS — E785 Hyperlipidemia, unspecified: Secondary | ICD-10-CM | POA: Insufficient documentation

## 2015-08-17 ENCOUNTER — Other Ambulatory Visit: Payer: Self-pay | Admitting: Family Medicine

## 2015-08-17 DIAGNOSIS — E119 Type 2 diabetes mellitus without complications: Secondary | ICD-10-CM

## 2015-08-19 ENCOUNTER — Ambulatory Visit: Payer: Self-pay | Admitting: Family Medicine

## 2015-08-19 ENCOUNTER — Encounter: Payer: Self-pay | Admitting: Family Medicine

## 2015-08-19 ENCOUNTER — Ambulatory Visit (INDEPENDENT_AMBULATORY_CARE_PROVIDER_SITE_OTHER): Payer: Medicare Other | Admitting: Family Medicine

## 2015-08-19 VITALS — BP 118/68 | HR 60 | Temp 98.3°F | Resp 16

## 2015-08-19 DIAGNOSIS — J209 Acute bronchitis, unspecified: Secondary | ICD-10-CM | POA: Diagnosis not present

## 2015-08-19 DIAGNOSIS — R059 Cough, unspecified: Secondary | ICD-10-CM

## 2015-08-19 DIAGNOSIS — G7000 Myasthenia gravis without (acute) exacerbation: Secondary | ICD-10-CM | POA: Diagnosis not present

## 2015-08-19 DIAGNOSIS — E785 Hyperlipidemia, unspecified: Secondary | ICD-10-CM | POA: Diagnosis not present

## 2015-08-19 DIAGNOSIS — I10 Essential (primary) hypertension: Secondary | ICD-10-CM | POA: Diagnosis not present

## 2015-08-19 DIAGNOSIS — R05 Cough: Secondary | ICD-10-CM | POA: Diagnosis not present

## 2015-08-19 DIAGNOSIS — E119 Type 2 diabetes mellitus without complications: Secondary | ICD-10-CM | POA: Diagnosis not present

## 2015-08-19 LAB — POCT GLYCOSYLATED HEMOGLOBIN (HGB A1C): Hemoglobin A1C: 5.9

## 2015-08-19 MED ORDER — AZITHROMYCIN 250 MG PO TABS: ORAL_TABLET | ORAL | Status: DC

## 2015-08-19 NOTE — Progress Notes (Signed)
Patient ID: Julia Crawford, female   DOB: [birth year], 80 y.o.   MRN: [id number]        [name] Female    DOB: 11-Jan-1935   80 y.o.   MRN: [id number] Visit Date: 08/19/2015  Today's Provider: Margarita Rana, MD   Chief Complaint  Patient presents with  . Diabetes  . Hyperlipidemia  . Cough   Subjective:    Diabetes She presents for her follow-up diabetic visit. She has type 2 diabetes mellitus. Her disease course has been stable. There are no hypoglycemic associated symptoms. Pertinent negatives for hypoglycemia include no dizziness, headaches or sweats. Associated symptoms include weight loss. Pertinent negatives for diabetes include no chest pain and no fatigue. Symptoms are stable. Current diabetic treatment includes oral agent (monotherapy). She is compliant with treatment all of the time. There is no change (Pt does not check her blood sugar at home. ) in her home blood glucose trend. She does not see a podiatrist.Eye exam is current.  Hyperlipidemia This is a chronic problem. The problem is controlled. Pertinent negatives include no chest pain, myalgias or shortness of breath. Current antihyperlipidemic treatment includes statins. There are no compliance problems.   Cough This is a chronic problem. The current episode started more than 1 month ago. The problem has been unchanged. The problem occurs constantly. The cough is non-productive. Associated symptoms include weight loss. Pertinent negatives include no chest pain, chills, ear congestion, ear pain, eye redness, fever, headaches, heartburn, hemoptysis, myalgias, nasal congestion, postnasal drip, rash, rhinorrhea, sore throat, shortness of breath, sweats or wheezing.   Cough at night. Antibiotic helped her sister-in-law. Wants to try it.   Lab Results  Component Value Date   CHOL 172 05/10/2014   HDL 66 05/10/2014   LDLCALC 86 05/10/2014   TRIG 100 05/10/2014   Lab Results  Component Value Date   HGBA1C 6.4*  04/25/2015       Allergies  Allergen Reactions  . Ace Inhibitors     Other reaction(s): Unknown  . Apixaban     Other reaction(s): Unknown  . Codeine Itching and Swelling  . Doxycycline   . Peanut Butter Flavor     rash?  . Telithromycin     Other reaction(s): Other (See Comments) Myasthenia Gravis Patient   Previous Medications   ASPIRIN 81 MG TABLET    Take 1 tablet by mouth daily.   AZATHIOPRINE (IMURAN) 50 MG TABLET    AZATHIOPRINE, 50MG  (Oral Tablet)  2 1/2 Every Day for 0 days  Quantity: 0.00;  Refills: 0   Ordered :20-Nov-2010  Julia Crawford ;  Started 27-Jul-2008 Active Comments: DX: 358.00   CYANOCOBALAMIN 1000 MCG TABLET    Take 1 tablet by mouth daily.   DILTIAZEM (CARDIZEM CD) 180 MG 24 HR CAPSULE    TAKE 1 CAPSULE (180 MG TOTAL) BY MOUTH DAILY.   FLUTICASONE-SALMETEROL (ADVAIR) 250-50 MCG/DOSE AEPB    Inhale 1 puff into the lungs 2 (two) times daily.   FUROSEMIDE (LASIX) 20 MG TABLET    TAKE 1 TABLET EVERY DAY   LEVOTHYROXINE (SYNTHROID, LEVOTHROID) 100 MCG TABLET    TAKE 1 TABLET EVERY DAY   LISINOPRIL (PRINIVIL,ZESTRIL) 5 MG TABLET    Take 1 tablet by mouth daily.   METOCLOPRAMIDE (REGLAN) 10 MG TABLET    TAKE 1 TABLET BY MOUTH TWICE A DAY   OMEPRAZOLE (PRILOSEC) 20 MG CAPSULE    Take 1 capsule by mouth daily.   PYRIDOSTIGMINE (MESTINON) 60 MG TABLET  Take 1 tablet by mouth 3 (three) times daily.   SIMVASTATIN (ZOCOR) 10 MG TABLET    TAKE 1 TABLET AT BEDTIME    Review of Systems  Constitutional: Positive for weight loss, appetite change and unexpected weight change. Negative for fever, chills, diaphoresis, activity change and fatigue.  HENT: Positive for sneezing and voice change. Negative for congestion, ear discharge, ear pain, facial swelling, hearing loss, mouth sores, nosebleeds, postnasal drip, rhinorrhea, sinus pressure, sore throat, tinnitus and trouble swallowing.   Eyes: Negative for photophobia, pain, discharge, redness, itching and visual  disturbance.  Respiratory: Positive for cough. Negative for apnea, hemoptysis, choking, chest tightness, shortness of breath, wheezing and stridor.   Cardiovascular: Negative for chest pain, palpitations and leg swelling.  Gastrointestinal: Negative for heartburn, nausea, vomiting, abdominal pain, diarrhea, constipation, blood in stool, abdominal distention, anal bleeding and rectal pain.  Musculoskeletal: Negative for myalgias.  Skin: Negative for rash.  Neurological: Negative for dizziness, light-headedness and headaches.    Social History  Substance Use Topics  . Smoking status: Never Smoker   . Smokeless tobacco: Never Used  . Alcohol Use: No   Objective:   BP 118/68 mmHg  Pulse 60  Temp(Src) 98.3 F (36.8 C)  Resp 16  Physical Exam  Constitutional: She is oriented to person, place, and time. She appears well-developed and well-nourished.  Cardiovascular: Normal rate and regular rhythm.   Murmur heard. Pulmonary/Chest: Effort normal and breath sounds normal.  Neurological: She is alert and oriented to person, place, and time.  Psychiatric: She has a normal mood and affect. Her behavior is normal. Judgment and thought content normal.      Assessment & Plan:     1. Essential (primary) hypertension Stable. Continue medication.   2. Type 2 diabetes mellitus without complication Doing very well. Will stop Metformin, as is having decreased appetite and sugars looks so good. Patient thinks weight loss and decreased appetite related to current living situation with daughter. Does not like her husband. Is moving in two weeks and thinks things will improve then Will recheck in 6 weeks.  - POCT glycosylated hemoglobin (Hb A1C) Results for orders placed or performed in visit on 08/19/15  POCT glycosylated hemoglobin (Hb A1C)  Result Value Ref Range   Hemoglobin A1C 5.9     3. Hyperlipemia Stable.   4. Cough See next. Thinks cough related to infection.   5. Acute bronchitis,  unspecified organism Condition is worsening. Will start medication for better control.  Will recheck in 4 to 6 weeks. Previous CXR normal. Weight loss still concerning. May need to repeat previous negative work up . Hopefully will improve with change in living situation.  - azithromycin (ZITHROMAX) 250 MG tablet; 2 today and then one a day for 4 more days.  Dispense: 6 tablet; Refill: 0  6. Myasthenia gravis Check labs.  - CBC with Differential/Platelet     Patient was seen and examined by Jerrell Belfast, MD, and note scribed, in part,  by Ashley Royalty, [place].   I have reviewed the document for accuracy and completeness and I agree with above. - Jerrell Belfast, MD  Margarita Rana, MD  [facility]

## 2015-08-24 ENCOUNTER — Other Ambulatory Visit: Payer: Self-pay | Admitting: Family Medicine

## 2015-08-24 DIAGNOSIS — J449 Chronic obstructive pulmonary disease, unspecified: Secondary | ICD-10-CM

## 2015-08-26 ENCOUNTER — Telehealth: Payer: Self-pay | Admitting: Family Medicine

## 2015-08-26 NOTE — Telephone Encounter (Signed)
Pt contacted office for refill request on the following medications:  celecoxib (CELEBREX) 200 MG.  [facility].  CB#[id number]/MW  Pt states she is some better but since coughing/MW

## 2015-08-26 NOTE — Telephone Encounter (Signed)
Tried calling patient to verify medication that was requested to be refilled. We do not have Celebrex listed on patient's med list. Was wondering if patient meant azithromycin due to still having cough?? Patient was prescribed Zpak last week due to sinuitis. Will try again later.

## 2015-08-30 ENCOUNTER — Telehealth: Payer: Self-pay

## 2015-08-30 LAB — CBC WITH DIFFERENTIAL/PLATELET
Basophils Absolute: 0 10*3/uL (ref 0.0–0.2)
Basos: 0 %
EOS (ABSOLUTE): 0.2 10*3/uL (ref 0.0–0.4)
EOS: 4 %
HEMOGLOBIN: 13.3 g/dL (ref 11.1–15.9)
Hematocrit: 39.6 % (ref 34.0–46.6)
IMMATURE GRANULOCYTES: 0 %
Immature Grans (Abs): 0 10*3/uL (ref 0.0–0.1)
Lymphocytes Absolute: 1.5 10*3/uL (ref 0.7–3.1)
Lymphs: 33 %
MCH: 32.7 pg (ref 26.6–33.0)
MCHC: 33.6 g/dL (ref 31.5–35.7)
MCV: 97 fL (ref 79–97)
MONOCYTES: 19 %
Monocytes Absolute: 0.9 10*3/uL (ref 0.1–0.9)
NEUTROS PCT: 44 %
Neutrophils Absolute: 2.1 10*3/uL (ref 1.4–7.0)
Platelets: 299 10*3/uL (ref 150–379)
RBC: 4.07 x10E6/uL (ref 3.77–5.28)
RDW: 17 % — ABNORMAL HIGH (ref 12.3–15.4)
WBC: 4.6 10*3/uL (ref 3.4–10.8)

## 2015-08-30 NOTE — Telephone Encounter (Signed)
LMTCB 08/30/2015  Thanks,   -[name]

## 2015-08-30 NOTE — Telephone Encounter (Signed)
-----   Message from Margarita Rana, MD sent at 08/30/2015  7:31 AM EDT ----- CBC normal. Please notify patient. Thanks.

## 2015-08-30 NOTE — Telephone Encounter (Signed)
Tried calling; no answer.   Thanks,   -Laura  

## 2015-08-31 NOTE — Telephone Encounter (Signed)
Tried Calling; no answer. 08/31/2015  Thanks,   -[name]

## 2015-08-31 NOTE — Telephone Encounter (Signed)
Tried calling; no answer.   Thanks,   -Laura  

## 2015-09-01 NOTE — Telephone Encounter (Signed)
Unable to reach patient at this time phone continually rings, will mail letter if unable to reach tomorrow. KW

## 2015-09-02 ENCOUNTER — Telehealth: Payer: Self-pay | Admitting: Family Medicine

## 2015-09-02 NOTE — Telephone Encounter (Signed)
Advised as directed below. Pt requested for lab results to be faxed to Dr. Nadara Mustard, her neurologist.  Thanks,

## 2015-09-22 ENCOUNTER — Other Ambulatory Visit: Payer: Self-pay | Admitting: Family Medicine

## 2015-09-22 ENCOUNTER — Telehealth: Payer: Self-pay

## 2015-09-22 DIAGNOSIS — I10 Essential (primary) hypertension: Secondary | ICD-10-CM

## 2015-09-22 NOTE — Telephone Encounter (Signed)
FYI pt called to say that Dr. Clayborn Bigness D/C pt's Lisinopril yesterday (09/21/2015) due to cough. Renaldo Fiddler, CMA

## 2015-09-22 NOTE — Telephone Encounter (Signed)
Pt has apt for 10/14/2015  Thanks,   -[name]

## 2015-09-22 NOTE — Telephone Encounter (Signed)
Recommended recheck ov in 4 weeks. Thanks.

## 2015-10-03 ENCOUNTER — Telehealth: Payer: Self-pay | Admitting: Family Medicine

## 2015-10-03 NOTE — Telephone Encounter (Signed)
Needs OV. Really not sure what to make of all of this. Thanks.

## 2015-10-03 NOTE — Telephone Encounter (Signed)
Pt reports she doesn't have a reliable ride since moving to her apartment, but does have appointment on 11/04. Pt would like to wait until then for OV. Renaldo Fiddler, CMA

## 2015-10-03 NOTE — Telephone Encounter (Signed)
Pt reports the pain has stopped since she D/C the Lasix 2 days ago. Pt reports her sx are an aching right sided chest pain that radiates to the right side of her back. Pt denies cough, SOB, N/V. States it is a "miserble feeling", and is requesting either a change in therapy or to D/C the Lasix. Advised pt to not take Lasix anymore. Renaldo Fiddler, CMA

## 2015-10-03 NOTE — Telephone Encounter (Signed)
Pt called stating that the "fluid" pills are causing her back and chest to hurt.  She said that she has stopped taking them.  Call back is  [phone]  Thanks, Con Memos

## 2015-10-14 ENCOUNTER — Encounter: Payer: Self-pay | Admitting: Family Medicine

## 2015-10-14 ENCOUNTER — Ambulatory Visit (INDEPENDENT_AMBULATORY_CARE_PROVIDER_SITE_OTHER): Payer: Medicare Other | Admitting: Family Medicine

## 2015-10-14 VITALS — BP 128/60 | HR 48 | Temp 97.8°F | Resp 14 | Wt 174.0 lb

## 2015-10-14 DIAGNOSIS — G7000 Myasthenia gravis without (acute) exacerbation: Secondary | ICD-10-CM | POA: Diagnosis not present

## 2015-10-14 DIAGNOSIS — I509 Heart failure, unspecified: Secondary | ICD-10-CM

## 2015-10-14 DIAGNOSIS — R001 Bradycardia, unspecified: Secondary | ICD-10-CM | POA: Diagnosis not present

## 2015-10-14 DIAGNOSIS — I482 Chronic atrial fibrillation, unspecified: Secondary | ICD-10-CM

## 2015-10-14 MED ORDER — DILTIAZEM HCL ER 120 MG PO CP24: 120.0000 mg | ORAL_CAPSULE | Freq: Every day | ORAL | Status: DC

## 2015-10-14 NOTE — Progress Notes (Signed)
Patient ID: SHAKEIA KRUS, female   DOB: Feb 03, 1935, 80 y.o.   MRN: [id number]    Subjective:  HPI  Diabetes follow up: Patient was last seen on September 9th. Her Metformin was stopped due to decreased appetitte and A1C was 5.9. She states her appetite is better. There was some concern with weight loss per previous note. She weighs 174lb today weight from last visit was not entered but patient thinks it was 171lb. She has not been checking her sugars.  Happy since she changed her weight.   Cough follow up: This has improved. She saw Dr. Clayborn Bigness and he stopped Lisinopril to see if the issue improves.  Patient was also having issues with chest discomfort and back pain in October and called on October 24th and advised [place] that she has stopped Lasix she though this was causing her symptoms. Symptoms are better since stopping she says. No current concerns.   Prior to Admission medications   Medication Sig Start Date End Date Taking? Authorizing Provider  ADVAIR DISKUS 250-50 MCG/DOSE AEPB INHALE 1 PUFFS TWICE A DAY 08/24/15   Margarita Rana, MD  aspirin 81 MG tablet Take 1 tablet by mouth daily. 07/27/08   Historical Provider, MD  azaTHIOprine (IMURAN) 50 MG tablet AZATHIOPRINE, 50MG  (Oral Tablet)  2 1/2 Every Day for 0 days  Quantity: 0.00;  Refills: 0   Ordered :20-Nov-2010  Edmonia James ;  Started 27-Jul-2008 Active Comments: DX: 358.00 07/27/08   Historical Provider, MD  azithromycin (ZITHROMAX) 250 MG tablet 2 today and then one a day for 4 more days. 08/19/15   Margarita Rana, MD  cyanocobalamin 1000 MCG tablet Take 1 tablet by mouth daily. 01/04/10   Historical Provider, MD  diltiazem (CARDIZEM CD) 180 MG 24 hr capsule TAKE 1 CAPSULE (180 MG TOTAL) BY MOUTH DAILY. 07/31/15   Historical Provider, MD  levothyroxine (SYNTHROID, LEVOTHROID) 100 MCG tablet TAKE 1 TABLET EVERY DAY 07/11/15   Margarita Rana, MD  metoCLOPramide (REGLAN) 10 MG tablet TAKE 1 TABLET BY MOUTH TWICE A DAY 05/23/15   Margarita Rana, MD  omeprazole (PRILOSEC) 20 MG capsule Take 1 capsule by mouth daily. 11/16/14   Historical Provider, MD  pyridostigmine (MESTINON) 60 MG tablet Take 1 tablet by mouth 3 (three) times daily. 07/27/08   Historical Provider, MD  simvastatin (ZOCOR) 10 MG tablet TAKE 1 TABLET AT BEDTIME 08/16/15   Margarita Rana, MD    Patient Active Problem List   Diagnosis Date Noted  . Acute bronchitis 08/19/2015  . Hyperlipemia 08/16/2015  . Shortness of breath 06/20/2015  . Myasthenia gravis ([place]) 06/06/2015  . Abnormal weight loss 06/06/2015  . Routine general medical examination at a health care facility 05/13/2015  . Body mass index of 60 or higher ([place]) 05/13/2015  . Congestive heart failure due to high blood pressure ([place]) 05/13/2015  . Acid reflux 05/13/2015  . Hemorrhoids, internal 05/13/2015  . High potassium 05/13/2015  . Candida infection of mouth 05/13/2015  . Arthritis of knee, degenerative 05/13/2015  . Absence of bladder continence 05/13/2015  . CHF (congestive heart failure) ([place]) 04/14/2015  . Atrial fibrillation ([place]) 04/14/2015  . CAFL (chronic airflow limitation) ([place]) 09/24/2014  . Essential (primary) hypertension 07/19/2014  . Obstructive apnea 02/04/2012  . 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 Awareness of heartbeats 07/11/2009  . Allergic rhinitis 03/28/2009  . Diabetes ([place]) 03/28/2009  . Megaloblastic anemia due to B12 deficiency 12/06/2008  . 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 OP (osteoporosis) 06/08/2004  .  Airway hyperreactivity 05/28/2000  . 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 Adult hypothyroidism 12/10/1994    Past Medical History  Diagnosis Date  . Hypertension   . Coronary artery disease   . COPD (chronic obstructive pulmonary disease) ([place])   . Arthritis   . Diabetes mellitus without complication ([place])   . Myasthenia gravis ([place])   . Hypothyroid   . Irregular heart beat   . CHF (congestive heart failure)  ([place]) 04/14/2015  . Atrial fibrillation ([place]) 04/14/2015  . GERD (gastroesophageal reflux disease)   . Emphysema of lung [facility])     Social History   Social History  . Marital Status: Widowed    Spouse Name: N/A  . Number of Children: 5  . Years of Education: 11th grade   Occupational History  . Retired    Social History Main Topics  . Smoking status: Never Smoker   . Smokeless tobacco: Never Used  . Alcohol Use: No  . Drug Use: No  . Sexual Activity: Not Currently   Other Topics Concern  . Not on file   Social History Narrative    Allergies  Allergen Reactions  . Ace Inhibitors Cough    Other reaction(s): Unknown  . Apixaban     Other reaction(s): Unknown  . Codeine Itching and Swelling  . Doxycycline   . Lasix [Furosemide] Other (See Comments)    Chest and back pain  . Peanut Butter Flavor     rash?  . Telithromycin     Other reaction(s): Other (See Comments) Myasthenia Gravis Patient    Review of Systems  Constitutional: Negative.   Respiratory: Positive for cough.   Cardiovascular: Negative.   Musculoskeletal: Positive for joint pain (left knee).  Neurological: Negative for dizziness.    Immunization History  Administered Date(s) Administered  . Influenza-Unspecified 09/20/2015  . Pneumococcal Conjugate-13 05/10/2014  . Pneumococcal Polysaccharide-23 09/19/2012  . Td 08/06/2008   Objective:  BP 128/60 mmHg  Pulse 48  Temp(Src) 97.8 F (36.6 C)  Resp 14  Wt 174 lb (78.926 kg)  Physical Exam  Constitutional: She is oriented to person, place, and time and well-developed, well-nourished, and in no distress.  Cardiovascular: An irregularly irregular rhythm present. Bradycardia present.   Pulmonary/Chest: Effort normal and breath sounds normal.  Neurological: She is alert and oriented to person, place, and time.  Psychiatric: Mood, memory, affect and judgment normal.    Lab Results  Component Value Date   WBC 4.6 08/29/2015   HGB 10.0*  04/15/2015   HCT 39.6 08/29/2015   PLT 231 04/15/2015   GLUCOSE 84 06/20/2015   CHOL 172 05/10/2014   TRIG 100 05/10/2014   HDL 66 05/10/2014   LDLCALC 86 05/10/2014   TSH 2.230 06/06/2015   INR 0.9 01/10/2013   HGBA1C 5.9 08/19/2015    CMP     Component Value Date/Time   NA 138 06/20/2015 1620   NA 140 04/15/2015 0424   NA 129* 03/14/2014 0401   K 5.3* 06/20/2015 1620   K 5.0 03/14/2014 1152   CL 100 06/20/2015 1620   CL 99 03/14/2014 0401   CO2 24 06/20/2015 1620   CO2 24 03/14/2014 0401   GLUCOSE 84 06/20/2015 1620   GLUCOSE 180* 04/15/2015 0424   GLUCOSE 212* 03/14/2014 0401   BUN 11 06/20/2015 1620   BUN 17 04/15/2015 0424   BUN 26* 03/14/2014 0401   CREATININE 0.65 06/20/2015 1620   CREATININE 0.7 04/25/2015   CREATININE 0.92 03/14/2014 0401  CALCIUM 9.8 06/20/2015 1620   CALCIUM 8.3* 03/14/2014 0401   PROT 6.9 06/20/2015 1620   PROT 7.3 04/14/2015 0840   PROT 6.9 03/12/2014 1904   ALBUMIN 4.0 06/20/2015 1620   ALBUMIN 3.9 04/14/2015 0840   ALBUMIN 3.5 03/12/2014 1904   AST 24 06/20/2015 1620   AST 10* 03/12/2014 1904   ALT 15 06/20/2015 1620   ALT 16 03/12/2014 1904   ALKPHOS 65 06/20/2015 1620   ALKPHOS 49 03/12/2014 1904   BILITOT 0.9 06/20/2015 1620   BILITOT 1.0 04/14/2015 0840   BILITOT 0.6 03/12/2014 1904   GFRNONAA 85 06/20/2015 1620   GFRNONAA >60 03/14/2014 0401   GFRAA 98 06/20/2015 1620   GFRAA >60 03/14/2014 0401    Assessment and Plan :  1. Decreased heart rate Suspect related to medication. Will decrease medication and recheck in 6 weeks. Also follow up then for her diabetes.  - EKG 12-Lead - diltiazem (DILACOR XR) 120 MG 24 hr capsule; Take 1 capsule (120 mg total) by mouth daily.  Dispense: 30 capsule; Refill: 5  2. Chronic atrial fibrillation (HCC) Stable. HR Too low.  Change medication as above.  - CBC with Differential/Platelet - Comprehensive metabolic panel  3. Myasthenia gravis [facility]) Will check labs and send to Dr.  Nadara Mustard.  - CBC with Differential/Platelet  4. Chronic congestive heart failure, unspecified congestive heart failure type (HCC) Stable off Lasix. Continue to monitor.    Margarita Rana, MD  10/14/2015 8:21 AM

## 2015-10-15 LAB — COMPREHENSIVE METABOLIC PANEL
ALBUMIN: 4.2 g/dL (ref 3.5–4.8)
ALK PHOS: 57 IU/L (ref 39–117)
ALT: 10 IU/L (ref 0–32)
AST: 19 IU/L (ref 0–40)
Albumin/Globulin Ratio: 1.4 (ref 1.1–2.5)
BUN/Creatinine Ratio: 18 (ref 11–26)
BUN: 12 mg/dL (ref 8–27)
Bilirubin Total: 1.1 mg/dL (ref 0.0–1.2)
CO2: 25 mmol/L (ref 18–29)
CREATININE: 0.68 mg/dL (ref 0.57–1.00)
Calcium: 10.2 mg/dL (ref 8.7–10.3)
Chloride: 99 mmol/L (ref 97–106)
GFR calc Af Amer: 97 mL/min/{1.73_m2} (ref >59)
GFR calc non Af Amer: 84 mL/min/{1.73_m2} (ref >59)
GLUCOSE: 109 mg/dL — AB (ref 65–99)
Globulin, Total: 2.9 g/dL (ref 1.5–4.5)
Potassium: 5.7 mmol/L — ABNORMAL HIGH (ref 3.5–5.2)
Sodium: 140 mmol/L (ref 136–144)
Total Protein: 7.1 g/dL (ref 6.0–8.5)

## 2015-10-15 LAB — CBC WITH DIFFERENTIAL/PLATELET
BASOS ABS: 0 10*3/uL (ref 0.0–0.2)
Basos: 1 %
EOS (ABSOLUTE): 0.1 10*3/uL (ref 0.0–0.4)
Eos: 3 %
HEMOGLOBIN: 13.8 g/dL (ref 11.1–15.9)
Hematocrit: 40.7 % (ref 34.0–46.6)
Immature Grans (Abs): 0 10*3/uL (ref 0.0–0.1)
Immature Granulocytes: 1 %
LYMPHS ABS: 1.4 10*3/uL (ref 0.7–3.1)
Lymphs: 32 %
MCH: 33.3 pg — ABNORMAL HIGH (ref 26.6–33.0)
MCHC: 33.9 g/dL (ref 31.5–35.7)
MCV: 98 fL — ABNORMAL HIGH (ref 79–97)
MONOCYTES: 17 %
Monocytes Absolute: 0.8 10*3/uL (ref 0.1–0.9)
Neutrophils Absolute: 2.1 10*3/uL (ref 1.4–7.0)
Neutrophils: 46 %
PLATELETS: 324 10*3/uL (ref 150–379)
RBC: 4.14 x10E6/uL (ref 3.77–5.28)
RDW: 14.9 % (ref 12.3–15.4)
WBC: 4.4 10*3/uL (ref 3.4–10.8)

## 2015-10-18 ENCOUNTER — Telehealth: Payer: Self-pay

## 2015-10-18 DIAGNOSIS — E875 Hyperkalemia: Secondary | ICD-10-CM

## 2015-10-18 MED ORDER — HYDROCHLOROTHIAZIDE 12.5 MG PO TABS: 12.5000 mg | ORAL_TABLET | Freq: Every day | ORAL | Status: DC

## 2015-10-18 NOTE — Telephone Encounter (Signed)
-----   Message from Margarita Rana, MD sent at 10/15/2015 10:25 AM EDT ----- Labs stable except for elevated potassium. Recommend make sure not eating bananas, any salt substitutes, sports drinks or ensure type drinks.   Also recommend start HCTZ 12.5 daily and recheck labs in 2 weeks.  Thanks.

## 2015-10-18 NOTE — Telephone Encounter (Signed)
Pt advised.  Rx sent to [facility], and lab sheet at the front desk for her to pick up.  Copy faxed to Dr. Nadara Mustard.   Thanks,   -[name]

## 2015-11-11 ENCOUNTER — Telehealth: Payer: Self-pay

## 2015-11-11 LAB — COMPREHENSIVE METABOLIC PANEL
ALT: 9 IU/L (ref 0–32)
AST: 18 IU/L (ref 0–40)
Albumin/Globulin Ratio: 1.4 (ref 1.1–2.5)
Albumin: 3.9 g/dL (ref 3.5–4.8)
Alkaline Phosphatase: 54 IU/L (ref 39–117)
BUN/Creatinine Ratio: 21 (ref 11–26)
BUN: 16 mg/dL (ref 8–27)
Bilirubin Total: 0.9 mg/dL (ref 0.0–1.2)
CALCIUM: 9.7 mg/dL (ref 8.7–10.3)
CO2: 25 mmol/L (ref 18–29)
Chloride: 100 mmol/L (ref 97–106)
Creatinine, Ser: 0.78 mg/dL (ref 0.57–1.00)
GFR, EST AFRICAN AMERICAN: 84 mL/min/{1.73_m2} (ref >59)
GFR, EST NON AFRICAN AMERICAN: 73 mL/min/{1.73_m2} (ref >59)
GLUCOSE: 165 mg/dL — AB (ref 65–99)
Globulin, Total: 2.8 g/dL (ref 1.5–4.5)
Potassium: 4.3 mmol/L (ref 3.5–5.2)
Sodium: 140 mmol/L (ref 136–144)
Total Protein: 6.7 g/dL (ref 6.0–8.5)

## 2015-11-11 NOTE — Telephone Encounter (Signed)
-----   Message from Margarita Rana, MD sent at 11/11/2015  8:07 AM EST ----- Labs normal. Please notify patient Thanks.

## 2015-11-11 NOTE — Telephone Encounter (Signed)
Pt advised.   Thanks,   -Laura  

## 2015-12-01 ENCOUNTER — Ambulatory Visit (INDEPENDENT_AMBULATORY_CARE_PROVIDER_SITE_OTHER): Payer: Medicare Other | Admitting: Family Medicine

## 2015-12-01 ENCOUNTER — Encounter: Payer: Self-pay | Admitting: Family Medicine

## 2015-12-01 VITALS — BP 114/62 | HR 56 | Temp 98.2°F | Resp 20 | Ht 60.0 in | Wt 181.0 lb

## 2015-12-01 DIAGNOSIS — E119 Type 2 diabetes mellitus without complications: Secondary | ICD-10-CM | POA: Insufficient documentation

## 2015-12-01 DIAGNOSIS — R001 Bradycardia, unspecified: Secondary | ICD-10-CM | POA: Diagnosis not present

## 2015-12-01 DIAGNOSIS — J441 Chronic obstructive pulmonary disease with (acute) exacerbation: Secondary | ICD-10-CM

## 2015-12-01 LAB — POCT GLYCOSYLATED HEMOGLOBIN (HGB A1C)
ESTIMATED AVERAGE GLUCOSE: 146
HEMOGLOBIN A1C: 6.7

## 2015-12-01 MED ORDER — TIOTROPIUM BROMIDE MONOHYDRATE 18 MCG IN CAPS: 18.0000 ug | ORAL_CAPSULE | Freq: Every day | RESPIRATORY_TRACT | Status: DC

## 2015-12-01 MED ORDER — PREDNISONE 10 MG PO TABS: ORAL_TABLET | ORAL | Status: DC

## 2015-12-01 NOTE — Progress Notes (Signed)
Patient ID: Julia Crawford, female   DOB: May 25, 1935, 80 y.o.   MRN: [id number]        [name] Female    DOB: 1935/03/17   80 y.o.   MRN: [id number] Visit Date: 12/01/2015  Today's Provider: Margarita Rana, MD   Chief Complaint  Patient presents with  . Diabetes  . Follow-up   Subjective:    HPI   Follow up for decreased heart rate  The patient was last seen for this 6 weeks ago. Changes made at last visit include decrease diltiazem to 120 mg .  She reports excellent compliance with treatment. She feels that condition is Unchanged. She is not having side effects.   ------------------------------------------------------------------------------------  Cough: Patient complains of nonproductive cough.  Symptoms began a few weeks ago.  The cough is non-productive, is aggravated by nothing Associated symptoms include: wheezing. Patient does have a history of asthma. Patient does have a history of environmental allergens. Patient has no recent travel. Patient does not have a history of smoking. Patient  has previous Chest X-ray. Patient reports she stopped using her Advair about a month ago. Patient reports that medication was causing her to cough more.     Diabetes Mellitus Type II, Follow-up:   Lab Results  Component Value Date   HGBA1C 6.7 12/01/2015   HGBA1C 5.9 08/19/2015   HGBA1C 6.4* 04/25/2015    Last seen for diabetes 3 months ago.  Management since then includes none. Current symptoms include none and have been stable. Home blood sugar records: not checking  Episodes of hypoglycemia? no   Current Insulin Regimen: none Most Recent Eye Exam:  Weight trend: increasing steadily Prior visit with dietician: no Current diet: in general, a "healthy" diet   Current exercise: none  Pertinent Labs:    Component Value Date/Time   CHOL 172 05/10/2014   CHOL 158 06/23/2013 0525   TRIG 100 05/10/2014   TRIG 51 06/23/2013 0525   HDL 66 05/10/2014   HDL 81* 06/23/2013 0525   LDLCALC 86 05/10/2014   LDLCALC 67 06/23/2013 0525   CREATININE 0.78 11/10/2015 0950   CREATININE 0.7 04/25/2015   CREATININE 0.92 03/14/2014 0401    Wt Readings from Last 3 Encounters:  12/01/15 181 lb (82.101 kg)  10/14/15 174 lb (78.926 kg)  06/20/15 181 lb (82.101 kg)    ------------------------------------------------------------------------      Allergies  Allergen Reactions  . Ace Inhibitors Cough    Other reaction(s): Unknown  . Apixaban     Other reaction(s): Unknown  . Codeine Itching and Swelling  . Doxycycline   . Lasix [Furosemide] Other (See Comments)    Chest and back pain  . Peanut Butter Flavor     rash?  . Telithromycin     Other reaction(s): Other (See Comments) Myasthenia Gravis Patient   Previous Medications   ADVAIR DISKUS 250-50 MCG/DOSE AEPB    INHALE 1 PUFFS TWICE A DAY   ASPIRIN 81 MG TABLET    Take 1 tablet by mouth daily.   AZATHIOPRINE (IMURAN) 50 MG TABLET    AZATHIOPRINE, 50MG  (Oral Tablet)  2 1/2 Every Day for 0 days  Quantity: 0.00;  Refills: 0   Ordered :20-Nov-2010  Edmonia James ;  Started 27-Jul-2008 Active Comments: DX: 358.00   CYANOCOBALAMIN 1000 MCG TABLET    Take 1 tablet by mouth daily.   DILTIAZEM (DILACOR XR) 120 MG 24 HR CAPSULE    Take 1 capsule (120 mg total) by mouth daily.  HYDROCHLOROTHIAZIDE (HYDRODIURIL) 12.5 MG TABLET    Take 1 tablet (12.5 mg total) by mouth daily.   LEVOTHYROXINE (SYNTHROID, LEVOTHROID) 100 MCG TABLET    TAKE 1 TABLET EVERY DAY   METOCLOPRAMIDE (REGLAN) 10 MG TABLET    TAKE 1 TABLET BY MOUTH TWICE A DAY   PYRIDOSTIGMINE (MESTINON) 60 MG TABLET    Take 1 tablet by mouth 3 (three) times daily.   SIMVASTATIN (ZOCOR) 10 MG TABLET    TAKE 1 TABLET AT BEDTIME    Review of Systems  Constitutional: Negative.   HENT: Positive for congestion.   Respiratory: Positive for cough, shortness of breath and wheezing.   Cardiovascular: Negative.   Endocrine: Negative.      Social History  Substance Use Topics  . Smoking status: Never Smoker   . Smokeless tobacco: Never Used  . Alcohol Use: No   Objective:   BP 114/62 mmHg  Pulse 56  Temp(Src) 98.2 F (36.8 C) (Oral)  Resp 20  Ht 5' (1.524 m)  Wt 181 lb (82.101 kg)  BMI 35.35 kg/m2  SpO2 94%  Physical Exam  Constitutional: She appears well-developed and well-nourished.  Cardiovascular: Normal rate and regular rhythm.   Pulmonary/Chest: She has wheezes.  Musculoskeletal:       Left knee: Tenderness found.        Assessment & Plan:     1. Decreased heart rate Improving. Patient advised to continue current medication and plan of care.  2. Diabetes mellitus without complication (HCC) Stable.  - POCT HgB A1C  3. Chronic obstructive pulmonary disease with acute exacerbation (HCC) Worsening. Patient started on spiriva handihaler 18 mcg as below. Patient advised to call if symptoms are not improving or worsening. Follow-up in a month. - tiotropium (SPIRIVA HANDIHALER) 18 MCG inhalation capsule; Place 1 capsule (18 mcg total) into inhaler and inhale daily.  Dispense: 30 capsule; Refill: 5 - CT Chest W Contrast; Future     Patient seen and examined by Dr. Jerrell Belfast, and note scribed by Philbert Riser. Dimas, CMA.  I have reviewed the document for accuracy and completeness and I agree with above. - Jerrell Belfast, MD   Margarita Rana, MD  [facility]

## 2015-12-15 ENCOUNTER — Ambulatory Visit
Admission: RE | Admit: 2015-12-15 | Discharge: 2015-12-15 | Disposition: A | Discharge status: Home / Self Care | Payer: Medicare Other | Source: Ambulatory Visit | Attending: Family Medicine | Admitting: Family Medicine

## 2015-12-15 DIAGNOSIS — I2510 Atherosclerotic heart disease of native coronary artery without angina pectoris: Secondary | ICD-10-CM | POA: Insufficient documentation

## 2015-12-15 DIAGNOSIS — J441 Chronic obstructive pulmonary disease with (acute) exacerbation: Secondary | ICD-10-CM

## 2015-12-15 DIAGNOSIS — R918 Other nonspecific abnormal finding of lung field: Secondary | ICD-10-CM | POA: Insufficient documentation

## 2015-12-15 DIAGNOSIS — I517 Cardiomegaly: Secondary | ICD-10-CM | POA: Diagnosis not present

## 2015-12-15 DIAGNOSIS — K8020 Calculus of gallbladder without cholecystitis without obstruction: Secondary | ICD-10-CM | POA: Insufficient documentation

## 2015-12-15 DIAGNOSIS — R05 Cough: Secondary | ICD-10-CM | POA: Insufficient documentation

## 2015-12-15 HISTORY — DX: Unspecified asthma, uncomplicated: J45.909

## 2015-12-15 MED ORDER — IOHEXOL 300 MG/ML  SOLN
75.0000 mL | Freq: Once | INTRAMUSCULAR | Status: AC | PRN
  Administered 2015-12-15: 75 mL via INTRAVENOUS

## 2015-12-16 ENCOUNTER — Telehealth: Payer: Self-pay

## 2015-12-16 NOTE — Telephone Encounter (Signed)
-----   Message from Margarita Rana, MD sent at 12/16/2015 12:47 PM EST ----- Small nodule in chest.  Repeat scan in one year. Also gallstone, please see if patient is having any RUQ pain. Thanks.

## 2015-12-16 NOTE — Telephone Encounter (Signed)
Advised patient of results. Patient denies any RUQ pain. She does mention that she could not tolerate Spiriva. She says that it made her "chest ache". She reports that she cant tolerate any inhalers because it makes her chest throb. Patient wanted me to let you know this.

## 2015-12-26 ENCOUNTER — Encounter: Payer: Self-pay | Admitting: Family Medicine

## 2015-12-26 ENCOUNTER — Ambulatory Visit (INDEPENDENT_AMBULATORY_CARE_PROVIDER_SITE_OTHER): Payer: Medicare Other | Admitting: Family Medicine

## 2015-12-26 VITALS — BP 140/80 | HR 72 | Temp 98.2°F | Resp 20 | Wt 193.0 lb

## 2015-12-26 DIAGNOSIS — E875 Hyperkalemia: Secondary | ICD-10-CM | POA: Diagnosis not present

## 2015-12-26 DIAGNOSIS — J441 Chronic obstructive pulmonary disease with (acute) exacerbation: Secondary | ICD-10-CM

## 2015-12-26 MED ORDER — PREDNISONE 10 MG PO TABS: ORAL_TABLET | ORAL | Status: DC

## 2015-12-26 MED ORDER — AZITHROMYCIN 250 MG PO TABS: ORAL_TABLET | ORAL | Status: DC

## 2015-12-26 NOTE — Patient Instructions (Signed)
Please take Spiriva inhaler daily.

## 2015-12-26 NOTE — Progress Notes (Signed)
Patient ID: Julia Crawford, female   DOB: 11-18-1935, 81 y.o.   MRN: [id number]       [name] Female    DOB: 04-12-1935   81 y.o.   MRN: [id number] Visit Date: 12/26/2015  Today's Provider: Margarita Rana, MD   Chief Complaint  Patient presents with  . COPD  . Hypertension   Subjective:    HPI  COPD, Follow up:  She was last seen for this 4 weeks ago. Changes made include started Spiriva.   She reports poor compliance with treatment. She is having side effects. Patient reports chest and back ache. she is/isnot using rescue inhaler. Typically 1 per days. She IS experiencing wheezing. She is NOT experiencing fever. she report breathing is Unchanged. Patient reports she started wheezing on Saturday has been used Spiriva once a day since then. Patient reports mild improvement with spiriva.   ------------------------------------------------------------------------   Hypertension, follow-up:  BP Readings from Last 3 Encounters:  12/26/15 140/80  12/01/15 114/62  10/14/15 128/60    She was last seen for hypertension 4 weeks ago.  BP at that visit was 114/62. Management changes since that visit include none. She reports fair compliance with treatment. She is having side effects. Patient reports she is having frequency and urgency. Patient reports she lives on the 3rd floor and can not get to the bathroom when needed. Patient reports she stopped taking HCTZ, about a week ago. She is not exercising. She is adherent to low salt diet.   Outside blood pressures are stable. She is experiencing none.  Patient denies chest pain.   Cardiovascular risk factors include advanced age (older than 13 for men, 67 for women).  Use of agents associated with hypertension: none.     Weight trend: stable Wt Readings from Last 3 Encounters:  12/26/15 193 lb (87.544 kg)  12/01/15 181 lb (82.101 kg)  10/14/15 174 lb (78.926 kg)    Current diet: in general, a "healthy" diet      ------------------------------------------------------------------------        Allergies  Allergen Reactions  . Ace Inhibitors Cough    Other reaction(s): Unknown  . Apixaban     Other reaction(s): Unknown  . Codeine Itching and Swelling  . Doxycycline   . Lasix [Furosemide] Other (See Comments)    Chest and back pain  . Peanut Butter Flavor     rash?  . Telithromycin     Other reaction(s): Other (See Comments) Myasthenia Gravis Patient   Previous Medications   ASPIRIN 81 MG TABLET    Take 1 tablet by mouth daily.   AZATHIOPRINE (IMURAN) 50 MG TABLET    AZATHIOPRINE, 50MG  (Oral Tablet)  2 1/2 Every Day for 0 days  Quantity: 0.00;  Refills: 0   Ordered :20-Nov-2010  Edmonia James ;  Started 27-Jul-2008 Active Comments: DX: 358.00   CYANOCOBALAMIN 1000 MCG TABLET    Take 1 tablet by mouth daily.   DILTIAZEM (DILACOR XR) 120 MG 24 HR CAPSULE    Take 1 capsule (120 mg total) by mouth daily.   HYDROCHLOROTHIAZIDE (HYDRODIURIL) 12.5 MG TABLET    Take 1 tablet (12.5 mg total) by mouth daily.   LEVOTHYROXINE (SYNTHROID, LEVOTHROID) 100 MCG TABLET    TAKE 1 TABLET EVERY DAY   METOCLOPRAMIDE (REGLAN) 10 MG TABLET    TAKE 1 TABLET BY MOUTH TWICE A DAY   PREDNISONE (DELTASONE) 10 MG TABLET    6 po for 2 days and then 5 po for 2  days and then 4 po for 2 days and 3 po for 2 days and then 2 po for 2 days and then 1 po for 2 days.   PYRIDOSTIGMINE (MESTINON) 60 MG TABLET    Take 1 tablet by mouth 3 (three) times daily.   SIMVASTATIN (ZOCOR) 10 MG TABLET    TAKE 1 TABLET AT BEDTIME   TIOTROPIUM (SPIRIVA HANDIHALER) 18 MCG INHALATION CAPSULE    Place 1 capsule (18 mcg total) into inhaler and inhale daily.    Review of Systems  Social History  Substance Use Topics  . Smoking status: Never Smoker   . Smokeless tobacco: Never Used  . Alcohol Use: No   Objective:   BP 140/80 mmHg  Pulse 72  Temp(Src) 98.2 F (36.8 C) (Oral)  Resp 20  Wt 193 lb (87.544 kg)  SpO2  94%  Physical Exam  Constitutional: She is oriented to person, place, and time. She appears well-developed and well-nourished.  Cardiovascular: Normal rate and regular rhythm.   Pulmonary/Chest: She has wheezes.  Prolonged respiratory breath  Neurological: She is alert and oriented to person, place, and time.  Psychiatric: She has a normal mood and affect. Her behavior is normal.      Assessment & Plan:     1. Chronic obstructive pulmonary disease with acute exacerbation (HCC) Worsening. Patient advised to use Spiriva inhaler daily. She states she has pain with deep inspiration. Suspect related to inhalation, not medication itself. Encouraged her to use regularly.    2. COPD with acute exacerbation (HCC) Worsening. Patient started on prednisone 10 mg and azithromycin 250 mg as below. - predniSONE (DELTASONE) 10 MG tablet; 6 po for 2 days and then 5 po for 2 days and then 4 po for 2 days and 3 po for 2 days and then 2 po for 2 days and then 1 po for 2 days.  Dispense: 42 tablet; Refill: 0 - azithromycin (ZITHROMAX) 250 MG tablet; Take 2 tablets on day one and 1 tablet daily for 4 days.  Dispense: 6 tablet; Refill: 0  3. High potassium Patient off HCTZ. F/U pending lab report. - Comprehensive metabolic panel     Patient seen and examined by Dr. Jerrell Belfast, and note scribed by Philbert Riser. Dimas, CMA.  I have reviewed the document for accuracy and completeness and I agree with above. - Jerrell Belfast, MD    Margarita Rana, MD  [facility]

## 2015-12-27 ENCOUNTER — Telehealth: Payer: Self-pay

## 2015-12-27 LAB — COMPREHENSIVE METABOLIC PANEL
A/G RATIO: 1.5 (ref 1.1–2.5)
ALT: 11 IU/L (ref 0–32)
AST: 13 IU/L (ref 0–40)
Albumin: 4 g/dL (ref 3.5–4.8)
Alkaline Phosphatase: 54 IU/L (ref 39–117)
BILIRUBIN TOTAL: 0.9 mg/dL (ref 0.0–1.2)
BUN/Creatinine Ratio: 19 (ref 11–26)
BUN: 12 mg/dL (ref 8–27)
CALCIUM: 9.2 mg/dL (ref 8.7–10.3)
CHLORIDE: 101 mmol/L (ref 96–106)
CO2: 25 mmol/L (ref 18–29)
Creatinine, Ser: 0.63 mg/dL (ref 0.57–1.00)
GFR, EST AFRICAN AMERICAN: 99 mL/min/{1.73_m2} (ref >59)
GFR, EST NON AFRICAN AMERICAN: 86 mL/min/{1.73_m2} (ref >59)
GLOBULIN, TOTAL: 2.7 g/dL (ref 1.5–4.5)
Glucose: 159 mg/dL — ABNORMAL HIGH (ref 65–99)
POTASSIUM: 4.5 mmol/L (ref 3.5–5.2)
SODIUM: 141 mmol/L (ref 134–144)
TOTAL PROTEIN: 6.7 g/dL (ref 6.0–8.5)

## 2015-12-27 NOTE — Telephone Encounter (Signed)
Tried calling; no answer 12/27/2015  Thanks,   -[name]

## 2015-12-27 NOTE — Telephone Encounter (Signed)
-----   Message from Margarita Rana, MD sent at 12/27/2015  6:34 AM EST ----- Labs look good, including potassium. Ok to remain off HCTZ. Thanks.

## 2015-12-28 NOTE — Telephone Encounter (Signed)
Pt is returning call.  [phone]

## 2015-12-29 NOTE — Telephone Encounter (Signed)
Pt advised. Labs faxed to Dr. Nadara Mustard.   Thanks,   -[name]

## 2015-12-30 ENCOUNTER — Telehealth: Payer: Self-pay | Admitting: Family Medicine

## 2015-12-30 DIAGNOSIS — I509 Heart failure, unspecified: Secondary | ICD-10-CM

## 2015-12-30 MED ORDER — FUROSEMIDE 20 MG PO TABS
20.0000 mg | ORAL_TABLET | Freq: Every day | ORAL | Status: DC
Start: 2015-12-30 — End: 2016-11-12

## 2015-12-30 NOTE — Telephone Encounter (Signed)
Ok to restart Lasix. Please add to computer and put in new rx.  Recheck met c in 2 weeks. Thanks.

## 2015-12-30 NOTE — Telephone Encounter (Signed)
Pt advised.  She is going to call in about two weeks to get a lab sheet.   Thanks,   -[name]

## 2015-12-30 NOTE — Telephone Encounter (Signed)
Pt stated that at her OV on 12/26/15 she advised that she had stopped taking furosemide (LASIX) 20 MG tablet about a month ago. Pt stated that her legs have started swelling but she doesn't think she has fluid in her chest just her legs. Pt would like to know if she should start back on the furosemide (LASIX) 20 MG tablet. Please advise. Thanks TNP

## 2016-01-02 ENCOUNTER — Telehealth: Payer: Self-pay | Admitting: Family Medicine

## 2016-01-02 NOTE — Telephone Encounter (Signed)
Pt advised as directed below.   Thanks,   -Braxston Quinter  

## 2016-01-02 NOTE — Telephone Encounter (Signed)
If swelling is better she can cut back to every other day

## 2016-01-02 NOTE — Telephone Encounter (Signed)
Pt called in saying the fluid pills are making her go to the bathroom too much.    Please advise [phone]  Thanks Con Memos

## 2016-01-23 ENCOUNTER — Telehealth: Payer: Self-pay

## 2016-01-23 DIAGNOSIS — G7000 Myasthenia gravis without (acute) exacerbation: Secondary | ICD-10-CM

## 2016-01-23 NOTE — Telephone Encounter (Signed)
Lab order for CBC and Met C  Thanks,   -[name]

## 2016-01-24 ENCOUNTER — Telehealth: Payer: Self-pay

## 2016-01-24 LAB — CBC WITH DIFFERENTIAL/PLATELET
BASOS ABS: 0 10*3/uL (ref 0.0–0.2)
BASOS: 1 %
EOS (ABSOLUTE): 0.3 10*3/uL (ref 0.0–0.4)
Eos: 7 %
Hematocrit: 43.4 % (ref 34.0–46.6)
Hemoglobin: 14.8 g/dL (ref 11.1–15.9)
IMMATURE GRANS (ABS): 0 10*3/uL (ref 0.0–0.1)
Immature Granulocytes: 1 %
LYMPHS ABS: 1.5 10*3/uL (ref 0.7–3.1)
LYMPHS: 36 %
MCH: 32.6 pg (ref 26.6–33.0)
MCHC: 34.1 g/dL (ref 31.5–35.7)
MCV: 96 fL (ref 79–97)
MONOS ABS: 0.8 10*3/uL (ref 0.1–0.9)
Monocytes: 19 %
NEUTROS ABS: 1.5 10*3/uL (ref 1.4–7.0)
Neutrophils: 36 %
PLATELETS: 233 10*3/uL (ref 150–379)
RBC: 4.54 x10E6/uL (ref 3.77–5.28)
RDW: 14.5 % (ref 12.3–15.4)
WBC: 4.1 10*3/uL (ref 3.4–10.8)

## 2016-01-24 LAB — COMPREHENSIVE METABOLIC PANEL
A/G RATIO: 1.3 (ref 1.1–2.5)
ALK PHOS: 69 IU/L (ref 39–117)
ALT: 11 IU/L (ref 0–32)
AST: 20 IU/L (ref 0–40)
Albumin: 3.8 g/dL (ref 3.5–4.8)
BILIRUBIN TOTAL: 1.1 mg/dL (ref 0.0–1.2)
BUN/Creatinine Ratio: 16 (ref 11–26)
BUN: 12 mg/dL (ref 8–27)
CHLORIDE: 101 mmol/L (ref 96–106)
CO2: 23 mmol/L (ref 18–29)
Calcium: 9.7 mg/dL (ref 8.7–10.3)
Creatinine, Ser: 0.74 mg/dL (ref 0.57–1.00)
GFR calc non Af Amer: 77 mL/min/{1.73_m2} (ref >59)
GFR, EST AFRICAN AMERICAN: 89 mL/min/{1.73_m2} (ref >59)
GLUCOSE: 256 mg/dL — AB (ref 65–99)
Globulin, Total: 2.9 g/dL (ref 1.5–4.5)
POTASSIUM: 4 mmol/L (ref 3.5–5.2)
Sodium: 139 mmol/L (ref 134–144)
TOTAL PROTEIN: 6.7 g/dL (ref 6.0–8.5)

## 2016-01-24 NOTE — Telephone Encounter (Signed)
-----   Message from Margarita Rana, MD sent at 01/24/2016  7:00 AM EST ----- Labs stable except for elevated blood sugar.  Please notify patient. Thanks.

## 2016-01-24 NOTE — Telephone Encounter (Signed)
Tried calling; no answer.  01/24/2016  Thanks,   -[name]

## 2016-01-26 NOTE — Telephone Encounter (Signed)
Pt advised as below

## 2016-02-22 ENCOUNTER — Ambulatory Visit (INDEPENDENT_AMBULATORY_CARE_PROVIDER_SITE_OTHER): Payer: Medicare Other | Admitting: Family Medicine

## 2016-02-22 ENCOUNTER — Encounter: Payer: Self-pay | Admitting: Family Medicine

## 2016-02-22 VITALS — BP 140/68 | HR 64 | Temp 97.9°F | Resp 24 | Wt 197.0 lb

## 2016-02-22 DIAGNOSIS — J449 Chronic obstructive pulmonary disease, unspecified: Secondary | ICD-10-CM

## 2016-02-22 DIAGNOSIS — E785 Hyperlipidemia, unspecified: Secondary | ICD-10-CM | POA: Diagnosis not present

## 2016-02-22 DIAGNOSIS — E119 Type 2 diabetes mellitus without complications: Secondary | ICD-10-CM

## 2016-02-22 LAB — POCT URINALYSIS DIPSTICK
BILIRUBIN UA: NEGATIVE
Glucose, UA: NEGATIVE
KETONES UA: NEGATIVE
Leukocytes, UA: NEGATIVE
Nitrite, UA: NEGATIVE
PH UA: 5
SPEC GRAV UA: 1.025
Urobilinogen, UA: 0.2

## 2016-02-22 LAB — GLUCOSE, POCT (MANUAL RESULT ENTRY): POC GLUCOSE: 142 mg/dL — AB (ref 70–99)

## 2016-02-22 NOTE — Progress Notes (Signed)
Subjective:    Patient ID: Julia Crawford, female    DOB: 08-Oct-1935, 81 y.o.   MRN: [id number]   COPD: Patient complains of cough and dyspnea on exertion. Symptoms began several years ago. Symptoms non-productive cough and wheezing do worsen with exertion. Patient currently is not on home oxygen therapy. Pt is taking Spiriva BID.   Hyperlipidemia This is a chronic problem. The problem is controlled. Recent lipid tests were reviewed and are normal (Last checked 05/10/2014. Total- 100, Trig- 172, HDL- 66, LDL- 86). Pertinent negatives include no chest pain or shortness of breath. Current antihyperlipidemic treatment includes statins (Simvastatin 10 mg). Risk factors for coronary artery disease include diabetes mellitus and dyslipidemia.      Review of Systems  Constitutional: Positive for diaphoresis. Negative for fever, chills, activity change, appetite change and fatigue.  HENT: Positive for ear pain (right ear with URI sx).   Respiratory: Positive for cough. Negative for chest tightness, shortness of breath and wheezing.   Cardiovascular: Negative for chest pain, palpitations and leg swelling.   BP 140/68 mmHg  Pulse 64  Temp(Src) 97.9 F (36.6 C) (Oral)  Resp 24  Wt 197 lb (89.359 kg)  SpO2 95%   Patient Active Problem List   Diagnosis Date Noted  . Diabetes mellitus without complication ([place]) XX123456  . COPD exacerbation ([place]) 12/01/2015  . Acute bronchitis 08/19/2015  . Hyperlipemia 08/16/2015  . Shortness of breath 06/20/2015  . Myasthenia gravis ([place]) 06/06/2015  . Abnormal weight loss 06/06/2015  . Routine general medical examination at a health care facility 05/13/2015  . Body mass index of 60 or higher ([place]) 05/13/2015  . Congestive heart failure due to high blood pressure ([place]) 05/13/2015  . Acid reflux 05/13/2015  . Hemorrhoids, internal 05/13/2015  . High potassium 05/13/2015  . Candida infection of mouth 05/13/2015  . Arthritis of knee, degenerative  05/13/2015  . Absence of bladder continence 05/13/2015  . CHF (congestive heart failure) ([place]) 04/14/2015  . Atrial fibrillation ([place]) 04/14/2015  . CAFL (chronic airflow limitation) ([place]) 09/24/2014  . Chronic obstructive pulmonary disease ([place]) 09/24/2014  . Essential (primary) hypertension 07/19/2014  . Obstructive apnea 02/04/2012  . 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 Awareness of heartbeats 07/11/2009  . Allergic rhinitis 03/28/2009  . Megaloblastic anemia due to B12 deficiency 12/06/2008  . 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 OP (osteoporosis) 06/08/2004  . Airway hyperreactivity 05/28/2000  . 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 Adult hypothyroidism 12/10/1994   Past Medical History  Diagnosis Date  . Coronary artery disease   . COPD (chronic obstructive pulmonary disease) ([place])   . Arthritis   . Diabetes mellitus without complication ([place])   . Myasthenia gravis ([place])   . Hypothyroid   . Irregular heart beat   . Atrial fibrillation ([place]) 04/14/2015  . GERD (gastroesophageal reflux disease)   . Emphysema of lung ([place])   . Asthma    Current Outpatient Prescriptions on File Prior to Visit  Medication Sig  . aspirin 81 MG tablet Take 1 tablet by mouth daily.  Marland Kitchen azaTHIOprine (IMURAN) 50 MG tablet AZATHIOPRINE, 50MG  (Oral Tablet)  2 1/2 Every Day for 0 days  Quantity: 0.00;  Refills: 0   Ordered :20-Nov-2010  Edmonia James ;  Started 27-Jul-2008 Active Comments: DX: 358.00  . cyanocobalamin 1000 MCG tablet Take 1 tablet by mouth daily.  Marland Kitchen diltiazem (DILACOR XR) 120 MG 24 hr capsule Take 1 capsule (120 mg total) by mouth daily.  Marland Kitchen  furosemide (LASIX) 20 MG tablet Take 1 tablet (20 mg total) by mouth daily. (Patient taking differently: Take 20 mg by mouth every other day. )  . levothyroxine (SYNTHROID, LEVOTHROID) 100 MCG tablet TAKE 1 TABLET EVERY DAY  . metoCLOPramide (REGLAN) 10 MG tablet TAKE 1 TABLET BY MOUTH TWICE A DAY  .  pyridostigmine (MESTINON) 60 MG tablet Take 1 tablet by mouth 3 (three) times daily.  . simvastatin (ZOCOR) 10 MG tablet TAKE 1 TABLET AT BEDTIME  . tiotropium (SPIRIVA HANDIHALER) 18 MCG inhalation capsule Place 1 capsule (18 mcg total) into inhaler and inhale daily.   No current facility-administered medications on file prior to visit.   Allergies  Allergen Reactions  . Ace Inhibitors Cough    Other reaction(s): Unknown  . Apixaban     Other reaction(s): Unknown  . Azithromycin     Myasthenia Gravis Patient  . Codeine Itching and Swelling  . Doxycycline   . Lasix [Furosemide] Other (See Comments)    Chest and back pain  . Peanut Butter Flavor     rash?  . Telithromycin     Other reaction(s): Other (See Comments) Myasthenia Gravis Patient   Past Surgical History  Procedure Laterality Date  . Tonsillectomy    . Partial hysterectomy    . Cataract extraction    . Breast biopsy     Social History   Social History  . Marital Status: Widowed    Spouse Name: N/A  . Number of Children: 5  . Years of Education: 11th grade   Occupational History  . Retired    Social History Main Topics  . Smoking status: Never Smoker   . Smokeless tobacco: Never Used  . Alcohol Use: No  . Drug Use: No  . Sexual Activity: Not Currently   Other Topics Concern  . Not on file   Social History Narrative   Family History  Problem Relation Age of Onset  . CAD Mother   . Diabetes Father   . CAD Brother   . Diabetes Brother        Objective:   Physical Exam  Constitutional: She is oriented to person, place, and time. She appears well-developed and well-nourished.  HENT:  Right Ear: Tympanic membrane and ear canal normal.  Left Ear: Tympanic membrane, external ear and ear canal normal.  Cerumen in right ear. Normal after flushing.   Pulmonary/Chest: Effort normal and breath sounds normal.  Neurological: She is alert and oriented to person, place, and time.  Psychiatric: She has a  normal mood and affect. Her behavior is normal.   BP 140/68 mmHg  Pulse 64  Temp(Src) 97.9 F (36.6 C) (Oral)  Resp 24  Wt 197 lb (89.359 kg)  SpO2 95%     Assessment & Plan:  1. Diabetes mellitus without complication ([place]) Stable today. Recheck Hgb A1c next month. No need to change treatment at this time. Urine clear. Will send for culture. Consider  Ditropan if culture negative.  - POCT glucose (manual entry) - POCT urinalysis dipstick - Urine Culture - Urinalysis, Routine w reflex microscopic Results for orders placed or performed in visit on 02/22/16  POCT glucose (manual entry)  Result Value Ref Range   POC Glucose 142 (A) 70 - 99 mg/dl  POCT urinalysis dipstick  Result Value Ref Range   Color, UA     Clarity, UA     Glucose, UA Negative    Bilirubin, UA Negative    Ketones, UA Negative  Spec Grav, UA 1.025    Blood, UA Moderate    pH, UA 5.0    Protein, UA Trace    Urobilinogen, UA 0.2    Nitrite, UA Negative    Leukocytes, UA Negative Negative    2. Chronic obstructive pulmonary disease, unspecified COPD type ([place]) Stable. Continue Spiriva. Decrease to one time a day as instructed. Discuss SOB with cardiologist. May be related to her weight gain, deconditioning and CHF.   3. Hyperlipemia Condition is stable. Please continue current medication and  plan of care as noted.    Margarita Rana, MD

## 2016-02-24 LAB — URINALYSIS, ROUTINE W REFLEX MICROSCOPIC
BILIRUBIN UA: NEGATIVE
GLUCOSE, UA: NEGATIVE
KETONES UA: NEGATIVE
Leukocytes, UA: NEGATIVE
Nitrite, UA: NEGATIVE
PH UA: 6 (ref 5.0–7.5)
Specific Gravity, UA: 1.024 (ref 1.005–1.030)
UUROB: 0.2 mg/dL (ref 0.2–1.0)

## 2016-02-24 LAB — MICROSCOPIC EXAMINATION: Casts: NONE SEEN /lpf

## 2016-02-24 LAB — URINE CULTURE

## 2016-02-27 ENCOUNTER — Telehealth: Payer: Self-pay

## 2016-02-27 DIAGNOSIS — R350 Frequency of micturition: Secondary | ICD-10-CM

## 2016-02-27 MED ORDER — OXYBUTYNIN CHLORIDE 5 MG PO TABS: 5.0000 mg | ORAL_TABLET | Freq: Two times a day (BID) | ORAL | Status: DC

## 2016-02-27 NOTE — Telephone Encounter (Signed)
Patient reports that she will try the Oxybutynin to see if that helps. She uses [facility] in [place]. Ok to send? If so, how many refills? Thanks!

## 2016-02-27 NOTE — Telephone Encounter (Signed)
Medication sent into patient's pharmacy  

## 2016-02-27 NOTE — Telephone Encounter (Signed)
-----   Message from Margarita Rana, MD sent at 02/25/2016  1:43 PM EDT ----- No infection. Please see if patient wants to try Oxybutynin 5 mg bid for urine frequency. Thanks.

## 2016-02-27 NOTE — Telephone Encounter (Signed)
Patient was advised.  

## 2016-02-27 NOTE — Telephone Encounter (Signed)
Oxybutynin 5 mg bid. Thanks.

## 2016-03-20 ENCOUNTER — Telehealth: Payer: Self-pay | Admitting: Family Medicine

## 2016-03-20 NOTE — Telephone Encounter (Signed)
Pt call saying she heard about a recall on the spriva and wants to know if she should keep using it or not.  Her call back is [phone]  Thanks teri

## 2016-03-20 NOTE — Telephone Encounter (Signed)
Keep using it unless hears from pharmacy.  Thanks- Dr. Jerilynn Mages

## 2016-03-20 NOTE — Telephone Encounter (Signed)
Pt informed and voiced understanding of results. 

## 2016-03-20 NOTE — Telephone Encounter (Signed)
Please review-aa 

## 2016-03-23 ENCOUNTER — Encounter: Payer: Self-pay | Admitting: Family Medicine

## 2016-03-23 ENCOUNTER — Ambulatory Visit (INDEPENDENT_AMBULATORY_CARE_PROVIDER_SITE_OTHER): Payer: Medicare Other | Admitting: Family Medicine

## 2016-03-23 VITALS — BP 122/76 | HR 60 | Temp 98.3°F | Resp 16 | Wt 195.0 lb

## 2016-03-23 DIAGNOSIS — E119 Type 2 diabetes mellitus without complications: Secondary | ICD-10-CM

## 2016-03-23 DIAGNOSIS — I10 Essential (primary) hypertension: Secondary | ICD-10-CM

## 2016-03-23 DIAGNOSIS — J449 Chronic obstructive pulmonary disease, unspecified: Secondary | ICD-10-CM | POA: Diagnosis not present

## 2016-03-23 DIAGNOSIS — E785 Hyperlipidemia, unspecified: Secondary | ICD-10-CM

## 2016-03-23 LAB — POCT GLYCOSYLATED HEMOGLOBIN (HGB A1C): Hemoglobin A1C: 8.8

## 2016-03-23 MED ORDER — METFORMIN HCL 500 MG PO TABS: 500.0000 mg | ORAL_TABLET | Freq: Two times a day (BID) | ORAL | Status: DC

## 2016-03-23 NOTE — Progress Notes (Signed)
Patient ID: Julia Crawford, female   DOB: September 03, 1935, 81 y.o.   MRN: [id number]         [name] Female    DOB: 10/25/1935   81 y.o.   MRN: [id number] Visit Date: 03/23/2016  Today's Provider: Margarita Rana, MD   Chief Complaint  Patient presents with  . Hypertension  . Hyperlipidemia  . Diabetes   Subjective:    HPI    Diabetes Mellitus Type II, Follow-up:   Lab Results  Component Value Date   HGBA1C 6.7 12/01/2015   HGBA1C 5.9 08/19/2015   HGBA1C 6.4* 04/25/2015   Last seen for diabetes 1 months ago.  Management since then includes None. She reports excellent compliance with treatment. She is not having side effects.  Current symptoms include none and have been stable. Home blood sugar records: Pt is not checking her blood sugar.   Episodes of hypoglycemia? no   Most Recent Eye Exam: Over two years ago Current diet: in general, a "healthy" diet   Current exercise: walking  ------------------------------------------------------------------------   Hypertension, follow-up:  BP Readings from Last 3 Encounters:  03/23/16 122/76  02/22/16 140/68  12/26/15 140/80    She was last seen for hypertension 1 months ago.  Management since that visit includes None .She reports excellent compliance with treatment. She is not having side effects.  She is exercising. She is adherent to low salt diet.   She is experiencing none.  Patient denies chest pain, irregular heart beat, lower extremity edema and palpitations.     Lipid/Cholesterol, Follow-up:   Last seen for this 1 months ago.  Management since that visit includes None.  Last Lipid Panel:    Component Value Date/Time   CHOL 172 05/10/2014   CHOL 158 06/23/2013 0525   TRIG 100 05/10/2014   TRIG 51 06/23/2013 0525   HDL 66 05/10/2014   HDL 81* 06/23/2013 0525   VLDL 10 06/23/2013 0525   LDLCALC 86 05/10/2014   LDLCALC 67 06/23/2013 0525    She reports excellent compliance with  treatment. She is not having side effects.   Wt Readings from Last 3 Encounters:  03/23/16 195 lb (88.451 kg)  02/22/16 197 lb (89.359 kg)  12/26/15 193 lb (87.544 kg)    ------------------------------------------------------------------------        Allergies  Allergen Reactions  . Ace Inhibitors Cough    Other reaction(s): Unknown  . Apixaban     Other reaction(s): Unknown  . Azithromycin     Myasthenia Gravis Patient  . Codeine Itching and Swelling  . Doxycycline   . Lasix [Furosemide] Other (See Comments)    Chest and back pain  . Peanut Butter Flavor     rash?  . Telithromycin     Other reaction(s): Other (See Comments) Myasthenia Gravis Patient   Previous Medications   ASPIRIN 81 MG TABLET    Take 1 tablet by mouth daily.   AZATHIOPRINE (IMURAN) 50 MG TABLET    AZATHIOPRINE, 50MG  (Oral Tablet)  2 1/2 Every Day for 0 days  Quantity: 0.00;  Refills: 0   Ordered :20-Nov-2010  Edmonia James ;  Started 27-Jul-2008 Active Comments: DX: 358.00   CYANOCOBALAMIN 1000 MCG TABLET    Take 1 tablet by mouth daily.   DILTIAZEM (DILACOR XR) 120 MG 24 HR CAPSULE    Take 1 capsule (120 mg total) by mouth daily.   FUROSEMIDE (LASIX) 20 MG TABLET    Take 1 tablet (20 mg total) by mouth  daily.   LEVOTHYROXINE (SYNTHROID, LEVOTHROID) 100 MCG TABLET    TAKE 1 TABLET EVERY DAY   METOCLOPRAMIDE (REGLAN) 10 MG TABLET    TAKE 1 TABLET BY MOUTH TWICE A DAY   OXYBUTYNIN (DITROPAN) 5 MG TABLET    Take 1 tablet (5 mg total) by mouth 2 (two) times daily.   PYRIDOSTIGMINE (MESTINON) 60 MG TABLET    Take 1 tablet by mouth 3 (three) times daily.   SIMVASTATIN (ZOCOR) 10 MG TABLET    TAKE 1 TABLET AT BEDTIME   TIOTROPIUM (SPIRIVA HANDIHALER) 18 MCG INHALATION CAPSULE    Place 1 capsule (18 mcg total) into inhaler and inhale daily.    Review of Systems  Constitutional: Positive for appetite change (Does not have much of an appetitie. ). Negative for fever, chills, diaphoresis, activity  change, fatigue and unexpected weight change.  Respiratory: Positive for apnea, cough, shortness of breath and wheezing.        Pt reports this is much better since starting Spiriva.   Cardiovascular: Negative.   Gastrointestinal: Negative.   Musculoskeletal: Positive for arthralgias. Negative for myalgias, back pain, joint swelling, gait problem, neck pain and neck stiffness.       History of Arthritis.    Neurological: Negative for dizziness, tremors, light-headedness, numbness and headaches.    Social History  Substance Use Topics  . Smoking status: Never Smoker   . Smokeless tobacco: Never Used  . Alcohol Use: No   Objective:   BP 122/76 mmHg  Pulse 60  Temp(Src) 98.3 F (36.8 C) (Oral)  Resp 16  Wt 195 lb (88.451 kg)   Results for orders placed or performed in visit on 03/23/16  POCT glycosylated hemoglobin (Hb A1C)  Result Value Ref Range   Hemoglobin A1C 8.8     Physical Exam  Constitutional: She is oriented to person, place, and time. She appears well-developed and well-nourished.  Cardiovascular: Normal rate, regular rhythm and normal heart sounds.   Pulmonary/Chest: Effort normal and breath sounds normal.  Neurological: She is alert and oriented to person, place, and time.  Skin: Skin is warm and dry.  Psychiatric: She has a normal mood and affect. Her behavior is normal. Judgment and thought content normal.      Assessment & Plan:       1. Essential (primary) hypertension Stable, continue current medications.  2. Diabetes mellitus without complication (HCC) 123XX123 elevated at 8.8%; will restart Metformin 500mg  BID.  Recheck in the summer with Dr. Rosanna Randy.  - POCT glycosylated hemoglobin (Hb A1C) - metFORMIN (GLUCOPHAGE) 500 MG tablet; Take 1 tablet (500 mg total) by mouth 2 (two) times daily with a meal.  Dispense: 180 tablet; Refill: 1  3. Hyperlipemia Stable, continue current medications.    4. Chronic obstructive pulmonary disease, unspecified COPD  type (HCC) Stable, improved on inhaler. Follow up with pulmonologist as scheduled.    Patient was seen and examined by Jerrell Belfast, MD, and note scribed by Ashley Royalty, CMA.  I have reviewed the document for accuracy and completeness and I agree with above. - Jerrell Belfast, MD     Margarita Rana, MD  [facility]

## 2016-03-24 LAB — TSH: TSH: 3.66 u[IU]/mL (ref 0.450–4.500)

## 2016-03-24 LAB — COMPREHENSIVE METABOLIC PANEL
ALBUMIN: 4.2 g/dL (ref 3.5–4.8)
ALK PHOS: 56 IU/L (ref 39–117)
ALT: 11 IU/L (ref 0–32)
AST: 16 IU/L (ref 0–40)
Albumin/Globulin Ratio: 1.3 (ref 1.2–2.2)
BILIRUBIN TOTAL: 1.3 mg/dL — AB (ref 0.0–1.2)
BUN / CREAT RATIO: 22 (ref 12–28)
BUN: 17 mg/dL (ref 8–27)
CHLORIDE: 96 mmol/L (ref 96–106)
CO2: 23 mmol/L (ref 18–29)
CREATININE: 0.79 mg/dL (ref 0.57–1.00)
Calcium: 10 mg/dL (ref 8.7–10.3)
GFR calc Af Amer: 82 mL/min/{1.73_m2} (ref >59)
GFR calc non Af Amer: 71 mL/min/{1.73_m2} (ref >59)
GLUCOSE: 224 mg/dL — AB (ref 65–99)
Globulin, Total: 3.2 g/dL (ref 1.5–4.5)
Potassium: 5 mmol/L (ref 3.5–5.2)
Sodium: 139 mmol/L (ref 134–144)
Total Protein: 7.4 g/dL (ref 6.0–8.5)

## 2016-03-24 LAB — CBC WITH DIFFERENTIAL/PLATELET
BASOS ABS: 0 10*3/uL (ref 0.0–0.2)
Basos: 1 %
EOS (ABSOLUTE): 0.2 10*3/uL (ref 0.0–0.4)
Eos: 4 %
HEMOGLOBIN: 15.5 g/dL (ref 11.1–15.9)
Hematocrit: 45.6 % (ref 34.0–46.6)
Immature Grans (Abs): 0 10*3/uL (ref 0.0–0.1)
Immature Granulocytes: 1 %
LYMPHS ABS: 1.6 10*3/uL (ref 0.7–3.1)
LYMPHS: 32 %
MCH: 32.6 pg (ref 26.6–33.0)
MCHC: 34 g/dL (ref 31.5–35.7)
MCV: 96 fL (ref 79–97)
MONOCYTES: 15 %
Monocytes Absolute: 0.7 10*3/uL (ref 0.1–0.9)
Neutrophils Absolute: 2.4 10*3/uL (ref 1.4–7.0)
Neutrophils: 47 %
PLATELETS: 267 10*3/uL (ref 150–379)
RBC: 4.76 x10E6/uL (ref 3.77–5.28)
RDW: 14.6 % (ref 12.3–15.4)
WBC: 5 10*3/uL (ref 3.4–10.8)

## 2016-03-24 LAB — LIPID PANEL
CHOLESTEROL TOTAL: 161 mg/dL (ref 100–199)
Chol/HDL Ratio: 2.4 ratio units (ref 0.0–4.4)
HDL: 68 mg/dL (ref >39)
LDL CALC: 75 mg/dL (ref 0–99)
TRIGLYCERIDES: 91 mg/dL (ref 0–149)
VLDL Cholesterol Cal: 18 mg/dL (ref 5–40)

## 2016-03-26 ENCOUNTER — Ambulatory Visit: Payer: Self-pay | Admitting: Family Medicine

## 2016-03-27 ENCOUNTER — Other Ambulatory Visit: Payer: Self-pay | Admitting: Family Medicine

## 2016-03-27 DIAGNOSIS — N3941 Urge incontinence: Secondary | ICD-10-CM

## 2016-03-29 ENCOUNTER — Other Ambulatory Visit: Payer: Self-pay | Admitting: Family Medicine

## 2016-03-29 DIAGNOSIS — I482 Chronic atrial fibrillation, unspecified: Secondary | ICD-10-CM

## 2016-03-30 ENCOUNTER — Telehealth: Payer: Self-pay

## 2016-03-30 ENCOUNTER — Other Ambulatory Visit: Payer: Self-pay | Admitting: Specialist

## 2016-03-30 DIAGNOSIS — R911 Solitary pulmonary nodule: Secondary | ICD-10-CM

## 2016-03-30 NOTE — Telephone Encounter (Signed)
Patient called states she saw Dr. Raul Del today and was told that she had some gallstones in her abdomen. And she was told to watch what she eats, she was not sure if it was due to gallstones or something else and wanted [place] to mail her a list of what she can eat. I advised patient to call their office and just to verify what types of food they are talking about and due to what exactly because patient was not sure and we dont have any records yet from the doctor. Patient understood and was going to get in touch with them and ask-aa

## 2016-05-21 ENCOUNTER — Other Ambulatory Visit: Payer: Self-pay | Admitting: Family Medicine

## 2016-05-21 NOTE — Telephone Encounter (Signed)
LOV 03/23/2016. Renaldo Fiddler, CMA

## 2016-06-24 ENCOUNTER — Other Ambulatory Visit: Payer: Self-pay | Admitting: Family Medicine

## 2016-06-24 DIAGNOSIS — E039 Hypothyroidism, unspecified: Secondary | ICD-10-CM

## 2016-07-24 ENCOUNTER — Ambulatory Visit (INDEPENDENT_AMBULATORY_CARE_PROVIDER_SITE_OTHER): Payer: Medicare Other | Admitting: Family Medicine

## 2016-07-24 ENCOUNTER — Encounter: Payer: Self-pay | Admitting: Family Medicine

## 2016-07-24 VITALS — BP 128/74 | HR 58 | Temp 98.2°F | Resp 16 | Wt 200.0 lb

## 2016-07-24 DIAGNOSIS — E785 Hyperlipidemia, unspecified: Secondary | ICD-10-CM

## 2016-07-24 DIAGNOSIS — J441 Chronic obstructive pulmonary disease with (acute) exacerbation: Secondary | ICD-10-CM | POA: Diagnosis not present

## 2016-07-24 DIAGNOSIS — G7000 Myasthenia gravis without (acute) exacerbation: Secondary | ICD-10-CM

## 2016-07-24 DIAGNOSIS — I10 Essential (primary) hypertension: Secondary | ICD-10-CM | POA: Diagnosis not present

## 2016-07-24 DIAGNOSIS — E119 Type 2 diabetes mellitus without complications: Secondary | ICD-10-CM

## 2016-07-24 LAB — POCT GLYCOSYLATED HEMOGLOBIN (HGB A1C)
Est. average glucose Bld gHb Est-mCnc: 163
Hemoglobin A1C: 7.3

## 2016-07-24 NOTE — Progress Notes (Signed)
[name] Female    DOB: [birth year]   81 y.o.   MRN: [id number] Visit Date: 07/24/2016  Today's Provider: Wilhemena Durie, MD   Chief Complaint  Patient presents with  . Diabetes    4 month follow up.   . Hypertension  . Hyperlipidemia   Subjective:    HPI  Diabetes Mellitus Type II, Follow-up:   Lab Results  Component Value Date   HGBA1C 7.3 07/24/2016   HGBA1C 8.8 03/23/2016   HGBA1C 6.7 12/01/2015   Last seen for diabetes 4 months ago.  Management since then includes restarting Metformin 500mg  BID . She reports good compliance with treatment. She is not having side effects.  Home blood sugar records: are not being checked.   Episodes of hypoglycemia? no   Current Insulin Regimen: none Weight trend: stable Prior visit with dietician: no Current diet: well balanced Current exercise: none  ------------------------------------------------------------------------   Hypertension, follow-up:  BP Readings from Last 3 Encounters:  07/24/16 128/74  03/23/16 122/76  02/22/16 140/68    She was last seen for hypertension 4 months ago.  BP at that visit was 122/76. Management since that visit includes no changes.She reports good compliance with treatment. She is not having side effects.  She is not exercising. She is adherent to low salt diet.   Outside blood pressures are not being checked. Patient denies chest pain, chest pressure/discomfort and tachypnea.   Cardiovascular risk factors include diabetes mellitus and dyslipidemia.     Lipid/Cholesterol, Follow-up:   Last seen for this 4 months ago.  Management since that visit includes no changes.  Last Lipid Panel:    Component Value Date/Time   CHOL 161 03/23/2016 0947   CHOL 158 06/23/2013 0525   TRIG 91 03/23/2016 0947   TRIG 51 06/23/2013 0525   HDL 68 03/23/2016 0947   HDL 81 (H) 06/23/2013 0525   CHOLHDL 2.4 03/23/2016 0947   VLDL 10 06/23/2013 0525   LDLCALC 75  03/23/2016 0947   LDLCALC 67 06/23/2013 0525    She reports good compliance with treatment. She is not having side effects.   Wt Readings from Last 3 Encounters:  07/24/16 200 lb (90.7 kg)  03/23/16 195 lb (88.5 kg)  02/22/16 197 lb (89.4 kg)    -      Allergies  Allergen Reactions  . Ace Inhibitors Cough    Other reaction(s): Unknown  . Apixaban     Other reaction(s): Unknown  . Azithromycin     Myasthenia Gravis Patient  . Codeine Itching and Swelling  . Doxycycline   . Lasix [Furosemide] Other (See Comments)    Chest and back pain  . Peanut Butter Flavor     rash?  . Telithromycin     Other reaction(s): Other (See Comments) Myasthenia Gravis Patient   Current Meds  Medication Sig  . aspirin 81 MG tablet Take 1 tablet by mouth daily.  Marland Kitchen azaTHIOprine (IMURAN) 50 MG tablet AZATHIOPRINE, 50MG  (Oral Tablet)  2 1/2 Every Day for 0 days  Quantity: 0.00;  Refills: 0   Ordered :20-Nov-2010  Edmonia James ;  Started 27-Jul-2008 Active Comments: DX: 358.00  . cyanocobalamin 1000 MCG tablet Take 1 tablet by mouth daily.  Marland Kitchen diltiazem (CARDIZEM CD) 120 MG 24 hr capsule TAKE 1 CAPSULE (120 MG TOTAL) BY MOUTH DAILY.  . furosemide (LASIX) 20 MG tablet Take 1 tablet (20 mg total) by mouth daily. (Patient taking differently: Take 20 mg  by mouth every other day. )  . levothyroxine (SYNTHROID, LEVOTHROID) 100 MCG tablet TAKE 1 TABLET EVERY DAY  . metFORMIN (GLUCOPHAGE) 500 MG tablet Take 1 tablet (500 mg total) by mouth 2 (two) times daily with a meal.  . metoCLOPramide (REGLAN) 10 MG tablet TAKE 1 TABLET BY MOUTH TWICE A DAY  . oxybutynin (DITROPAN) 5 MG tablet TAKE 1 TABLET (5 MG TOTAL) BY MOUTH 2 (TWO) TIMES DAILY.  Marland Kitchen pyridostigmine (MESTINON) 60 MG tablet Take 1 tablet by mouth 3 (three) times daily.  . simvastatin (ZOCOR) 10 MG tablet TAKE 1 TABLET AT BEDTIME  . tiotropium (SPIRIVA HANDIHALER) 18 MCG inhalation capsule Place 1 capsule (18 mcg total) into inhaler and inhale  daily.    Review of Systems  Constitutional: Negative.   HENT: Negative.   Eyes: Negative.   Respiratory: Positive for shortness of breath. Negative for apnea, cough, choking, chest tightness, wheezing and stridor.        Has COPD.   Cardiovascular: Positive for leg swelling. Negative for chest pain and palpitations.  Endocrine: Negative.   Musculoskeletal: Negative.   Allergic/Immunologic: Negative.   Neurological: Negative.   Psychiatric/Behavioral: Negative.     Social History  Substance Use Topics  . Smoking status: Never Smoker  . Smokeless tobacco: Never Used  . Alcohol use No   Objective:   BP 128/74 (BP Location: Right Arm, Patient Position: Sitting, Cuff Size: Large)   Pulse (!) 58 Comment: irregular  Temp 98.2 F (36.8 C)   Resp 16   Wt 200 lb (90.7 kg)   BMI 39.06 kg/m   Physical Exam  Constitutional: She is oriented to person, place, and time. She appears well-developed and well-nourished.  HENT:  Head: Normocephalic and atraumatic.  Right Ear: External ear normal.  Left Ear: External ear normal.  Nose: Nose normal.  Fair dentition.  Eyes: Conjunctivae are normal.  Neck: Neck supple. No thyromegaly present.  Cardiovascular: Normal rate, regular rhythm and normal heart sounds.   Pulmonary/Chest: Effort normal. She has wheezes.  Expiratory wheezing through all 4 lobes.   Abdominal: Soft.  Musculoskeletal: She exhibits edema.  Trace lower extremity edema  Lymphadenopathy:    She has no cervical adenopathy.  Neurological: She is alert and oriented to person, place, and time.  Skin: Skin is warm and dry.  Psychiatric: She has a normal mood and affect. Her behavior is normal. Judgment and thought content normal.        Assessment & Plan:     1. Essential (primary) hypertension Flu shot in the fall.  2. Diabetes mellitus without complication ([place]) Much better with the addition of metformin. - POCT glycosylated hemoglobin (Hb A1C)--7.3  today Follow-up 4 months. 3. Hyperlipemia   4. Chronic obstructive pulmonary disease with acute exacerbation ([place]) Follow-up with Dr. Raul Del. Set up for flu clinic 5. Myasthenia gravis ([place]) Follow-up with neurology/[facility] - CBC with Differential/Platelet - Comprehensive metabolic panel       Wilhemena Durie, MD  [facility]

## 2016-07-25 LAB — COMPREHENSIVE METABOLIC PANEL
A/G RATIO: 1.4 (ref 1.2–2.2)
ALK PHOS: 48 IU/L (ref 39–117)
ALT: 9 IU/L (ref 0–32)
AST: 15 IU/L (ref 0–40)
Albumin: 4.3 g/dL (ref 3.5–4.8)
BILIRUBIN TOTAL: 1.4 mg/dL — AB (ref 0.0–1.2)
BUN/Creatinine Ratio: 15 (ref 12–28)
BUN: 11 mg/dL (ref 8–27)
CALCIUM: 10 mg/dL (ref 8.7–10.3)
CHLORIDE: 96 mmol/L (ref 96–106)
CO2: 29 mmol/L (ref 18–29)
Creatinine, Ser: 0.75 mg/dL (ref 0.57–1.00)
GFR calc Af Amer: 88 mL/min/{1.73_m2} (ref >59)
GFR, EST NON AFRICAN AMERICAN: 76 mL/min/{1.73_m2} (ref >59)
Globulin, Total: 3.1 g/dL (ref 1.5–4.5)
Glucose: 150 mg/dL — ABNORMAL HIGH (ref 65–99)
Potassium: 4.9 mmol/L (ref 3.5–5.2)
Sodium: 139 mmol/L (ref 134–144)
Total Protein: 7.4 g/dL (ref 6.0–8.5)

## 2016-07-25 LAB — CBC WITH DIFFERENTIAL/PLATELET
BASOS ABS: 0 10*3/uL (ref 0.0–0.2)
Basos: 1 %
EOS (ABSOLUTE): 0.3 10*3/uL (ref 0.0–0.4)
EOS: 7 %
HEMATOCRIT: 44.8 % (ref 34.0–46.6)
HEMOGLOBIN: 14.5 g/dL (ref 11.1–15.9)
IMMATURE GRANS (ABS): 0 10*3/uL (ref 0.0–0.1)
Immature Granulocytes: 0 %
LYMPHS ABS: 1.2 10*3/uL (ref 0.7–3.1)
LYMPHS: 29 %
MCH: 32 pg (ref 26.6–33.0)
MCHC: 32.4 g/dL (ref 31.5–35.7)
MCV: 99 fL — ABNORMAL HIGH (ref 79–97)
MONOCYTES: 14 %
Monocytes Absolute: 0.6 10*3/uL (ref 0.1–0.9)
Neutrophils Absolute: 2.1 10*3/uL (ref 1.4–7.0)
Neutrophils: 49 %
Platelets: 248 10*3/uL (ref 150–379)
RBC: 4.53 x10E6/uL (ref 3.77–5.28)
RDW: 16.3 % — ABNORMAL HIGH (ref 12.3–15.4)
WBC: 4.2 10*3/uL (ref 3.4–10.8)

## 2016-08-14 ENCOUNTER — Other Ambulatory Visit: Payer: Self-pay | Admitting: Family Medicine

## 2016-08-14 DIAGNOSIS — E119 Type 2 diabetes mellitus without complications: Secondary | ICD-10-CM

## 2016-08-14 DIAGNOSIS — E785 Hyperlipidemia, unspecified: Secondary | ICD-10-CM

## 2016-09-01 ENCOUNTER — Ambulatory Visit (INDEPENDENT_AMBULATORY_CARE_PROVIDER_SITE_OTHER): Payer: Medicare Other

## 2016-09-01 DIAGNOSIS — Z23 Encounter for immunization: Secondary | ICD-10-CM | POA: Diagnosis not present

## 2016-09-20 ENCOUNTER — Other Ambulatory Visit: Payer: Self-pay | Admitting: Family Medicine

## 2016-09-20 DIAGNOSIS — R001 Bradycardia, unspecified: Secondary | ICD-10-CM

## 2016-09-20 DIAGNOSIS — I482 Chronic atrial fibrillation, unspecified: Secondary | ICD-10-CM

## 2016-09-24 NOTE — Telephone Encounter (Signed)
LOV 07/24/2016. Has appointment scheduled 11/29/2016. Renaldo Fiddler, CMA

## 2016-10-04 ENCOUNTER — Other Ambulatory Visit: Payer: Self-pay | Admitting: Family Medicine

## 2016-10-04 DIAGNOSIS — J441 Chronic obstructive pulmonary disease with (acute) exacerbation: Secondary | ICD-10-CM

## 2016-11-12 ENCOUNTER — Other Ambulatory Visit: Payer: Self-pay | Admitting: Family Medicine

## 2016-11-12 DIAGNOSIS — I509 Heart failure, unspecified: Secondary | ICD-10-CM

## 2016-11-29 ENCOUNTER — Ambulatory Visit (INDEPENDENT_AMBULATORY_CARE_PROVIDER_SITE_OTHER): Payer: Medicare Other | Admitting: Family Medicine

## 2016-11-29 VITALS — BP 142/70 | HR 60 | Temp 97.9°F | Resp 18 | Wt 200.0 lb

## 2016-11-29 DIAGNOSIS — G7000 Myasthenia gravis without (acute) exacerbation: Secondary | ICD-10-CM

## 2016-11-29 DIAGNOSIS — E119 Type 2 diabetes mellitus without complications: Secondary | ICD-10-CM | POA: Diagnosis not present

## 2016-11-29 DIAGNOSIS — E039 Hypothyroidism, unspecified: Secondary | ICD-10-CM | POA: Diagnosis not present

## 2016-11-29 LAB — POCT GLYCOSYLATED HEMOGLOBIN (HGB A1C): Hemoglobin A1C: 7

## 2016-11-29 NOTE — Progress Notes (Signed)
Julia Crawford  MRN: [id number] DOB: [birth year]  Subjective:  HPI   The patient is a 81 year old female who presents for follow up of her diabetes.  She was last seen on 07/24/16 and her A1C was improved at 7.3 from 8.8 after having Metformin added to her regiment.  Patient does not check her glucose at home.  She denies any symptoms of hypoglycemias. She has had no problems with her Myasthenia Gravis. The patient is also here to follow up on her hypertension.   BP Readings from Last 3 Encounters:  11/29/16 (!) 142/70  07/24/16 128/74  03/23/16 122/76    Patient Active Problem List   Diagnosis Date Noted  . Diabetes mellitus without complication ([place]) XX123456  . COPD exacerbation ([place]) 12/01/2015  . Acute bronchitis 08/19/2015  . Hyperlipemia 08/16/2015  . Shortness of breath 06/20/2015  . Myasthenia gravis ([place]) 06/06/2015  . Abnormal weight loss 06/06/2015  . Routine general medical examination at a health care facility 05/13/2015  . Body mass index of 60 or higher 05/13/2015  . Congestive heart failure due to high blood pressure ([place]) 05/13/2015  . Acid reflux 05/13/2015  . Hemorrhoids, internal 05/13/2015  . High potassium 05/13/2015  . Candida infection of mouth 05/13/2015  . Arthritis of knee, degenerative 05/13/2015  . Absence of bladder continence 05/13/2015  . CHF (congestive heart failure) ([place]) 04/14/2015  . Atrial fibrillation ([place]) 04/14/2015  . CAFL (chronic airflow limitation) ([place]) 09/24/2014  . Chronic obstructive pulmonary disease ([place]) 09/24/2014  . Essential (primary) hypertension 07/19/2014  . Obstructive apnea 02/04/2012  . 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 Awareness of heartbeats 07/11/2009  . Allergic rhinitis 03/28/2009  . Megaloblastic anemia due to B12 deficiency 12/06/2008  . 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 OP (osteoporosis) 06/08/2004  . Airway hyperreactivity 05/28/2000  . Benign neoplasm of large  bowel 09/09/1998  . Adult hypothyroidism 12/10/1994    Past Medical History:  Diagnosis Date  . Arthritis   . Asthma   . Atrial fibrillation ([place]) 04/14/2015  . COPD (chronic obstructive pulmonary disease) ([place])   . Coronary artery disease   . Diabetes mellitus without complication ([place])   . Emphysema of lung ([place])   . GERD (gastroesophageal reflux disease)   . Hypothyroid   . Irregular heart beat   . Myasthenia gravis [facility])     Social History   Social History  . Marital status: Widowed    Spouse name: N/A  . Number of children: 5  . Years of education: 11th grade   Occupational History  . Retired    Social History Main Topics  . Smoking status: Never Smoker  . Smokeless tobacco: Never Used  . Alcohol use No  . Drug use: No  . Sexual activity: Not Currently   Other Topics Concern  . Not on file   Social History Narrative  . No narrative on file    Outpatient Encounter Prescriptions as of 11/29/2016  Medication Sig Note  . albuterol (PROVENTIL HFA;VENTOLIN HFA) 108 (90 Base) MCG/ACT inhaler Inhale into the lungs every 6 (six) hours as needed for wheezing or shortness of breath.   Marland Kitchen aspirin 81 MG tablet Take 1 tablet by mouth daily.   Marland Kitchen azaTHIOprine (IMURAN) 50 MG tablet AZATHIOPRINE, 50MG  (Oral Tablet)  2 1/2 Every Day for 0 days  Quantity: 0.00;  Refills: 0   Ordered :20-Nov-2010  Edmonia James ;  Started 27-Jul-2008 Active Comments: DX: 358.00 05/12/2015: DX: 358.00 Received from:  [employer]  . cyanocobalamin 1000 MCG tablet Take 1 tablet by mouth daily. 05/12/2015: DX: 281.1 Received from: [place]: VITAMIN B-12, 1000MCG (Oral Tablet)  1 po qd for 0 days  Quantity: 30.00;  Refills: 0   Ordered :20-Nov-2010  Margarita Rana MD;  Started 04-Jan-2010 Active Comments: DX: 28  . diltiazem (CARDIZEM CD) 120 MG 24 hr capsule TAKE 1 CAPSULE (120 MG TOTAL) BY MOUTH DAILY.   Marland Kitchen levothyroxine (SYNTHROID, LEVOTHROID) 100 MCG  tablet TAKE 1 TABLET EVERY DAY   . metFORMIN (GLUCOPHAGE) 500 MG tablet TAKE 1 TABLET (500 MG TOTAL) BY MOUTH 2 (TWO) TIMES DAILY WITH A MEAL.   Marland Kitchen metoCLOPramide (REGLAN) 10 MG tablet TAKE 1 TABLET BY MOUTH TWICE A DAY   . pyridostigmine (MESTINON) 60 MG tablet Take 1 tablet by mouth 3 (three) times daily. 05/12/2015: DX: 358.00 Received from: [place]:   . simvastatin (ZOCOR) 10 MG tablet TAKE 1 TABLET AT BEDTIME   . SPIRIVA HANDIHALER 18 MCG inhalation capsule PLACE 1 CAPSULE (18 MCG TOTAL) INTO INHALER AND INHALE DAILY.   . [DISCONTINUED] diltiazem (CARDIZEM CD) 120 MG 24 hr capsule TAKE 1 CAPSULE EVERY DAY   . [DISCONTINUED] furosemide (LASIX) 20 MG tablet TAKE 1 TABLET (20 MG TOTAL) BY MOUTH DAILY.   . [DISCONTINUED] oxybutynin (DITROPAN) 5 MG tablet TAKE 1 TABLET (5 MG TOTAL) BY MOUTH 2 (TWO) TIMES DAILY.    No facility-administered encounter medications on file as of 11/29/2016.     Allergies  Allergen Reactions  . Ace Inhibitors Cough    Other reaction(s): Unknown  . Apixaban     Other reaction(s): Unknown  . Azithromycin     Myasthenia Gravis Patient  . Codeine Itching and Swelling  . Doxycycline   . Lasix [Furosemide] Other (See Comments)    Chest and back pain  . Peanut Butter Flavor     rash?  . Telithromycin     Other reaction(s): Other (See Comments) Myasthenia Gravis Patient    Review of Systems  Constitutional: Negative for fever and malaise/fatigue.  Respiratory: Positive for cough and wheezing. Shortness of breath: chronic unchanged.   Cardiovascular: Positive for orthopnea. Negative for chest pain, palpitations, claudication and leg swelling.  Gastrointestinal: Negative.   Neurological: Negative for dizziness, weakness and headaches.  Endo/Heme/Allergies: Negative.     Objective:  BP (!) 142/70 (BP Location: Right Arm, Patient Position: Sitting, Cuff Size: Large)   Pulse 60   Temp 97.9 F (36.6 C) (Oral)   Resp 18   Wt  200 lb (90.7 kg)   BMI 39.06 kg/m   Physical Exam  Constitutional: She is oriented to person, place, and time and well-developed, well-nourished, and in no distress.  Pleasant obese BF NAD.  HENT:  Head: Normocephalic and atraumatic.  Right Ear: External ear normal.  Left Ear: External ear normal.  Nose: Nose normal.  Eyes: Conjunctivae are normal. Pupils are equal, round, and reactive to light. No scleral icterus.  Neck: Normal range of motion. No thyromegaly present.  Cardiovascular: Normal rate, regular rhythm and normal heart sounds.   Pulmonary/Chest: Effort normal and breath sounds normal.  Abdominal: Soft.  Neurological: She is alert and oriented to person, place, and time.  Skin: Skin is warm and dry.  Psychiatric: Mood, memory, affect and judgment normal.    Assessment and Plan :    1. Myasthenia gravis ([place])  - CBC with Differential/Platelet - Comprehensive metabolic panel - TSH  2.  Diabetes mellitus without complication ([place]) Adjust going forward to avoid hypoglycemia. - POCT glycosylated hemoglobin (Hb A1C)--7.0 today.  3. Adult hypothyroidism  - TSH 4.Morbid Obesity   HPI, Exam and A&P Transcribed under the direction and in the presence of Wilhemena Durie., MD. Electronically Signed: Althea Charon, RMA I have done the exam and reviewed the chart and it is accurate to the best of my knowledge. Development worker, community has been used and  any errors in dictation or transcription are unintentional. Miguel Aschoff M.D. [facility]

## 2016-11-30 LAB — CBC WITH DIFFERENTIAL/PLATELET
Basophils Absolute: 0 10*3/uL (ref 0.0–0.2)
Basos: 0 %
EOS (ABSOLUTE): 0.2 10*3/uL (ref 0.0–0.4)
EOS: 4 %
HEMOGLOBIN: 14.3 g/dL (ref 11.1–15.9)
Hematocrit: 41.1 % (ref 34.0–46.6)
IMMATURE GRANS (ABS): 0 10*3/uL (ref 0.0–0.1)
Immature Granulocytes: 0 %
LYMPHS ABS: 1.1 10*3/uL (ref 0.7–3.1)
LYMPHS: 24 %
MCH: 33.7 pg — AB (ref 26.6–33.0)
MCHC: 34.8 g/dL (ref 31.5–35.7)
MCV: 97 fL (ref 79–97)
MONOCYTES: 19 %
Monocytes Absolute: 0.9 10*3/uL (ref 0.1–0.9)
Neutrophils Absolute: 2.4 10*3/uL (ref 1.4–7.0)
Neutrophils: 53 %
Platelets: 234 10*3/uL (ref 150–379)
RBC: 4.24 x10E6/uL (ref 3.77–5.28)
RDW: 15.5 % — ABNORMAL HIGH (ref 12.3–15.4)
WBC: 4.6 10*3/uL (ref 3.4–10.8)

## 2016-11-30 LAB — COMPREHENSIVE METABOLIC PANEL
ALBUMIN: 4.6 g/dL (ref 3.5–4.8)
ALT: 6 IU/L (ref 0–32)
AST: 13 IU/L (ref 0–40)
Albumin/Globulin Ratio: 2 (ref 1.2–2.2)
Alkaline Phosphatase: 48 IU/L (ref 39–117)
BILIRUBIN TOTAL: 1.1 mg/dL (ref 0.0–1.2)
BUN / CREAT RATIO: 14 (ref 12–28)
BUN: 11 mg/dL (ref 8–27)
CALCIUM: 9.4 mg/dL (ref 8.7–10.3)
CO2: 28 mmol/L (ref 18–29)
CREATININE: 0.76 mg/dL (ref 0.57–1.00)
Chloride: 100 mmol/L (ref 96–106)
GFR, EST AFRICAN AMERICAN: 86 mL/min/{1.73_m2} (ref >59)
GFR, EST NON AFRICAN AMERICAN: 75 mL/min/{1.73_m2} (ref >59)
GLUCOSE: 117 mg/dL — AB (ref 65–99)
Globulin, Total: 2.3 g/dL (ref 1.5–4.5)
Potassium: 4.6 mmol/L (ref 3.5–5.2)
Sodium: 142 mmol/L (ref 134–144)
TOTAL PROTEIN: 6.9 g/dL (ref 6.0–8.5)

## 2016-11-30 LAB — TSH: TSH: 2.78 u[IU]/mL (ref 0.450–4.500)

## 2016-12-31 ENCOUNTER — Ambulatory Visit
Admission: RE | Admit: 2016-12-31 | Discharge: 2016-12-31 | Disposition: A | Discharge status: Home / Self Care | Payer: Medicare Other | Source: Ambulatory Visit | Attending: Specialist | Admitting: Specialist

## 2016-12-31 DIAGNOSIS — R918 Other nonspecific abnormal finding of lung field: Secondary | ICD-10-CM | POA: Diagnosis not present

## 2016-12-31 DIAGNOSIS — K8020 Calculus of gallbladder without cholecystitis without obstruction: Secondary | ICD-10-CM | POA: Diagnosis not present

## 2016-12-31 DIAGNOSIS — I700 Atherosclerosis of aorta: Secondary | ICD-10-CM | POA: Insufficient documentation

## 2016-12-31 DIAGNOSIS — I2510 Atherosclerotic heart disease of native coronary artery without angina pectoris: Secondary | ICD-10-CM | POA: Insufficient documentation

## 2016-12-31 DIAGNOSIS — R911 Solitary pulmonary nodule: Secondary | ICD-10-CM | POA: Diagnosis present

## 2016-12-31 DIAGNOSIS — I517 Cardiomegaly: Secondary | ICD-10-CM | POA: Diagnosis not present

## 2016-12-31 DIAGNOSIS — K449 Diaphragmatic hernia without obstruction or gangrene: Secondary | ICD-10-CM | POA: Insufficient documentation

## 2017-02-13 ENCOUNTER — Other Ambulatory Visit: Payer: Self-pay | Admitting: Family Medicine

## 2017-02-13 DIAGNOSIS — E785 Hyperlipidemia, unspecified: Secondary | ICD-10-CM

## 2017-02-26 ENCOUNTER — Other Ambulatory Visit: Payer: Self-pay | Admitting: Family Medicine

## 2017-02-26 DIAGNOSIS — E119 Type 2 diabetes mellitus without complications: Secondary | ICD-10-CM

## 2017-02-28 ENCOUNTER — Other Ambulatory Visit: Payer: Self-pay | Admitting: Family Medicine

## 2017-02-28 ENCOUNTER — Other Ambulatory Visit: Payer: Self-pay

## 2017-02-28 DIAGNOSIS — E785 Hyperlipidemia, unspecified: Secondary | ICD-10-CM

## 2017-02-28 DIAGNOSIS — E119 Type 2 diabetes mellitus without complications: Secondary | ICD-10-CM

## 2017-02-28 MED ORDER — METOCLOPRAMIDE HCL 10 MG PO TABS: 10.0000 mg | ORAL_TABLET | Freq: Two times a day (BID) | ORAL | 3 refills | Status: DC

## 2017-02-28 MED ORDER — METFORMIN HCL 500 MG PO TABS: 500.0000 mg | ORAL_TABLET | Freq: Two times a day (BID) | ORAL | 2 refills | Status: DC

## 2017-02-28 MED ORDER — SIMVASTATIN 10 MG PO TABS: 10.0000 mg | ORAL_TABLET | Freq: Every day | ORAL | 1 refills | Status: DC

## 2017-02-28 NOTE — Telephone Encounter (Signed)
Pt needs refill on her   simvastatin (ZOCOR) 10 MG tablet  metFORMIN (GLUCOPHAGE) 500 MG tablet  metoCLOPramide (REGLAN) 10 MG tablet  [facility] uses [facility]

## 2017-03-18 ENCOUNTER — Other Ambulatory Visit: Payer: Self-pay | Admitting: Family Medicine

## 2017-03-18 DIAGNOSIS — I482 Chronic atrial fibrillation, unspecified: Secondary | ICD-10-CM

## 2017-04-01 ENCOUNTER — Ambulatory Visit (INDEPENDENT_AMBULATORY_CARE_PROVIDER_SITE_OTHER): Payer: Medicare Other | Admitting: Family Medicine

## 2017-04-01 VITALS — BP 142/78 | HR 68 | Temp 97.9°F | Resp 20 | Wt 205.0 lb

## 2017-04-01 DIAGNOSIS — J449 Chronic obstructive pulmonary disease, unspecified: Secondary | ICD-10-CM

## 2017-04-01 DIAGNOSIS — G7000 Myasthenia gravis without (acute) exacerbation: Secondary | ICD-10-CM | POA: Diagnosis not present

## 2017-04-01 DIAGNOSIS — I10 Essential (primary) hypertension: Secondary | ICD-10-CM | POA: Diagnosis not present

## 2017-04-01 DIAGNOSIS — E039 Hypothyroidism, unspecified: Secondary | ICD-10-CM | POA: Diagnosis not present

## 2017-04-01 DIAGNOSIS — G4733 Obstructive sleep apnea (adult) (pediatric): Secondary | ICD-10-CM

## 2017-04-01 DIAGNOSIS — R439 Unspecified disturbances of smell and taste: Secondary | ICD-10-CM | POA: Diagnosis not present

## 2017-04-01 DIAGNOSIS — E7849 Other hyperlipidemia: Secondary | ICD-10-CM

## 2017-04-01 DIAGNOSIS — E784 Other hyperlipidemia: Secondary | ICD-10-CM | POA: Diagnosis not present

## 2017-04-01 DIAGNOSIS — F331 Major depressive disorder, recurrent, moderate: Secondary | ICD-10-CM | POA: Diagnosis not present

## 2017-04-01 DIAGNOSIS — E119 Type 2 diabetes mellitus without complications: Secondary | ICD-10-CM

## 2017-04-01 LAB — POCT GLYCOSYLATED HEMOGLOBIN (HGB A1C): HEMOGLOBIN A1C: 6.8

## 2017-04-01 MED ORDER — SERTRALINE HCL 25 MG PO TABS: 25.0000 mg | ORAL_TABLET | Freq: Every day | ORAL | 12 refills | Status: DC

## 2017-04-01 NOTE — Progress Notes (Signed)
Julia Crawford  MRN: [id number] DOB: [birth year]  Subjective:  HPI  Patient is here for 4 months follow up. LOV was 11/29/16. Patient does not check her sugar at home. Last eye exam was over 2 years ago. No tingling sensation present. Lab Results  Component Value Date   HGBA1C 7.0 11/29/2016   Patient does not check her b/p at home. No cardiac symptom present. BP Readings from Last 3 Encounters:  04/01/17 (!) 142/78  11/29/16 (!) 142/70  07/24/16 128/74   Depression screen PHQ 2/9 04/01/2017 02/22/2016 10/14/2015  Decreased Interest 3 0 0  Down, Depressed, Hopeless 3 1 0  PHQ - 2 Score 6 1 0  Altered sleeping 3 3 -  Tired, decreased energy 0 0 -  Change in appetite 3 0 -  Feeling bad or failure about yourself  0 0 -  Trouble concentrating 0 0 -  Moving slowly or fidgety/restless 0 0 -  Suicidal thoughts 1 0 -  PHQ-9 Score 13 4 -  Difficult doing work/chores - Not difficult at all -   Wt Readings from Last 3 Encounters:  04/01/17 205 lb (93 kg)  11/29/16 200 lb (90.7 kg)  07/24/16 200 lb (90.7 kg)   Last lab work done on 11/29/16 CBC, MetC and TSH. Last lipid was done on 03/23/16.  Patient Active Problem List   Diagnosis Date Noted  . Diabetes mellitus without complication ([place]) 78/29/5621  . COPD exacerbation ([place]) 12/01/2015  . Acute bronchitis 08/19/2015  . Hyperlipemia 08/16/2015  . Shortness of breath 06/20/2015  . Myasthenia gravis ([place]) 06/06/2015  . Abnormal weight loss 06/06/2015  . Routine general medical examination at a health care facility 05/13/2015  . Body mass index of 60 or higher 05/13/2015  . Congestive heart failure due to high blood pressure ([place]) 05/13/2015  . Acid reflux 05/13/2015  . Hemorrhoids, internal 05/13/2015  . High potassium 05/13/2015  . Candida infection of mouth 05/13/2015  . Arthritis of knee, degenerative 05/13/2015  . Absence of bladder continence 05/13/2015  . CHF (congestive heart failure) ([place]) 04/14/2015  . Atrial  fibrillation ([place]) 04/14/2015  . CAFL (chronic airflow limitation) ([place]) 09/24/2014  . Chronic obstructive pulmonary disease ([place]) 09/24/2014  . Essential (primary) hypertension 07/19/2014  . Obstructive apnea 02/04/2012  . 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 Awareness of heartbeats 07/11/2009  . Allergic rhinitis 03/28/2009  . Megaloblastic anemia due to B12 deficiency 12/06/2008  . 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 OP (osteoporosis) 06/08/2004  . Airway hyperreactivity 05/28/2000  . 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 Adult hypothyroidism 12/10/1994    Past Medical History:  Diagnosis Date  . Arthritis   . Asthma   . Atrial fibrillation ([place]) 04/14/2015  . COPD (chronic obstructive pulmonary disease) ([place])   . Coronary artery disease   . Diabetes mellitus without complication ([place])   . Emphysema of lung ([place])   . GERD (gastroesophageal reflux disease)   . Hypothyroid   . Irregular heart beat   . Myasthenia gravis [facility])     Social History   Social History  . Marital status: Widowed    Spouse name: N/A  . Number of children: 5  . Years of education: 11th grade   Occupational History  . Retired    Social History Main Topics  . Smoking status: Never Smoker  . Smokeless tobacco: Never Used  . Alcohol use No  . Drug use: No  . Sexual activity: Not Currently  Other Topics Concern  . Not on file   Social History Narrative  . No narrative on file    Outpatient Encounter Prescriptions as of 04/01/2017  Medication Sig Note  . albuterol (PROVENTIL HFA;VENTOLIN HFA) 108 (90 Base) MCG/ACT inhaler Inhale into the lungs every 6 (six) hours as needed for wheezing or shortness of breath.   Marland Kitchen aspirin 81 MG tablet Take 1 tablet by mouth daily.   Marland Kitchen azaTHIOprine (IMURAN) 50 MG tablet AZATHIOPRINE, 50MG  (Oral Tablet)  2 1/2 Every Day for 0 days  Quantity: 0.00;  Refills: 0   Ordered :20-Nov-2010  Edmonia James ;  Started  27-Jul-2008 Active Comments: DX: 358.00 05/12/2015: DX: 358.00 Received from: [employer]  . cyanocobalamin 1000 MCG tablet Take 1 tablet by mouth daily. 05/12/2015: DX: 281.1 Received from: [place]: VITAMIN B-12, 1000MCG (Oral Tablet)  1 po qd for 0 days  Quantity: 30.00;  Refills: 0   Ordered :20-Nov-2010  Margarita Rana MD;  Started 04-Jan-2010 Active Comments: DX: 28  . diltiazem (CARDIZEM CD) 120 MG 24 hr capsule TAKE 1 CAPSULE (120 MG TOTAL) BY MOUTH DAILY.   Marland Kitchen diltiazem (CARDIZEM CD) 120 MG 24 hr capsule TAKE 1 CAPSULE EVERY DAY   . levothyroxine (SYNTHROID, LEVOTHROID) 100 MCG tablet TAKE 1 TABLET EVERY DAY   . metFORMIN (GLUCOPHAGE) 500 MG tablet Take 1 tablet (500 mg total) by mouth 2 (two) times daily with a meal.   . metoCLOPramide (REGLAN) 10 MG tablet Take 1 tablet (10 mg total) by mouth 2 (two) times daily.   Marland Kitchen pyridostigmine (MESTINON) 60 MG tablet Take 1 tablet by mouth 3 (three) times daily. 05/12/2015: DX: 358.00 Received from: [place]:   . simvastatin (ZOCOR) 10 MG tablet Take 1 tablet (10 mg total) by mouth at bedtime.   Marland Kitchen SPIRIVA HANDIHALER 18 MCG inhalation capsule PLACE 1 CAPSULE (18 MCG TOTAL) INTO INHALER AND INHALE DAILY.    No facility-administered encounter medications on file as of 04/01/2017.     Allergies  Allergen Reactions  . Ace Inhibitors Cough    Other reaction(s): Unknown  . Apixaban     Other reaction(s): Unknown  . Azithromycin     Myasthenia Gravis Patient  . Codeine Itching and Swelling  . Doxycycline   . Lasix [Furosemide] Other (See Comments)    Chest and back pain  . Peanut Butter Flavor     rash?  . Telithromycin     Other reaction(s): Other (See Comments) Myasthenia Gravis Patient    Review of Systems  Constitutional: Negative.   HENT:       Not tasting and not smelling things good.  Eyes: Negative.   Respiratory: Positive for shortness of breath (with  long distance walk).   Cardiovascular: Negative.   Gastrointestinal: Negative.        Bad taste in her mouth  Musculoskeletal: Positive for joint pain.       Stiffness. Using cane to ambulate  Skin: Negative.   Endo/Heme/Allergies: Negative.   Psychiatric/Behavioral: Positive for depression. Negative for hallucinations and substance abuse. The patient has insomnia.     Objective:  BP (!) 142/78   Pulse 68   Temp 97.9 F (36.6 C)   Resp 20   Wt 205 lb (93 kg)   SpO2 98%   BMI 40.04 kg/m   Physical Exam  Constitutional: She is oriented to person, place, and time and well-developed, well-nourished, and in no distress.  Obese female in  NAD.In wheelchair.  HENT:  Head: Normocephalic and atraumatic.  Right Ear: External ear normal.  Left Ear: External ear normal.  Nose: Nose normal.  Mouth/Throat: Oropharynx is clear and moist.  Eyes: Conjunctivae are normal. Pupils are equal, round, and reactive to light.  Neck: Normal range of motion. Neck supple. No thyromegaly present.  Cardiovascular: Normal rate, regular rhythm, normal heart sounds and intact distal pulses.   No murmur heard. Pulmonary/Chest: Effort normal and breath sounds normal. No respiratory distress. She has no wheezes.  Abdominal: Soft. She exhibits no distension. There is no tenderness.  Neurological: She is alert and oriented to person, place, and time.  Using cane to ambulate.  Skin: Skin is warm and dry.  Psychiatric: Memory, affect and judgment normal. She exhibits a depressed mood.    Assessment and Plan :  1. Myasthenia gravis ([place]) 2. Diabetes mellitus without complication (HCC) Y5W 6.8. Better. Continue to follow. - POCT HgB A1C  3. Adult hypothyroidism 4. Essential (primary) hypertension Stable. Follow.  5. Other hyperlipidemia  6. Chronic obstructive pulmonary disease, unspecified COPD type ([place])  7. Moderate episode of recurrent major depressive disorder [facility]) Discussed this with patient and  her daughter today. PHQ9 score today is 13. Start Sertraline 25 mg. Will re check on the next visit. Consider Remeron-will follow. 8. OSA Using CPAP machine at night. 9. Decreased taste and smell Offered referral to ENT for further work up but patient does not want to do that at this time. Patient advised to ask pharmacist about what medication she is on that could be contributing to the symptoms she is having.  HPI, Exam and A&P transcribed by Theressa Millard, RMA under direction and in the presence of Miguel Aschoff, MD.

## 2017-05-07 ENCOUNTER — Ambulatory Visit (INDEPENDENT_AMBULATORY_CARE_PROVIDER_SITE_OTHER): Payer: Medicare Other | Admitting: Family Medicine

## 2017-05-07 VITALS — BP 138/62 | HR 58 | Resp 18 | Wt 205.0 lb

## 2017-05-07 DIAGNOSIS — E119 Type 2 diabetes mellitus without complications: Secondary | ICD-10-CM

## 2017-05-07 DIAGNOSIS — F331 Major depressive disorder, recurrent, moderate: Secondary | ICD-10-CM

## 2017-05-07 NOTE — Progress Notes (Signed)
Julia Crawford  MRN: [id number] DOB: 1935/03/28  Subjective:  HPI  Patient is here for follow up. Last office visit was on 04/01/17. For depression added Sertraline 25 mg. Patient is feeling better,  About 75% better since starting medication. No side effects that she can tell. Depression screen [facility] 2/9 05/07/2017 04/01/2017 02/22/2016  Decreased Interest 3 3 0  Down, Depressed, Hopeless 1 3 1   PHQ - 2 Score 4 6 1   Altered sleeping 3 3 3   Tired, decreased energy 0 0 0  Change in appetite 2 3 0  Feeling bad or failure about yourself  0 0 0  Trouble concentrating 0 0 0  Moving slowly or fidgety/restless 0 0 0  Suicidal thoughts 0 1 0  PHQ-9 Score 9 13 4   Difficult doing work/chores Somewhat difficult - Not difficult at all   Patient Active Problem List   Diagnosis Date Noted  . Diabetes mellitus without complication ([place]) 19/14/7829  . COPD exacerbation ([place]) 12/01/2015  . Acute bronchitis 08/19/2015  . Hyperlipemia 08/16/2015  . Shortness of breath 06/20/2015  . Myasthenia gravis ([place]) 06/06/2015  . Abnormal weight loss 06/06/2015  . Routine general medical examination at a health care facility 05/13/2015  . Body mass index of 60 or higher 05/13/2015  . Congestive heart failure due to high blood pressure ([place]) 05/13/2015  . Acid reflux 05/13/2015  . Hemorrhoids, internal 05/13/2015  . High potassium 05/13/2015  . Candida infection of mouth 05/13/2015  . Arthritis of knee, degenerative 05/13/2015  . Absence of bladder continence 05/13/2015  . CHF (congestive heart failure) ([place]) 04/14/2015  . Atrial fibrillation ([place]) 04/14/2015  . CAFL (chronic airflow limitation) ([place]) 09/24/2014  . Chronic obstructive pulmonary disease ([place]) 09/24/2014  . Essential (primary) hypertension 07/19/2014  . Obstructive apnea 02/04/2012  . 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 Awareness of heartbeats 07/11/2009  . Allergic rhinitis 03/28/2009  . Megaloblastic anemia  due to B12 deficiency 12/06/2008  . 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 OP (osteoporosis) 06/08/2004  . Airway hyperreactivity 05/28/2000  . 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 Adult hypothyroidism 12/10/1994    Past Medical History:  Diagnosis Date  . Arthritis   . Asthma   . Atrial fibrillation ([place]) 04/14/2015  . COPD (chronic obstructive pulmonary disease) ([place])   . Coronary artery disease   . Diabetes mellitus without complication ([place])   . Emphysema of lung ([place])   . GERD (gastroesophageal reflux disease)   . Hypothyroid   . Irregular heart beat   . Myasthenia gravis [facility])     Social History   Social History  . Marital status: Widowed    Spouse name: N/A  . Number of children: 5  . Years of education: 11th grade   Occupational History  . Retired    Social History Main Topics  . Smoking status: Never Smoker  . Smokeless tobacco: Never Used  . Alcohol use No  . Drug use: No  . Sexual activity: Not Currently   Other Topics Concern  . Not on file   Social History Narrative  . No narrative on file    Outpatient Encounter Prescriptions as of 05/07/2017  Medication Sig Note  . albuterol (PROVENTIL HFA;VENTOLIN HFA) 108 (90 Base) MCG/ACT inhaler Inhale into the lungs every 6 (six) hours as needed for wheezing or shortness of breath.   Marland Kitchen aspirin 81 MG tablet Take 1 tablet by mouth daily.   Marland Kitchen azaTHIOprine (IMURAN) 50 MG tablet AZATHIOPRINE,  50MG  (Oral Tablet)  2 1/2 Every Day for 0 days  Quantity: 0.00;  Refills: 0   Ordered :20-Nov-2010  Edmonia James ;  Started 27-Jul-2008 Active Comments: DX: 358.00 05/12/2015: DX: 358.00 Received from: [employer]  . cyanocobalamin 1000 MCG tablet Take 1 tablet by mouth daily. 05/12/2015: DX: 281.1 Received from: [place]: VITAMIN B-12, 1000MCG (Oral Tablet)  1 po qd for 0 days  Quantity: 30.00;  Refills: 0   Ordered :20-Nov-2010  Margarita Rana MD;  Started  04-Jan-2010 Active Comments: DX: 28  . diltiazem (CARDIZEM CD) 120 MG 24 hr capsule TAKE 1 CAPSULE (120 MG TOTAL) BY MOUTH DAILY.   Marland Kitchen levothyroxine (SYNTHROID, LEVOTHROID) 100 MCG tablet TAKE 1 TABLET EVERY DAY   . metFORMIN (GLUCOPHAGE) 500 MG tablet Take 1 tablet (500 mg total) by mouth 2 (two) times daily with a meal.   . metoCLOPramide (REGLAN) 10 MG tablet Take 1 tablet (10 mg total) by mouth 2 (two) times daily.   . NON FORMULARY CPAP MACHINE AT NIGHT TIME   . pyridostigmine (MESTINON) 60 MG tablet Take 1 tablet by mouth 3 (three) times daily. 05/12/2015: DX: 358.00 Received from: [place]:   . sertraline (ZOLOFT) 25 MG tablet Take 1 tablet (25 mg total) by mouth daily.   . simvastatin (ZOCOR) 10 MG tablet Take 1 tablet (10 mg total) by mouth at bedtime.   Marland Kitchen SPIRIVA HANDIHALER 18 MCG inhalation capsule PLACE 1 CAPSULE (18 MCG TOTAL) INTO INHALER AND INHALE DAILY.   . [DISCONTINUED] diltiazem (CARDIZEM CD) 120 MG 24 hr capsule TAKE 1 CAPSULE EVERY DAY    No facility-administered encounter medications on file as of 05/07/2017.     Allergies  Allergen Reactions  . Ace Inhibitors Cough    Other reaction(s): Unknown  . Apixaban     Other reaction(s): Unknown  . Azithromycin     Myasthenia Gravis Patient  . Butylene Glycol Other (See Comments)  . Codeine Itching and Swelling  . Doxycycline   . Lasix [Furosemide] Other (See Comments)    Chest and back pain  . Peanut Butter Flavor     rash?  . Telithromycin     Other reaction(s): Other (See Comments) Myasthenia Gravis Patient    Review of Systems  Constitutional: Negative.   Eyes: Negative.   Respiratory: Negative.   Cardiovascular: Negative.   Gastrointestinal: Negative.   Musculoskeletal: Negative.   Endo/Heme/Allergies: Negative.   Psychiatric/Behavioral: Positive for depression.    Objective:  BP 138/62   Pulse (!) 58   Resp 18   Wt 205 lb (93 kg)   SpO2 93%   BMI 40.04 kg/m    Physical Exam  Constitutional: She is oriented to person, place, and time and well-developed, well-nourished, and in no distress.  HENT:  Head: Normocephalic and atraumatic.  Eyes: Conjunctivae are normal. No scleral icterus.  Neck: No thyromegaly present.  Cardiovascular: Normal rate, regular rhythm and normal heart sounds.   Pulmonary/Chest: Effort normal and breath sounds normal.  Abdominal: Soft.  Neurological: She is alert and oriented to person, place, and time.  Skin: Skin is warm and dry.  Psychiatric: Mood, memory, affect and judgment normal.    Assessment and Plan :  Depression Improved. Continue sertraline for at least year. Obesity Myasthenia Gravis  I have done the exam and reviewed the chart and it is accurate to the best of my knowledge. Development worker, community has been used and  any errors in  dictation or transcription are unintentional. Miguel Aschoff M.D. [facility]

## 2017-05-08 ENCOUNTER — Telehealth: Payer: Self-pay | Admitting: Family Medicine

## 2017-05-08 NOTE — Telephone Encounter (Signed)
Spoke with patient and pharmacist there was a misunderstanding, pharmacist said Zoloft is been filled now-aa

## 2017-05-08 NOTE — Telephone Encounter (Signed)
Pt went to pick up her mediation at the pharmacy and they told her someone else has already picked it up.  She said no one has picked it up because it wasn't ready.  Pt wants [place] to call the pharmacy and tell them no one has picked up her medication...  sertraline (ZOLOFT) 25 MG tablet  [facility]  Pt's call back is [phone]  Con Memos   .

## 2017-06-04 ENCOUNTER — Other Ambulatory Visit: Payer: Self-pay | Admitting: Family Medicine

## 2017-06-04 DIAGNOSIS — E039 Hypothyroidism, unspecified: Secondary | ICD-10-CM

## 2017-06-07 ENCOUNTER — Other Ambulatory Visit: Payer: Self-pay | Admitting: Family Medicine

## 2017-06-07 MED ORDER — SERTRALINE HCL 25 MG PO TABS: 25.0000 mg | ORAL_TABLET | Freq: Every day | ORAL | 3 refills | Status: DC

## 2017-06-07 NOTE — Telephone Encounter (Signed)
[facility] faxed refill request for  sertraline (ZOLOFT) 25 MG tablet  90 day Supply The last Rx that was sent is still valid but it is a 30 day supply and they are requesting a 90 day supply. Please advise. Thanks TNP

## 2017-06-26 ENCOUNTER — Telehealth: Payer: Self-pay | Admitting: Family Medicine

## 2017-06-26 NOTE — Telephone Encounter (Signed)
Pt called wanting to know if we have gotten her labs back from the lab Monday.   Her call back is [phone]   Thanks,  Con Memos

## 2017-06-26 NOTE — Telephone Encounter (Signed)
Pt had labs done through [facility], not ordered by [place]. She will call Dr. Nadara Mustard for results.

## 2017-07-18 ENCOUNTER — Telehealth: Payer: Self-pay | Admitting: Family Medicine

## 2017-07-18 NOTE — Telephone Encounter (Signed)
Please review-aa 

## 2017-07-18 NOTE — Telephone Encounter (Signed)
Pt is requesting a nurse aide to come in to help her with bathing and helping her with house work/washing clothes.  CB#[phone] or [phone]

## 2017-07-22 NOTE — Telephone Encounter (Signed)
Curt Bears, Is this something you could work on for this patient.  I don't think Insurance usually pays to have this type of care in the home.  I think they only pay for actual nursing care, but I am not sure.  Is this a referral we can make and have you check on community and or home health resources for this lady.     If this is not the appropriate type of referral to send to you please let me know.  Thank you.    Jiles Garter

## 2017-07-22 NOTE — Telephone Encounter (Signed)
ok 

## 2017-08-04 ENCOUNTER — Other Ambulatory Visit: Payer: Self-pay | Admitting: Family Medicine

## 2017-08-04 DIAGNOSIS — I509 Heart failure, unspecified: Secondary | ICD-10-CM

## 2017-08-13 ENCOUNTER — Ambulatory Visit (INDEPENDENT_AMBULATORY_CARE_PROVIDER_SITE_OTHER): Payer: Medicare Other | Admitting: Family Medicine

## 2017-08-13 ENCOUNTER — Encounter: Payer: Self-pay | Admitting: Family Medicine

## 2017-08-13 VITALS — BP 118/68 | HR 68 | Temp 98.0°F | Resp 16 | Wt 202.0 lb

## 2017-08-13 DIAGNOSIS — E119 Type 2 diabetes mellitus without complications: Secondary | ICD-10-CM

## 2017-08-13 DIAGNOSIS — G7000 Myasthenia gravis without (acute) exacerbation: Secondary | ICD-10-CM | POA: Diagnosis not present

## 2017-08-13 DIAGNOSIS — Z23 Encounter for immunization: Secondary | ICD-10-CM

## 2017-08-13 DIAGNOSIS — F331 Major depressive disorder, recurrent, moderate: Secondary | ICD-10-CM

## 2017-08-13 DIAGNOSIS — J449 Chronic obstructive pulmonary disease, unspecified: Secondary | ICD-10-CM

## 2017-08-13 LAB — POCT GLYCOSYLATED HEMOGLOBIN (HGB A1C): HEMOGLOBIN A1C: 6.9

## 2017-08-13 NOTE — Progress Notes (Signed)
[name] Female    DOB: Apr 08, 1935   82 y.o.   MRN: [id number] Visit Date: 08/13/2017  Today's Provider: Wilhemena Durie, MD   Chief Complaint  Patient presents with  . Diabetes  . Depression   Subjective:    HPI  Diabetes Mellitus Type II, Follow-up:   Lab Results  Component Value Date   HGBA1C 6.8 04/01/2017   HGBA1C 7.0 11/29/2016   HGBA1C 7.3 07/24/2016    Last seen for diabetes 3 months ago.  Management since then includes none. She reports good compliance with treatment. She is not having side effects.  Home blood sugar records: not being checked  Episodes of hypoglycemia? no   Pertinent Labs:    Component Value Date/Time   CHOL 161 03/23/2016 0947   CHOL 158 06/23/2013 0525   TRIG 91 03/23/2016 0947   TRIG 51 06/23/2013 0525   HDL 68 03/23/2016 0947   HDL 81 (H) 06/23/2013 0525   LDLCALC 75 03/23/2016 0947   LDLCALC 67 06/23/2013 0525   CREATININE 0.76 11/29/2016 1220   CREATININE 0.92 03/14/2014 0401    Wt Readings from Last 3 Encounters:  08/13/17 202 lb (91.6 kg)  05/07/17 205 lb (93 kg)  04/01/17 205 lb (93 kg)    Pt would like to discuss her ox needs and per her the home she lives in they would like home health to come out and help with personal care.  ------------------------------------------------------------------------     Allergies  Allergen Reactions  . Ace Inhibitors Cough    Other reaction(s): Unknown  . Apixaban     Other reaction(s): Unknown  . Azithromycin     Myasthenia Gravis Patient  . Butylene Glycol Other (See Comments)  . Codeine Itching and Swelling  . Doxycycline   . Lasix [Furosemide] Other (See Comments)    Chest and back pain  . Peanut Butter Flavor     rash?  . Telithromycin     Other reaction(s): Other (See Comments) Myasthenia Gravis Patient     Current Outpatient Prescriptions:  .  albuterol (PROVENTIL HFA;VENTOLIN HFA) 108 (90 Base) MCG/ACT inhaler, Inhale into the lungs  every 6 (six) hours as needed for wheezing or shortness of breath., Disp: , Rfl:  .  aspirin 81 MG tablet, Take 1 tablet by mouth daily., Disp: , Rfl:  .  azaTHIOprine (IMURAN) 50 MG tablet, AZATHIOPRINE, 50MG  (Oral Tablet)  2 1/2 Every Day for 0 days  Quantity: 0.00;  Refills: 0   Ordered :20-Nov-2010  Edmonia James ;  Started 27-Jul-2008 Active Comments: DX: 358.00, Disp: , Rfl:  .  cyanocobalamin 1000 MCG tablet, Take 1 tablet by mouth daily., Disp: , Rfl:  .  diltiazem (CARDIZEM CD) 120 MG 24 hr capsule, TAKE 1 CAPSULE (120 MG TOTAL) BY MOUTH DAILY., Disp: 30 capsule, Rfl: 11 .  furosemide (LASIX) 20 MG tablet, TAKE 1 TABLET (20 MG TOTAL) BY MOUTH DAILY., Disp: 90 tablet, Rfl: 3 .  levothyroxine (SYNTHROID, LEVOTHROID) 100 MCG tablet, TAKE 1 TABLET BY MOUTH EVERY DAY, Disp: 90 tablet, Rfl: 3 .  metFORMIN (GLUCOPHAGE) 500 MG tablet, Take 1 tablet (500 mg total) by mouth 2 (two) times daily with a meal., Disp: 180 tablet, Rfl: 2 .  metoCLOPramide (REGLAN) 10 MG tablet, Take 1 tablet (10 mg total) by mouth 2 (two) times daily., Disp: 180 tablet, Rfl: 3 .  NON FORMULARY, CPAP MACHINE AT NIGHT TIME, Disp: , Rfl:  .  pyridostigmine (MESTINON)  60 MG tablet, Take 1 tablet by mouth 3 (three) times daily., Disp: , Rfl:  .  sertraline (ZOLOFT) 25 MG tablet, Take 1 tablet (25 mg total) by mouth daily., Disp: 90 tablet, Rfl: 3 .  simvastatin (ZOCOR) 10 MG tablet, Take 1 tablet (10 mg total) by mouth at bedtime., Disp: 90 tablet, Rfl: 1 .  SPIRIVA HANDIHALER 18 MCG inhalation capsule, PLACE 1 CAPSULE (18 MCG TOTAL) INTO INHALER AND INHALE DAILY., Disp: 30 capsule, Rfl: 11  Review of Systems  Constitutional: Negative.   HENT: Negative.   Eyes: Negative.   Respiratory: Negative.   Cardiovascular: Negative.   Gastrointestinal: Negative.   Endocrine: Negative.   Genitourinary: Negative.   Musculoskeletal: Negative.   Skin: Negative.   Allergic/Immunologic: Negative.   Neurological: Negative.     Hematological: Negative.   Psychiatric/Behavioral: Negative.     Social History  Substance Use Topics  . Smoking status: Never Smoker  . Smokeless tobacco: Never Used  . Alcohol use No   Objective:   BP 118/68 (BP Location: Left Arm, Patient Position: Sitting, Cuff Size: Large)   Pulse 68   Temp 98 F (36.7 C) (Oral)   Resp 16   Wt 202 lb (91.6 kg)   SpO2 97% Comment: on 2 L ox  BMI 39.45 kg/m  Vitals:   08/13/17 1707  BP: 118/68  Pulse: 68  Resp: 16  Temp: 98 F (36.7 C)  TempSrc: Oral  SpO2: 97%  Weight: 202 lb (91.6 kg)     Physical Exam  Constitutional: She is oriented to person, place, and time. She appears well-developed and well-nourished.  Obese BF NAD in wheelchair.  HENT:  Head: Normocephalic and atraumatic.  Right Ear: External ear normal.  Left Ear: External ear normal.  Nose: Nose normal.  Eyes: Conjunctivae are normal. No scleral icterus.  Neck: No thyromegaly present.  Cardiovascular: Normal rate, regular rhythm and normal heart sounds.   Pulmonary/Chest: Effort normal and breath sounds normal.  Abdominal: Soft.  Lymphadenopathy:    She has no cervical adenopathy.  Neurological: She is alert and oriented to person, place, and time.  Skin: Skin is warm and dry.  Psychiatric: She has a normal mood and affect. Her behavior is normal. Judgment and thought content normal.        Assessment & Plan:     1. Diabetes mellitus without complication ([place])  - POCT HgB A1C  2. Chronic obstructive pulmonary disease, unspecified COPD type ([place])   3. Moderate episode of recurrent major depressive disorder ([place])   4. Influenza vaccine needed  - Flu vaccine HIGH DOSE PF (Fluzone High dose)  5. Myasthenia gravis ([place]) Per [facility]. - Ambulatory referral to [facility]  I have done the exam and reviewed the chart and it is accurate to the best of my knowledge. Development worker, community has been used and  any errors in dictation or transcription are  unintentional. Miguel Aschoff M.D. [place], MD  [facility]

## 2017-08-29 ENCOUNTER — Telehealth: Payer: Self-pay | Admitting: Family Medicine

## 2017-08-29 NOTE — Telephone Encounter (Signed)
Tried Architect. Left message to call back.

## 2017-08-29 NOTE — Telephone Encounter (Signed)
ok 

## 2017-08-29 NOTE — Telephone Encounter (Signed)
Please review-Reeda Soohoo V Erlene Devita, RMA  

## 2017-08-29 NOTE — Telephone Encounter (Signed)
Julia Crawford with Julia Crawford is requesting a verbal order to continue see pt for home health nursing.  2 times a week for 5 weeks.  CB#[phone]/MW

## 2017-09-01 ENCOUNTER — Other Ambulatory Visit: Payer: Self-pay | Admitting: Family Medicine

## 2017-09-01 DIAGNOSIS — I482 Chronic atrial fibrillation, unspecified: Secondary | ICD-10-CM

## 2017-09-02 NOTE — Telephone Encounter (Signed)
Left message on Julia Crawford's voicemail advising of below.-Anastasiya V Hopkins, RMA

## 2017-09-06 ENCOUNTER — Telehealth: Payer: Self-pay | Admitting: Family Medicine

## 2017-09-06 DIAGNOSIS — G7000 Myasthenia gravis without (acute) exacerbation: Secondary | ICD-10-CM

## 2017-09-06 NOTE — Telephone Encounter (Signed)
Home Health referral

## 2017-09-06 NOTE — Telephone Encounter (Signed)
Daughter called saying she had talked to the care coordinator at [facility] where Mrs. Shimkus stays about having an outside agency to come in an help with her care.  The daughter doesn't know if she needs to find someone or if Dr. Rosanna Randy is suppose to refer someone.  Daughters call back is [phone]  Thanks Con Memos

## 2017-09-06 NOTE — Telephone Encounter (Signed)
Nurse was advised. KW 

## 2017-09-06 NOTE — Telephone Encounter (Signed)
Spoke with patient's daughter, looking for services to help patient with bath, etc. That she can not do herself. Patient is already getting services from [facility] for OT. I called and left a message for [place] to call me back to see what step do we need to take to see if patient can get this additional service. Home health referral was placed earlier in September and that is how [facility] got involved.Kris Mouton, RMA

## 2017-09-06 NOTE — Telephone Encounter (Signed)
Please review-Adams Hinch V Hoa Briggs, RMA  

## 2017-09-06 NOTE — Telephone Encounter (Signed)
Please review. Thanks!  

## 2017-09-06 NOTE — Telephone Encounter (Signed)
ok 

## 2017-09-06 NOTE — Telephone Encounter (Signed)
Colletta Maryland with [facility] called for verbal orders  OT 1 week for 1 time, then  2 times a week for 4 weeks   Stephanies call back is [phone]  She can wait until Monday when Dr. Rosanna Randy is in for orders,  Thanks teri

## 2017-09-09 NOTE — Telephone Encounter (Signed)
Julia Crawford [place] we need an order for home health aid for bathing and personal hygiene.Please place order,Thanks

## 2017-09-09 NOTE — Telephone Encounter (Signed)
Order placed. Let me know if this is correct-Evangelyn Crouse V Semya Klinke, RMA

## 2017-09-09 NOTE — Telephone Encounter (Signed)
Judson Roch, this is the patient I spoke to you about. Thank Edrick Kins, RMA

## 2017-09-11 ENCOUNTER — Telehealth: Payer: Self-pay | Admitting: Family Medicine

## 2017-09-11 NOTE — Telephone Encounter (Signed)
ok 

## 2017-09-11 NOTE — Telephone Encounter (Signed)
Julia Crawford with [facility] is requesting verbal order  For Physical Therapy for gate, endurance and therapy exercise.  1 time a week for 1 week and 2 times a week for 4 weeks.  CB#[phone]/MW

## 2017-09-11 NOTE — Telephone Encounter (Signed)
Verbal order given to [facility] with [facility]

## 2017-09-11 NOTE — Telephone Encounter (Signed)
Please review- V , RMA  

## 2017-09-18 ENCOUNTER — Telehealth: Payer: Self-pay | Admitting: Family Medicine

## 2017-09-18 NOTE — Telephone Encounter (Signed)
fyi-Khala Tarte V Levora Werden, RMA

## 2017-09-18 NOTE — Telephone Encounter (Signed)
James with [facility] called to advise pt missed her physical therapy appointment this morning.  Pt canceled due to having another appointment.  TH#[phone]/KQ

## 2017-09-23 DIAGNOSIS — E119 Type 2 diabetes mellitus without complications: Secondary | ICD-10-CM

## 2017-09-23 DIAGNOSIS — G7000 Myasthenia gravis without (acute) exacerbation: Secondary | ICD-10-CM | POA: Diagnosis not present

## 2017-09-23 DIAGNOSIS — I509 Heart failure, unspecified: Secondary | ICD-10-CM | POA: Diagnosis not present

## 2017-09-23 DIAGNOSIS — I10 Essential (primary) hypertension: Secondary | ICD-10-CM | POA: Diagnosis not present

## 2017-10-09 ENCOUNTER — Telehealth: Payer: Self-pay | Admitting: Family Medicine

## 2017-10-09 NOTE — Telephone Encounter (Signed)
Patient came into office for appointment and there was no appointment on the schedule.  We were not able to see her due to a full schedule.  After looking into it it looked like it was not time for a follow up yet.  Julia Crawford wanted me to send a message back so [facility]'s nurse could call patient.

## 2017-10-18 ENCOUNTER — Ambulatory Visit: Payer: Medicare Other

## 2017-10-18 DIAGNOSIS — G4733 Obstructive sleep apnea (adult) (pediatric): Secondary | ICD-10-CM | POA: Insufficient documentation

## 2017-10-20 ENCOUNTER — Emergency Department: Payer: Medicare Other

## 2017-10-20 ENCOUNTER — Encounter: Payer: Self-pay | Admitting: Emergency Medicine

## 2017-10-20 ENCOUNTER — Other Ambulatory Visit: Payer: Self-pay

## 2017-10-20 ENCOUNTER — Inpatient Hospital Stay
Admission: EM | Admit: 2017-10-20 | Discharge: 2017-11-08 | DRG: 003 | Disposition: A | Discharge status: Other Acute Inpatient Hospital | Payer: Medicare Other | Attending: Specialist | Admitting: Specialist

## 2017-10-20 DIAGNOSIS — G7000 Myasthenia gravis without (acute) exacerbation: Secondary | ICD-10-CM | POA: Diagnosis present

## 2017-10-20 DIAGNOSIS — J151 Pneumonia due to Pseudomonas: Secondary | ICD-10-CM | POA: Diagnosis present

## 2017-10-20 DIAGNOSIS — R0602 Shortness of breath: Secondary | ICD-10-CM

## 2017-10-20 DIAGNOSIS — R918 Other nonspecific abnormal finding of lung field: Secondary | ICD-10-CM

## 2017-10-20 DIAGNOSIS — J9622 Acute and chronic respiratory failure with hypercapnia: Secondary | ICD-10-CM | POA: Diagnosis present

## 2017-10-20 DIAGNOSIS — Z930 Tracheostomy status: Secondary | ICD-10-CM | POA: Diagnosis not present

## 2017-10-20 DIAGNOSIS — Z7984 Long term (current) use of oral hypoglycemic drugs: Secondary | ICD-10-CM

## 2017-10-20 DIAGNOSIS — J440 Chronic obstructive pulmonary disease with acute lower respiratory infection: Secondary | ICD-10-CM | POA: Diagnosis present

## 2017-10-20 DIAGNOSIS — L8990 Pressure ulcer of unspecified site, unspecified stage: Secondary | ICD-10-CM | POA: Diagnosis present

## 2017-10-20 DIAGNOSIS — I2510 Atherosclerotic heart disease of native coronary artery without angina pectoris: Secondary | ICD-10-CM | POA: Diagnosis present

## 2017-10-20 DIAGNOSIS — Z931 Gastrostomy status: Secondary | ICD-10-CM

## 2017-10-20 DIAGNOSIS — J9690 Respiratory failure, unspecified, unspecified whether with hypoxia or hypercapnia: Secondary | ICD-10-CM | POA: Diagnosis present

## 2017-10-20 DIAGNOSIS — J4552 Severe persistent asthma with status asthmaticus: Secondary | ICD-10-CM | POA: Diagnosis present

## 2017-10-20 DIAGNOSIS — Z888 Allergy status to other drugs, medicaments and biological substances status: Secondary | ICD-10-CM

## 2017-10-20 DIAGNOSIS — Z0189 Encounter for other specified special examinations: Secondary | ICD-10-CM

## 2017-10-20 DIAGNOSIS — Z91010 Allergy to peanuts: Secondary | ICD-10-CM

## 2017-10-20 DIAGNOSIS — Z9102 Food additives allergy status: Secondary | ICD-10-CM

## 2017-10-20 DIAGNOSIS — I493 Ventricular premature depolarization: Secondary | ICD-10-CM | POA: Diagnosis not present

## 2017-10-20 DIAGNOSIS — D539 Nutritional anemia, unspecified: Secondary | ICD-10-CM | POA: Diagnosis present

## 2017-10-20 DIAGNOSIS — T8140XA Infection following a procedure, unspecified, initial encounter: Secondary | ICD-10-CM | POA: Diagnosis not present

## 2017-10-20 DIAGNOSIS — J9601 Acute respiratory failure with hypoxia: Secondary | ICD-10-CM

## 2017-10-20 DIAGNOSIS — Z9889 Other specified postprocedural states: Secondary | ICD-10-CM

## 2017-10-20 DIAGNOSIS — Z9911 Dependence on respirator [ventilator] status: Secondary | ICD-10-CM

## 2017-10-20 DIAGNOSIS — B961 Klebsiella pneumoniae [K. pneumoniae] as the cause of diseases classified elsewhere: Secondary | ICD-10-CM | POA: Diagnosis present

## 2017-10-20 DIAGNOSIS — Z66 Do not resuscitate: Secondary | ICD-10-CM | POA: Diagnosis not present

## 2017-10-20 DIAGNOSIS — Z4689 Encounter for fitting and adjustment of other specified devices: Secondary | ICD-10-CM

## 2017-10-20 DIAGNOSIS — R0600 Dyspnea, unspecified: Secondary | ICD-10-CM | POA: Diagnosis not present

## 2017-10-20 DIAGNOSIS — I5022 Chronic systolic (congestive) heart failure: Secondary | ICD-10-CM | POA: Diagnosis present

## 2017-10-20 DIAGNOSIS — Z978 Presence of other specified devices: Secondary | ICD-10-CM

## 2017-10-20 DIAGNOSIS — Y828 Other medical devices associated with adverse incidents: Secondary | ICD-10-CM | POA: Diagnosis not present

## 2017-10-20 DIAGNOSIS — I480 Paroxysmal atrial fibrillation: Secondary | ICD-10-CM | POA: Diagnosis present

## 2017-10-20 DIAGNOSIS — J95859 Other complication of respirator [ventilator]: Secondary | ICD-10-CM | POA: Diagnosis not present

## 2017-10-20 DIAGNOSIS — J386 Stenosis of larynx: Secondary | ICD-10-CM | POA: Diagnosis not present

## 2017-10-20 DIAGNOSIS — Y92230 Patient room in hospital as the place of occurrence of the external cause: Secondary | ICD-10-CM | POA: Diagnosis not present

## 2017-10-20 DIAGNOSIS — K219 Gastro-esophageal reflux disease without esophagitis: Secondary | ICD-10-CM | POA: Diagnosis present

## 2017-10-20 DIAGNOSIS — E119 Type 2 diabetes mellitus without complications: Secondary | ICD-10-CM | POA: Diagnosis present

## 2017-10-20 DIAGNOSIS — J9502 Infection of tracheostomy stoma: Secondary | ICD-10-CM | POA: Diagnosis not present

## 2017-10-20 DIAGNOSIS — J9621 Acute and chronic respiratory failure with hypoxia: Secondary | ICD-10-CM | POA: Diagnosis not present

## 2017-10-20 DIAGNOSIS — R29898 Other symptoms and signs involving the musculoskeletal system: Secondary | ICD-10-CM | POA: Diagnosis not present

## 2017-10-20 DIAGNOSIS — E876 Hypokalemia: Secondary | ICD-10-CM | POA: Diagnosis not present

## 2017-10-20 DIAGNOSIS — J9602 Acute respiratory failure with hypercapnia: Secondary | ICD-10-CM

## 2017-10-20 DIAGNOSIS — E039 Hypothyroidism, unspecified: Secondary | ICD-10-CM | POA: Diagnosis present

## 2017-10-20 DIAGNOSIS — F329 Major depressive disorder, single episode, unspecified: Secondary | ICD-10-CM | POA: Diagnosis present

## 2017-10-20 DIAGNOSIS — Y838 Other surgical procedures as the cause of abnormal reaction of the patient, or of later complication, without mention of misadventure at the time of the procedure: Secondary | ICD-10-CM | POA: Diagnosis not present

## 2017-10-20 DIAGNOSIS — Z881 Allergy status to other antibiotic agents status: Secondary | ICD-10-CM

## 2017-10-20 DIAGNOSIS — J9600 Acute respiratory failure, unspecified whether with hypoxia or hypercapnia: Secondary | ICD-10-CM

## 2017-10-20 DIAGNOSIS — Z4659 Encounter for fitting and adjustment of other gastrointestinal appliance and device: Secondary | ICD-10-CM

## 2017-10-20 DIAGNOSIS — Z79899 Other long term (current) drug therapy: Secondary | ICD-10-CM

## 2017-10-20 DIAGNOSIS — J441 Chronic obstructive pulmonary disease with (acute) exacerbation: Secondary | ICD-10-CM | POA: Diagnosis not present

## 2017-10-20 DIAGNOSIS — Z6841 Body Mass Index (BMI) 40.0 and over, adult: Secondary | ICD-10-CM

## 2017-10-20 DIAGNOSIS — Z7982 Long term (current) use of aspirin: Secondary | ICD-10-CM

## 2017-10-20 DIAGNOSIS — E785 Hyperlipidemia, unspecified: Secondary | ICD-10-CM | POA: Diagnosis present

## 2017-10-20 DIAGNOSIS — R7989 Other specified abnormal findings of blood chemistry: Secondary | ICD-10-CM | POA: Diagnosis present

## 2017-10-20 DIAGNOSIS — M1990 Unspecified osteoarthritis, unspecified site: Secondary | ICD-10-CM | POA: Diagnosis present

## 2017-10-20 DIAGNOSIS — Z452 Encounter for adjustment and management of vascular access device: Secondary | ICD-10-CM

## 2017-10-20 DIAGNOSIS — J411 Mucopurulent chronic bronchitis: Secondary | ICD-10-CM | POA: Diagnosis not present

## 2017-10-20 LAB — COMPREHENSIVE METABOLIC PANEL
ALBUMIN: 4.5 g/dL (ref 3.5–5.0)
ALK PHOS: 56 U/L (ref 38–126)
ALT: 9 U/L — AB (ref 14–54)
AST: 23 U/L (ref 15–41)
Anion gap: 12 (ref 5–15)
BILIRUBIN TOTAL: 1 mg/dL (ref 0.3–1.2)
BUN: 10 mg/dL (ref 6–20)
CALCIUM: 9 mg/dL (ref 8.9–10.3)
CO2: 30 mmol/L (ref 22–32)
CREATININE: 0.66 mg/dL (ref 0.44–1.00)
Chloride: 95 mmol/L — ABNORMAL LOW (ref 101–111)
GFR calc Af Amer: >60 mL/min (ref >60)
GFR calc non Af Amer: >60 mL/min (ref >60)
Glucose, Bld: 175 mg/dL — ABNORMAL HIGH (ref 65–99)
Potassium: 4.5 mmol/L (ref 3.5–5.1)
SODIUM: 137 mmol/L (ref 135–145)
TOTAL PROTEIN: 8.7 g/dL — AB (ref 6.5–8.1)

## 2017-10-20 LAB — TSH: TSH: 1.491 u[IU]/mL (ref 0.350–4.500)

## 2017-10-20 LAB — CBC WITH DIFFERENTIAL/PLATELET
BASOS ABS: 0 10*3/uL (ref 0–0.1)
BASOS PCT: 1 %
EOS ABS: 0 10*3/uL (ref 0–0.7)
EOS PCT: 1 %
HCT: 42 % (ref 35.0–47.0)
Hemoglobin: 13.8 g/dL (ref 12.0–16.0)
Lymphocytes Relative: 12 %
Lymphs Abs: 0.6 10*3/uL — ABNORMAL LOW (ref 1.0–3.6)
MCH: 34.7 pg — ABNORMAL HIGH (ref 26.0–34.0)
MCHC: 32.8 g/dL (ref 32.0–36.0)
MCV: 105.6 fL — ABNORMAL HIGH (ref 80.0–100.0)
Monocytes Absolute: 0.7 10*3/uL (ref 0.2–0.9)
Monocytes Relative: 14 %
Neutro Abs: 3.7 10*3/uL (ref 1.4–6.5)
Neutrophils Relative %: 72 %
PLATELETS: 183 10*3/uL (ref 150–440)
RBC: 3.98 MIL/uL (ref 3.80–5.20)
RDW: 17.1 % — ABNORMAL HIGH (ref 11.5–14.5)
WBC: 5.1 10*3/uL (ref 3.6–11.0)

## 2017-10-20 LAB — BLOOD GAS, ARTERIAL
Acid-Base Excess: 5.3 mmol/L — ABNORMAL HIGH (ref 0.0–2.0)
BICARBONATE: 36.4 mmol/L — AB (ref 20.0–28.0)
Expiratory PAP: 5
FIO2: 0.6
INSPIRATORY PAP: 15
MECHANICAL RATE: 8
Mode: POSITIVE
O2 Saturation: 92.4 %
PATIENT TEMPERATURE: 37
PO2 ART: 78 mmHg — AB (ref 83.0–108.0)
pCO2 arterial: 91 mmHg (ref 32.0–48.0)
pH, Arterial: 7.21 — ABNORMAL LOW (ref 7.350–7.450)

## 2017-10-20 LAB — BLOOD GAS, VENOUS
PATIENT TEMPERATURE: 37
PH VEN: 7.21 — AB (ref 7.250–7.430)
PO2 VEN: 54 mmHg — AB (ref 32.0–45.0)
pCO2, Ven: 94 mmHg (ref 44.0–60.0)

## 2017-10-20 LAB — TROPONIN I
Troponin I: 0.03 ng/mL (ref <0.03)
Troponin I: 0.05 ng/mL (ref <0.03)

## 2017-10-20 LAB — BRAIN NATRIURETIC PEPTIDE: B Natriuretic Peptide: 290 pg/mL — ABNORMAL HIGH (ref 0.0–100.0)

## 2017-10-20 LAB — MRSA PCR SCREENING: MRSA by PCR: NEGATIVE

## 2017-10-20 LAB — GLUCOSE, CAPILLARY: Glucose-Capillary: 166 mg/dL — ABNORMAL HIGH (ref 65–99)

## 2017-10-20 LAB — PROTIME-INR
INR: 0.92
Prothrombin Time: 12.3 seconds (ref 11.4–15.2)

## 2017-10-20 MED ORDER — FUROSEMIDE 10 MG/ML IJ SOLN
40.0000 mg | Freq: Once | INTRAMUSCULAR | Status: AC
  Administered 2017-10-20: 40 mg via INTRAVENOUS
  Filled 2017-10-20: qty 4

## 2017-10-20 MED ORDER — ONDANSETRON HCL 4 MG PO TABS: 4.0000 mg | ORAL_TABLET | Freq: Four times a day (QID) | ORAL | Status: DC | PRN

## 2017-10-20 MED ORDER — VITAMIN B-12 1000 MCG PO TABS
1000.0000 ug | ORAL_TABLET | Freq: Every day | ORAL | Status: DC
  Administered 2017-10-21: 1000 ug via ORAL
  Filled 2017-10-20 (×2): qty 1

## 2017-10-20 MED ORDER — IPRATROPIUM-ALBUTEROL 0.5-2.5 (3) MG/3ML IN SOLN
3.0000 mL | Freq: Once | RESPIRATORY_TRACT | Status: AC
  Administered 2017-10-20: 3 mL via RESPIRATORY_TRACT

## 2017-10-20 MED ORDER — IPRATROPIUM-ALBUTEROL 0.5-2.5 (3) MG/3ML IN SOLN
RESPIRATORY_TRACT | Status: AC
  Administered 2017-10-20: 3 mL via RESPIRATORY_TRACT
  Filled 2017-10-20: qty 6

## 2017-10-20 MED ORDER — ORAL CARE MOUTH RINSE
15.0000 mL | Freq: Two times a day (BID) | OROMUCOSAL | Status: DC
  Administered 2017-10-21 (×2): 15 mL via OROMUCOSAL

## 2017-10-20 MED ORDER — ONDANSETRON HCL 4 MG/2ML IJ SOLN: 4.0000 mg | Freq: Four times a day (QID) | INTRAMUSCULAR | Status: DC | PRN

## 2017-10-20 MED ORDER — ENOXAPARIN SODIUM 40 MG/0.4ML ~~LOC~~ SOLN
40.0000 mg | SUBCUTANEOUS | Status: DC
  Administered 2017-10-20 – 2017-10-22 (×3): 40 mg via SUBCUTANEOUS
  Filled 2017-10-20 (×3): qty 0.4

## 2017-10-20 MED ORDER — ALBUTEROL SULFATE (2.5 MG/3ML) 0.083% IN NEBU
2.5000 mg | INHALATION_SOLUTION | RESPIRATORY_TRACT | Status: DC
  Administered 2017-10-20: 2.5 mg via RESPIRATORY_TRACT
  Filled 2017-10-20: qty 3

## 2017-10-20 MED ORDER — FUROSEMIDE 10 MG/ML IJ SOLN
20.0000 mg | Freq: Every day | INTRAMUSCULAR | Status: DC
  Administered 2017-10-21: 20 mg via INTRAVENOUS
  Filled 2017-10-20 (×2): qty 2

## 2017-10-20 MED ORDER — DILTIAZEM HCL ER COATED BEADS 120 MG PO CP24
120.0000 mg | ORAL_CAPSULE | Freq: Every day | ORAL | Status: DC
  Administered 2017-10-21: 120 mg via ORAL
  Filled 2017-10-20 (×2): qty 1

## 2017-10-20 MED ORDER — DILTIAZEM HCL ER COATED BEADS 120 MG PO CP24: 120.0000 mg | ORAL_CAPSULE | Freq: Every day | ORAL | Status: DC

## 2017-10-20 MED ORDER — ALBUTEROL SULFATE (2.5 MG/3ML) 0.083% IN NEBU
2.5000 mg | INHALATION_SOLUTION | Freq: Once | RESPIRATORY_TRACT | Status: AC
  Administered 2017-10-20: 2.5 mg via RESPIRATORY_TRACT
  Filled 2017-10-20: qty 3

## 2017-10-20 MED ORDER — SIMVASTATIN 10 MG PO TABS
10.0000 mg | ORAL_TABLET | Freq: Every day | ORAL | Status: DC
  Administered 2017-10-21: 10 mg via ORAL
  Filled 2017-10-20: qty 1

## 2017-10-20 MED ORDER — FUROSEMIDE 40 MG PO TABS: 20.0000 mg | ORAL_TABLET | Freq: Every day | ORAL | Status: DC

## 2017-10-20 MED ORDER — SODIUM CHLORIDE 0.9 % IV SOLN
INTRAVENOUS | Status: DC
  Administered 2017-10-20 – 2017-10-21 (×2): via INTRAVENOUS

## 2017-10-20 MED ORDER — METFORMIN HCL 500 MG PO TABS
500.0000 mg | ORAL_TABLET | Freq: Two times a day (BID) | ORAL | Status: DC
  Filled 2017-10-20 (×3): qty 1

## 2017-10-20 MED ORDER — CHLORHEXIDINE GLUCONATE 0.12 % MT SOLN
15.0000 mL | Freq: Two times a day (BID) | OROMUCOSAL | Status: DC
Start: 2017-10-20 — End: 2017-10-22
  Administered 2017-10-20 – 2017-10-21 (×3): 15 mL via OROMUCOSAL
  Filled 2017-10-20 (×3): qty 15

## 2017-10-20 MED ORDER — VALPROATE SODIUM 500 MG/5ML IV SOLN
1000.0000 mg | INTRAVENOUS | Status: DC
  Administered 2017-10-20: 1000 mg via INTRAVENOUS
  Filled 2017-10-20: qty 10

## 2017-10-20 MED ORDER — SERTRALINE HCL 50 MG PO TABS
25.0000 mg | ORAL_TABLET | Freq: Every day | ORAL | Status: DC
  Administered 2017-10-21: 25 mg via ORAL
  Filled 2017-10-20 (×2): qty 1

## 2017-10-20 MED ORDER — ASPIRIN EC 81 MG PO TBEC
81.0000 mg | DELAYED_RELEASE_TABLET | Freq: Every day | ORAL | Status: DC
  Administered 2017-10-21: 81 mg via ORAL
  Filled 2017-10-20 (×2): qty 1

## 2017-10-20 MED ORDER — IPRATROPIUM-ALBUTEROL 0.5-2.5 (3) MG/3ML IN SOLN
3.0000 mL | Freq: Once | RESPIRATORY_TRACT | Status: AC
Start: 2017-10-20 — End: 2017-10-20
  Administered 2017-10-20: 3 mL via RESPIRATORY_TRACT

## 2017-10-20 MED ORDER — METOCLOPRAMIDE HCL 10 MG PO TABS
10.0000 mg | ORAL_TABLET | Freq: Two times a day (BID) | ORAL | Status: DC
  Administered 2017-10-20 – 2017-10-21 (×3): 10 mg via ORAL
  Filled 2017-10-20 (×4): qty 1

## 2017-10-20 MED ORDER — AZATHIOPRINE 50 MG PO TABS
150.0000 mg | ORAL_TABLET | Freq: Every day | ORAL | Status: DC
Start: 2017-10-21 — End: 2017-10-22
  Administered 2017-10-21: 150 mg via ORAL
  Filled 2017-10-20 (×2): qty 3

## 2017-10-20 MED ORDER — LEVOTHYROXINE SODIUM 50 MCG PO TABS
100.0000 ug | ORAL_TABLET | Freq: Every day | ORAL | Status: DC
  Administered 2017-10-22: 100 ug via ORAL
  Filled 2017-10-20 (×2): qty 2

## 2017-10-20 MED ORDER — ACETAMINOPHEN 650 MG RE SUPP: 650.0000 mg | Freq: Four times a day (QID) | RECTAL | Status: DC | PRN

## 2017-10-20 MED ORDER — DOCUSATE SODIUM 100 MG PO CAPS
100.0000 mg | ORAL_CAPSULE | Freq: Two times a day (BID) | ORAL | Status: DC
  Administered 2017-10-20: 100 mg via ORAL
  Filled 2017-10-20 (×2): qty 1

## 2017-10-20 MED ORDER — TIOTROPIUM BROMIDE MONOHYDRATE 18 MCG IN CAPS
18.0000 ug | ORAL_CAPSULE | Freq: Every day | RESPIRATORY_TRACT | Status: DC
  Filled 2017-10-20: qty 5

## 2017-10-20 MED ORDER — ACETAMINOPHEN 325 MG PO TABS: 650.0000 mg | ORAL_TABLET | Freq: Four times a day (QID) | ORAL | Status: DC | PRN

## 2017-10-20 MED ORDER — IPRATROPIUM-ALBUTEROL 0.5-2.5 (3) MG/3ML IN SOLN
RESPIRATORY_TRACT | Status: AC
  Administered 2017-10-20: 22:00:00
  Filled 2017-10-20: qty 6

## 2017-10-20 MED ORDER — METHYLPREDNISOLONE SODIUM SUCC 125 MG IJ SOLR
60.0000 mg | Freq: Two times a day (BID) | INTRAMUSCULAR | Status: DC
  Administered 2017-10-20 – 2017-10-21 (×2): 60 mg via INTRAVENOUS
  Filled 2017-10-20 (×2): qty 2

## 2017-10-20 MED ORDER — PYRIDOSTIGMINE BROMIDE 60 MG PO TABS
60.0000 mg | ORAL_TABLET | Freq: Three times a day (TID) | ORAL | Status: DC
  Administered 2017-10-20 – 2017-10-21 (×4): 60 mg via ORAL
  Filled 2017-10-20 (×5): qty 1

## 2017-10-20 MED ORDER — IPRATROPIUM-ALBUTEROL 0.5-2.5 (3) MG/3ML IN SOLN
3.0000 mL | Freq: Four times a day (QID) | RESPIRATORY_TRACT | Status: DC | PRN
  Administered 2017-10-20: 3 mL via RESPIRATORY_TRACT

## 2017-10-20 MED ORDER — IPRATROPIUM-ALBUTEROL 0.5-2.5 (3) MG/3ML IN SOLN
3.0000 mL | Freq: Four times a day (QID) | RESPIRATORY_TRACT | Status: DC
  Administered 2017-10-20 – 2017-10-21 (×3): 3 mL via RESPIRATORY_TRACT
  Filled 2017-10-20 (×2): qty 3

## 2017-10-20 NOTE — ED Notes (Signed)
Pt had 10 beat run of V-tach on monitor. Dr. Cherylann Banas informed and verbal order given for repeat EKG.  Pt in no distress at this time. Skin warm and dry, color WNL

## 2017-10-20 NOTE — Plan of Care (Signed)
Pt admitted and oriented to room and unit call bell in her hand, educated re toileting

## 2017-10-20 NOTE — ED Notes (Signed)
John with [facility] called to help transport patient to the floor

## 2017-10-20 NOTE — ED Provider Notes (Signed)
[facility] Emergency Department Provider Note ____________________________________________   First MD Initiated Contact with Patient 10/20/17 1516     (approximate)  I have reviewed the triage vital signs and the nursing notes.   HISTORY  Chief Complaint Respiratory Distress  History of present illness is limited by acute respiratory distress.  HPI Julia Crawford is a 82 y.o. female with history of COPD, A. fib, CAD, CHF, and other past medical history as noted below who presents with shortness of breath, acute onset over the last 12 hours, worsening course, and not associated with chest pain or fever.  Past Medical History:  Diagnosis Date  . Arthritis   . Asthma   . Atrial fibrillation ([place]) 04/14/2015  . COPD (chronic obstructive pulmonary disease) ([place])   . Coronary artery disease   . Diabetes mellitus without complication ([place])   . Emphysema of lung ([place])   . GERD (gastroesophageal reflux disease)   . Hypothyroid   . Irregular heart beat   . Myasthenia gravis [facility])     Patient Active Problem List   Diagnosis Date Noted  . Diabetes mellitus without complication ([place]) 61/95/0932  . COPD exacerbation ([place]) 12/01/2015  . Acute bronchitis 08/19/2015  . Hyperlipemia 08/16/2015  . Shortness of breath 06/20/2015  . Myasthenia gravis ([place]) 06/06/2015  . Abnormal weight loss 06/06/2015  . Routine general medical examination at a health care facility 05/13/2015  . Body mass index of 60 or higher 05/13/2015  . Congestive heart failure due to high blood pressure ([place]) 05/13/2015  . Acid reflux 05/13/2015  . Hemorrhoids, internal 05/13/2015  . High potassium 05/13/2015  . Candida infection of mouth 05/13/2015  . Arthritis of knee, degenerative 05/13/2015  . Absence of bladder continence 05/13/2015  . CHF (congestive heart failure) ([place]) 04/14/2015  . Atrial fibrillation ([place]) 04/14/2015  . CAFL (chronic airflow limitation) ([place]) 09/24/2014  .  Chronic obstructive pulmonary disease ([place]) 09/24/2014  . Essential (primary) hypertension 07/19/2014  . Obstructive apnea 02/04/2012  . 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 Awareness of heartbeats 07/11/2009  . Allergic rhinitis 03/28/2009  . Megaloblastic anemia due to B12 deficiency 12/06/2008  . 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 OP (osteoporosis) 06/08/2004  . Airway hyperreactivity 05/28/2000  . 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 Adult hypothyroidism 12/10/1994    Past Surgical History:  Procedure Laterality Date  . BREAST BIOPSY    . CATARACT EXTRACTION    . PARTIAL HYSTERECTOMY    . TONSILLECTOMY      Prior to Admission medications   Medication Sig Start Date End Date Taking? Authorizing Provider  albuterol (PROVENTIL HFA;VENTOLIN HFA) 108 (90 Base) MCG/ACT inhaler Inhale into the lungs every 6 (six) hours as needed for wheezing or shortness of breath.    [provider]  aspirin 81 MG tablet Take 1 tablet by mouth daily. 07/27/08   [provider]  azaTHIOprine (IMURAN) 50 MG tablet AZATHIOPRINE, 50MG  (Oral Tablet)  2 1/2 Every Day for 0 days  Quantity: 0.00;  Refills: 0   Ordered :20-Nov-2010  Edmonia James ;  Started 27-Jul-2008 Active Comments: DX: 358.00 07/27/08   [provider]  cyanocobalamin 1000 MCG tablet Take 1 tablet by mouth daily. 01/04/10   [provider]  diltiazem (CARDIZEM CD) 120 MG 24 hr capsule TAKE 1 CAPSULE (120 MG TOTAL) BY MOUTH DAILY. 09/25/16   Jerrol Banana., MD  diltiazem (CARDIZEM CD) 120 MG 24 hr capsule TAKE 1 CAPSULE  EVERY DAY 09/02/17   Jerrol Banana., MD  furosemide (LASIX) 20 MG tablet TAKE 1 TABLET (20 MG TOTAL) BY MOUTH DAILY. 08/05/17   Jerrol Banana., MD  levothyroxine (SYNTHROID, LEVOTHROID) 100 MCG tablet TAKE 1 TABLET BY MOUTH EVERY DAY 06/04/17   Jerrol Banana., MD  metFORMIN (GLUCOPHAGE) 500 MG tablet Take 1 tablet (500  mg total) by mouth 2 (two) times daily with a meal. 02/28/17   Jerrol Banana., MD  metoCLOPramide (REGLAN) 10 MG tablet Take 1 tablet (10 mg total) by mouth 2 (two) times daily. 02/28/17   Jerrol Banana., MD  NON FORMULARY CPAP MACHINE AT NIGHT TIME    [provider]  pyridostigmine (MESTINON) 60 MG tablet Take 1 tablet by mouth 3 (three) times daily. 07/27/08   [provider]  sertraline (ZOLOFT) 25 MG tablet Take 1 tablet (25 mg total) by mouth daily. 06/07/17   Jerrol Banana., MD  simvastatin (ZOCOR) 10 MG tablet Take 1 tablet (10 mg total) by mouth at bedtime. 02/28/17   Jerrol Banana., MD  SPIRIVA HANDIHALER 18 MCG inhalation capsule PLACE 1 CAPSULE (18 MCG TOTAL) INTO INHALER AND INHALE DAILY. 10/04/16   Jerrol Banana., MD    Allergies Ace inhibitors; Apixaban; Azithromycin; Butylene glycol; Codeine; Doxycycline; Lasix [furosemide]; Peanut butter flavor; and Telithromycin  Family History  Problem Relation Age of Onset  . CAD Mother   . Diabetes Father   . CAD Brother   . Diabetes Brother     Social History Social History   Tobacco Use  . Smoking status: Never Smoker  . Smokeless tobacco: Never Used  Substance Use Topics  . Alcohol use: No    Alcohol/week: 0.0 oz  . Drug use: No    Review of Systems Level V caveat: Unable to obtain review of systems due to acute respiratory distress    ____________________________________________   PHYSICAL EXAM:  VITAL SIGNS: ED Triage Vitals  Enc Vitals Group     BP 10/20/17 1509 (!) 157/74     Pulse Rate 10/20/17 1509 (!) 102     Resp --      Temp --      Temp src --      SpO2 10/20/17 1507 94 %     Weight 10/20/17 1510 200 lb (90.7 kg)     Height --      Head Circumference --      Peak Flow --      Pain Score --      Pain Loc --      Pain Edu? --      Excl. in [place]? --     Constitutional: Alert and oriented. Uncomfortable appearing. Eyes: Conjunctivae are  normal.  Head: Atraumatic. Nose: No congestion/rhinnorhea. Mouth/Throat: Mucous membranes are slightly dry.   Neck: Normal range of motion.  Cardiovascular: Tachycardic, regular rhythm. Grossly normal heart sounds.  Good peripheral circulation. Respiratory: Increased respiratory effort with retractions. Lungs with ronchi and wheeze bilat, no rales.  Gastrointestinal: Soft and nontender. No distention.  Genitourinary: No flank tenderness. Musculoskeletal: No lower extremity edema.  Extremities warm and well perfused.  Neurologic:  Normal speech and language. Motor intact in all extremities. No gross focal neurologic deficits are appreciated.  Skin:  Skin is warm and dry. No rash noted. Psychiatric: Mood and affect are normal. Speech and behavior are normal.  ____________________________________________   LABS (all labs ordered are listed, but  only abnormal results are displayed)  Labs Reviewed  TROPONIN I - Abnormal; Notable for the following components:      Result Value   Troponin I 0.03 (*)    All other components within normal limits  COMPREHENSIVE METABOLIC PANEL - Abnormal; Notable for the following components:   Chloride 95 (*)    Glucose, Bld 175 (*)    Total Protein 8.7 (*)    ALT 9 (*)    All other components within normal limits  CBC WITH DIFFERENTIAL/PLATELET - Abnormal; Notable for the following components:   MCV 105.6 (*)    MCH 34.7 (*)    RDW 17.1 (*)    Lymphs Abs 0.6 (*)    All other components within normal limits  BLOOD GAS, VENOUS - Abnormal; Notable for the following components:   pH, Ven 7.21 (*)    pCO2, Ven 94 (*)    pO2, Ven 54.0 (*)    All other components within normal limits  CULTURE, BLOOD (ROUTINE X 2)  CULTURE, BLOOD (ROUTINE X 2)  PROTIME-INR  BRAIN NATRIURETIC PEPTIDE  URINALYSIS, COMPLETE (UACMP) WITH MICROSCOPIC   ____________________________________________  EKG  ED ECG REPORT I, Arta Silence, the attending physician,  personally viewed and interpreted this ECG.  Date: 10/20/2017 EKG Time: 1512 Rate: 98 Rhythm: Atrial fibrillation QRS Axis: normal Intervals: Nonspecific interventricular conduction delay ST/T Wave abnormalities: LVH with repolarization abnormality Narrative Interpretation: no evidence of acute ischemia; no significant change when compared to EKG of 10/14/2015  ED ECG REPORT I, Arta Silence, the attending physician, personally viewed and interpreted this ECG.  Date: 10/20/2017 EKG Time: 1634 Rate: 90 Rhythm: Atrial fibrillation QRS Axis: normal Intervals: Nonspecific intraventricular conduction delay ST/T Wave abnormalities: LVH with repolarization abnormality Narrative Interpretation: no evidence of acute ischemia; no significant change when compared to EKG of earlier today  ____________________________________________  RADIOLOGY  CXR: No focal infiltrate  ____________________________________________   PROCEDURES  Procedure(s) performed: No    Critical Care performed: Yes  CRITICAL CARE Performed by: Arta Silence   Total critical care time: 40 minutes  Critical care time was exclusive of separately billable procedures and treating other patients.  Critical care was necessary to treat or prevent imminent or life-threatening deterioration.  Critical care was time spent personally by me on the following activities: development of treatment plan with patient and/or surrogate as well as nursing, discussions with consultants, evaluation of patient's response to treatment, examination of patient, obtaining history from patient or surrogate, ordering and performing treatments and interventions, ordering and review of laboratory studies, ordering and review of radiographic studies, pulse oximetry and re-evaluation of patient's condition. ____________________________________________   INITIAL IMPRESSION / ASSESSMENT AND PLAN / ED COURSE  Pertinent labs &  imaging results that were available during my care of the patient were reviewed by me and considered in my medical decision making (see chart for details).  82 year old female with history of COPD, and other past medical history as noted above presents with approximately 12 hours worsening shortness of breath with acute respiratory distress.  Per EMS, patient was hypoxic in the field.  Nebs and steroid given in the field.   Review of past medical records in Epic confirms diagnosis of COPD, as well as a history of CHF.  On exam, patient was in moderate respiratory distress with bilateral coarse breath sounds and wheezing.  No peripheral edema.  Patient slightly tachycardic and hypertensive, and O2 sat is in the high 90s on BiPAP.  Hesitation consistent with acute  COPD exacerbation; also consider acute pneumonia or bronchitis, or less likely CHF exacerbation.  Patient responding well to BiPAP with good oxygenation, and decreased work of breathing.  Plan: Continue BiPAP, nebs, obtain labs including cardiac workup, chest x-ray, and reassess.  Plan for likely admission.    ----------------------------------------- 5:25 PM on 10/20/2017 -----------------------------------------  Chest x-ray shows no focal infiltrates.  Patient appears much more comfortable and is stating she feels much better.  Continue on BiPAP for now.  ____________________________________________   FINAL CLINICAL IMPRESSION(S) / ED DIAGNOSES  Final diagnoses:  Acute respiratory failure with hypoxia and hypercapnia (HCC)  COPD exacerbation (HCC)      NEW MEDICATIONS STARTED DURING THIS VISIT:  This SmartLink is deprecated. Use AVSMEDLIST instead to display the medication list for a patient.   Note:  This document was prepared using Dragon voice recognition software and may include unintentional dictation errors.    Arta Silence, MD 10/20/17 [phone]

## 2017-10-20 NOTE — ED Triage Notes (Signed)
Pt brought in by [facility] from home for respiratory distress that started this morning around 0300. Per EMS when fire department arrived pts o2 sats in the 80's pt was placed on O2 with improvement in sats however pt sats dropped into the 80's again and pt was placed on Non-rebreather.   Pt was given 2 Duoneb treatments by EMS as well as sol-u-medrol 125 mg. Upon arrival to ED pt tachypneic with labored breath and accessory muscle use. Pt in moderate distress at this time. Dr. Cherylann Banas at bedside and pt placed on Bi-pap.

## 2017-10-20 NOTE — Progress Notes (Signed)
Noted irregular heart rhythm of a couple runs of V-Tach. Vitals returned to baseline shortly after the event. NP notified and we will continue to monitor closely.

## 2017-10-20 NOTE — ED Notes (Signed)
Called lab to check the status of BNP. Per Gwyn in the lab, BNP was not ran but they will do now.

## 2017-10-20 NOTE — Consult Note (Signed)
PULMONARY / CRITICAL CARE MEDICINE   Name: Julia Crawford MRN: [id number] DOB: 1935-07-01    ADMISSION DATE:  10/20/2017   CONSULTATION DATE:  10/20/17  REFERRING MD:  Dr. Marcille Blanco  Reason: Acute respiratory failure  CHIEF COMPLAINT:  Worsening dyspnea  HISTORY OF PRESENT ILLNESS:   This is an 82 year old African-American female with a past medical history as indicated below presented to the ED with worsening dyspnea. Patient was hypoxic in the 80s. When EMS arrived. At the ED, had chest x-ray was negative, but her blood gas showed severe hypercarbia and hypoxemia. She is being admitted to the ICU for acute respiratory failure. Patient is still tachypneic, somnolent and with multiple episodes of hypoxia.  PAST MEDICAL HISTORY :  She  has a past medical history of Arthritis, Asthma, Atrial fibrillation ([place]) (04/14/2015), COPD (chronic obstructive pulmonary disease) ([place]), Coronary artery disease, Diabetes mellitus without complication ([place]), Emphysema of lung ([place]), GERD (gastroesophageal reflux disease), Hypothyroid, Irregular heart beat, and Myasthenia gravis ([place]).  PAST SURGICAL HISTORY: She  has a past surgical history that includes Tonsillectomy; Partial hysterectomy; Cataract extraction; and Breast biopsy.  Allergies  Allergen Reactions  . Ace Inhibitors Cough    Other reaction(s): Unknown  . Apixaban     Other reaction(s): Unknown  . Azithromycin     Myasthenia Gravis Patient  . Butylene Glycol Other (See Comments)  . Codeine Itching and Swelling  . Doxycycline   . Lasix [Furosemide] Other (See Comments)    Chest and back pain  . Peanut Butter Flavor     rash?  . Telithromycin     Other reaction(s): Other (See Comments) Myasthenia Gravis Patient    No current facility-administered medications on file prior to encounter.    Current Outpatient Medications on File Prior to Encounter  Medication Sig  . aspirin 81 MG tablet Take 1 tablet by mouth daily.  Marland Kitchen  azaTHIOprine (IMURAN) 50 MG tablet 125 mg daily.   Marland Kitchen BREO ELLIPTA 100-25 MCG/INH AEPB Inhale 1 puff daily into the lungs.  . cyanocobalamin 1000 MCG tablet Take 1 tablet by mouth daily.  Marland Kitchen diltiazem (CARDIZEM CD) 120 MG 24 hr capsule TAKE 1 CAPSULE EVERY DAY  . furosemide (LASIX) 20 MG tablet TAKE 1 TABLET (20 MG TOTAL) BY MOUTH DAILY.  Marland Kitchen levothyroxine (SYNTHROID, LEVOTHROID) 100 MCG tablet TAKE 1 TABLET BY MOUTH EVERY DAY  . metFORMIN (GLUCOPHAGE) 500 MG tablet Take 1 tablet (500 mg total) by mouth 2 (two) times daily with a meal.  . metoCLOPramide (REGLAN) 10 MG tablet Take 1 tablet (10 mg total) by mouth 2 (two) times daily.  . NON FORMULARY CPAP MACHINE AT NIGHT TIME  . pyridostigmine (MESTINON) 60 MG tablet Take 1 tablet by mouth 3 (three) times daily.  . sertraline (ZOLOFT) 25 MG tablet Take 1 tablet (25 mg total) by mouth daily.  . simvastatin (ZOCOR) 10 MG tablet Take 1 tablet (10 mg total) by mouth at bedtime.  Marland Kitchen SPIRIVA HANDIHALER 18 MCG inhalation capsule PLACE 1 CAPSULE (18 MCG TOTAL) INTO INHALER AND INHALE DAILY.  Marland Kitchen albuterol (PROVENTIL HFA;VENTOLIN HFA) 108 (90 Base) MCG/ACT inhaler Inhale into the lungs every 6 (six) hours as needed for wheezing or shortness of breath.  . diltiazem (CARDIZEM CD) 120 MG 24 hr capsule TAKE 1 CAPSULE (120 MG TOTAL) BY MOUTH DAILY. (Patient not taking: Reported on 10/20/2017)    FAMILY HISTORY:  Her indicated that her mother is deceased. She indicated that her father is deceased. She indicated that the  status of her brother is unknown.   SOCIAL HISTORY: She  reports that  has never smoked. she has never used smokeless tobacco. She reports that she does not drink alcohol or use drugs.  REVIEW OF SYSTEMS:   Unable to obtain as patient is currently on continuous BiPAP  SUBJECTIVE:    VITAL SIGNS: BP 136/67 (BP Location: Left Arm)   Pulse 85   Temp 98 F (36.7 C) (Axillary)   Resp (!) 33   Ht 5' (1.524 m)   Wt 200 lb 6.4 oz (90.9 kg)    SpO2 94%   BMI 39.14 kg/m   HEMODYNAMICS:    VENTILATOR SETTINGS:    INTAKE / OUTPUT: No intake/output data recorded.  PHYSICAL EXAMINATION: General: Moderate respiratory distress Neuro: Somnolent, arousable to voice and touch HEENT:  PERRLA, trachea midline, neck is supple, no JVD Cardiovascular: Apical pulse regular, S1, S2, no murmur reculture gallop, +2 pulses, no edema Lungs:  Increased work of breathing, bilateral breath sounds, expiratory true wheezes in all lung fields, breath sounds diminished significantly across all lung fields Abdomen:  Soft, nontender, nondistended, normal bowel sounds Musculoskeletal:  No deformities, positive range of motion Skin:  Warm and dry  LABS:  BMET Recent Labs  Lab 10/20/17 1514  NA 137  K 4.5  CL 95*  CO2 30  BUN 10  CREATININE 0.66  GLUCOSE 175*    Electrolytes Recent Labs  Lab 10/20/17 1514  CALCIUM 9.0    CBC Recent Labs  Lab 10/20/17 1514  WBC 5.1  HGB 13.8  HCT 42.0  PLT 183    Coag's Recent Labs  Lab 10/20/17 1514  INR 0.92    Sepsis Markers No results for input(s): LATICACIDVEN, PROCALCITON, O2SATVEN in the last 168 hours.  ABG No results for input(s): PHART, PCO2ART, PO2ART in the last 168 hours.  Liver Enzymes Recent Labs  Lab 10/20/17 1514  AST 23  ALT 9*  ALKPHOS 56  BILITOT 1.0  ALBUMIN 4.5    Cardiac Enzymes Recent Labs  Lab 10/20/17 1514 10/20/17 1902  TROPONINI 0.03* 0.05*    Glucose Recent Labs  Lab 10/20/17 1839  GLUCAP 166*    Imaging Dg Chest Portable 1 View  Result Date: 10/20/2017 CLINICAL DATA:  Respiratory distress EXAM: PORTABLE CHEST 1 VIEW COMPARISON:  CT 12/31/2016 FINDINGS: Cardiac silhouette is enlarged. Elevation of the RIGHT hemidiaphragm. No effusion, infiltrate or pneumothorax. IMPRESSION: Cardiomegaly and elevation RIGHT hemidiaphragm.  No Acute findings Electronically Signed   By: Suzy Bouchard M.D.   On: 10/20/2017 16:59   STUDIES:   None  CULTURES: Blood cultures 2  ANTIBIOTICS: None  SIGNIFICANT EVENTS: 10/20/2017: Admitted  LINES/TUBES: Peripheral IVs Foley catheter  DISCUSSION: 82 year old female presenting with acute hypoxemic respiratory failure, and acute COPD exacerbation  ASSESSMENT  Acute hypoxic respiratory failure Acute COPD exacerbation History of myasthenia gravis History of hypothyroidism. History of type 2 diabetes History of atrial fibrillation. History of GERD  PLAN Hemodynamic monitoring per ICU protocol Continuous BiPAP as tolerated Stat ABG reviewed and BiPAP changes made. Chest x-ray when necessary Nebulized bronchodilators IV steroids 2-D echo Keep nothing by mouth while on BiPAP Resume all home medications GI and DVT prophylaxis  FAMILY  - Updates: Family at bedside, we'll updated when available  - Inter-disciplinary family meet or Palliative Care meeting due by: day [place]. [facility] ANP-BC Pulmonary and Critical Care Medicine [facility] Pager [phone] or [phone]  10/20/2017, 7:59 PM

## 2017-10-20 NOTE — H&P (Signed)
Julia Crawford is an 82 y.o. female.   Chief Complaint: Shortness of breath HPI: The patient with past medical history of COPD, coronary artery disease, diabetes, myasthenia gravis and hypothyroidism presents to the emergency department with shortness of breath.  The patient states that she has become progressively more dyspneic over the last 36-48 hours.  She was immediately placed on BiPAP due to work of breathing as well as hypercapnia.  PCO2 was found to be 94.  As the patient needed to noninvasive ventilation she became more alert.  She denies chest pain but admits to tightness with her breathing and soreness around her lower rib cage bilaterally.  Once patient was stabilized emergency department staff called the hospitalist service for admission.  Past Medical History:  Diagnosis Date  . Arthritis   . Asthma   . Atrial fibrillation ([place]) 04/14/2015  . COPD (chronic obstructive pulmonary disease) ([place])   . Coronary artery disease   . Diabetes mellitus without complication ([place])   . Emphysema of lung ([place])   . GERD (gastroesophageal reflux disease)   . Hypothyroid   . Irregular heart beat   . Myasthenia gravis [facility])     Past Surgical History:  Procedure Laterality Date  . BREAST BIOPSY    . CATARACT EXTRACTION    . PARTIAL HYSTERECTOMY    . TONSILLECTOMY      Family History  Problem Relation Age of Onset  . CAD Mother   . Diabetes Father   . CAD Brother   . Diabetes Brother    Social History:  reports that  has never smoked. she has never used smokeless tobacco. She reports that she does not drink alcohol or use drugs.  Allergies:  Allergies  Allergen Reactions  . Ace Inhibitors Cough    Other reaction(s): Unknown  . Apixaban     Other reaction(s): Unknown  . Azithromycin     Myasthenia Gravis Patient  . Butylene Glycol Other (See Comments)  . Codeine Itching and Swelling  . Doxycycline   . Lasix [Furosemide] Other (See Comments)    Chest and back pain  . Peanut  Butter Flavor     rash?  . Telithromycin     Other reaction(s): Other (See Comments) Myasthenia Gravis Patient    Medications Prior to Admission  Medication Sig Dispense Refill  . aspirin 81 MG tablet Take 1 tablet by mouth daily.    Marland Kitchen azaTHIOprine (IMURAN) 50 MG tablet 125 mg daily.     Marland Kitchen BREO ELLIPTA 100-25 MCG/INH AEPB Inhale 1 puff daily into the lungs.  4  . cyanocobalamin 1000 MCG tablet Take 1 tablet by mouth daily.    Marland Kitchen diltiazem (CARDIZEM CD) 120 MG 24 hr capsule TAKE 1 CAPSULE EVERY DAY 90 capsule 3  . furosemide (LASIX) 20 MG tablet TAKE 1 TABLET (20 MG TOTAL) BY MOUTH DAILY. 90 tablet 3  . levothyroxine (SYNTHROID, LEVOTHROID) 100 MCG tablet TAKE 1 TABLET BY MOUTH EVERY DAY 90 tablet 3  . metFORMIN (GLUCOPHAGE) 500 MG tablet Take 1 tablet (500 mg total) by mouth 2 (two) times daily with a meal. 180 tablet 2  . metoCLOPramide (REGLAN) 10 MG tablet Take 1 tablet (10 mg total) by mouth 2 (two) times daily. 180 tablet 3  . NON FORMULARY CPAP MACHINE AT NIGHT TIME    . pyridostigmine (MESTINON) 60 MG tablet Take 1 tablet by mouth 3 (three) times daily.    . sertraline (ZOLOFT) 25 MG tablet Take 1 tablet (25 mg total)  by mouth daily. 90 tablet 3  . simvastatin (ZOCOR) 10 MG tablet Take 1 tablet (10 mg total) by mouth at bedtime. 90 tablet 1  . SPIRIVA HANDIHALER 18 MCG inhalation capsule PLACE 1 CAPSULE (18 MCG TOTAL) INTO INHALER AND INHALE DAILY. 30 capsule 11  . albuterol (PROVENTIL HFA;VENTOLIN HFA) 108 (90 Base) MCG/ACT inhaler Inhale into the lungs every 6 (six) hours as needed for wheezing or shortness of breath.    . diltiazem (CARDIZEM CD) 120 MG 24 hr capsule TAKE 1 CAPSULE (120 MG TOTAL) BY MOUTH DAILY. (Patient not taking: Reported on 10/20/2017) 30 capsule 11    Results for orders placed or performed during the hospital encounter of 10/20/17 (from the past 48 hour(s))  Troponin I     Status: Abnormal   Collection Time: 10/20/17  3:14 PM  Result Value Ref Range    Troponin I 0.03 (HH) <0.03 ng/mL    Comment: CRITICAL RESULT CALLED TO, READ BACK BY AND VERIFIED WITH ANGELA ROBBINS RN AT 1600 10/20/17. MSS   Brain natriuretic peptide     Status: Abnormal   Collection Time: 10/20/17  3:14 PM  Result Value Ref Range   B Natriuretic Peptide 290.0 (H) 0.0 - 100.0 pg/mL  Comprehensive metabolic panel     Status: Abnormal   Collection Time: 10/20/17  3:14 PM  Result Value Ref Range   Sodium 137 135 - 145 mmol/L   Potassium 4.5 3.5 - 5.1 mmol/L   Chloride 95 (L) 101 - 111 mmol/L   CO2 30 22 - 32 mmol/L   Glucose, Bld 175 (H) 65 - 99 mg/dL   BUN 10 6 - 20 mg/dL   Creatinine, Ser 0.66 0.44 - 1.00 mg/dL   Calcium 9.0 8.9 - 10.3 mg/dL   Total Protein 8.7 (H) 6.5 - 8.1 g/dL   Albumin 4.5 3.5 - 5.0 g/dL   AST 23 15 - 41 U/L   ALT 9 (L) 14 - 54 U/L   Alkaline Phosphatase 56 38 - 126 U/L   Total Bilirubin 1.0 0.3 - 1.2 mg/dL   GFR calc non Af Amer >60 >60 mL/min   GFR calc Af Amer >60 >60 mL/min    Comment: (NOTE) The eGFR has been calculated using the CKD EPI equation. This calculation has not been validated in all clinical situations. eGFR's persistently <60 mL/min signify possible Chronic Kidney Disease.    Anion gap 12 5 - 15  CBC with Differential     Status: Abnormal   Collection Time: 10/20/17  3:14 PM  Result Value Ref Range   WBC 5.1 3.6 - 11.0 K/uL   RBC 3.98 3.80 - 5.20 MIL/uL   Hemoglobin 13.8 12.0 - 16.0 g/dL   HCT 42.0 35.0 - 47.0 %   MCV 105.6 (H) 80.0 - 100.0 fL   MCH 34.7 (H) 26.0 - 34.0 pg   MCHC 32.8 32.0 - 36.0 g/dL   RDW 17.1 (H) 11.5 - 14.5 %   Platelets 183 150 - 440 K/uL   Neutrophils Relative % 72 %   Neutro Abs 3.7 1.4 - 6.5 K/uL   Lymphocytes Relative 12 %   Lymphs Abs 0.6 (L) 1.0 - 3.6 K/uL   Monocytes Relative 14 %   Monocytes Absolute 0.7 0.2 - 0.9 K/uL   Eosinophils Relative 1 %   Eosinophils Absolute 0.0 0 - 0.7 K/uL   Basophils Relative 1 %   Basophils Absolute 0.0 0 - 0.1 K/uL  Blood gas, venous  Status: Abnormal   Collection Time: 10/20/17  3:14 PM  Result Value Ref Range   pH, Ven 7.21 (L) 7.250 - 7.430   pCO2, Ven 94 (HH) 44.0 - 60.0 mmHg    Comment: CRITICAL RESULT CALLED TO, READ BACK BY AND VERIFIED WITH: FELICIA RN [id number] JGT    pO2, Ven 54.0 (H) 32.0 - 45.0 mmHg   Patient temperature 37.0    Collection site VENOUS    Sample type VENOUS   Protime-INR     Status: None   Collection Time: 10/20/17  3:14 PM  Result Value Ref Range   Prothrombin Time 12.3 11.4 - 15.2 seconds   INR 0.92   MRSA PCR Screening     Status: None   Collection Time: 10/20/17  6:37 PM  Result Value Ref Range   MRSA by PCR NEGATIVE NEGATIVE    Comment:        The GeneXpert MRSA Assay (FDA approved for NASAL specimens only), is one component of a comprehensive MRSA colonization surveillance program. It is not intended to diagnose MRSA infection nor to guide or monitor treatment for MRSA infections.   Glucose, capillary     Status: Abnormal   Collection Time: 10/20/17  6:39 PM  Result Value Ref Range   Glucose-Capillary 166 (H) 65 - 99 mg/dL  TSH     Status: None   Collection Time: 10/20/17  7:02 PM  Result Value Ref Range   TSH 1.491 0.350 - 4.500 uIU/mL    Comment: Performed by a 3rd Generation assay with a functional sensitivity of <=0.01 uIU/mL.  Troponin I     Status: Abnormal   Collection Time: 10/20/17  7:02 PM  Result Value Ref Range   Troponin I 0.05 (HH) <0.03 ng/mL    Comment: CRITICAL VALUE NOTED. VALUE IS CONSISTENT WITH PREVIOUSLY REPORTED/CALLED VALUE.MSS  Blood gas, arterial     Status: Abnormal   Collection Time: 10/20/17  9:05 PM  Result Value Ref Range   FIO2 0.60    Delivery systems VENTILATOR    Mode BILEVEL POSITIVE AIRWAY PRESSURE    Inspiratory PAP 15    Expiratory PAP 5    pH, Arterial 7.21 (L) 7.350 - 7.450    Comment: TONYA BROWN 2142 10/20/2017   pCO2 arterial 91 (HH) 32.0 - 48.0 mmHg   pO2, Arterial 78 (L) 83.0 - 108.0 mmHg   Bicarbonate 36.4  (H) 20.0 - 28.0 mmol/L   Acid-Base Excess 5.3 (H) 0.0 - 2.0 mmol/L   O2 Saturation 92.4 %   Patient temperature 37.0    Collection site RIGHT RADIAL    Sample type ARTERIAL DRAW    Allens test (pass/fail) PASS PASS   Mechanical Rate 8    Dg Chest Portable 1 View  Result Date: 10/20/2017 CLINICAL DATA:  Respiratory distress EXAM: PORTABLE CHEST 1 VIEW COMPARISON:  CT 12/31/2016 FINDINGS: Cardiac silhouette is enlarged. Elevation of the RIGHT hemidiaphragm. No effusion, infiltrate or pneumothorax. IMPRESSION: Cardiomegaly and elevation RIGHT hemidiaphragm.  No Acute findings Electronically Signed   By: Suzy Bouchard M.D.   On: 10/20/2017 16:59    Review of Systems  Constitutional: Negative for chills and fever.  HENT: Negative for sore throat and tinnitus.   Eyes: Negative for blurred vision and redness.  Respiratory: Positive for cough and shortness of breath. Negative for sputum production.   Cardiovascular: Negative for chest pain, palpitations, orthopnea and PND.  Gastrointestinal: Negative for abdominal pain, diarrhea, nausea and vomiting.  Genitourinary: Negative for dysuria,  frequency and urgency.  Musculoskeletal: Negative for joint pain and myalgias.  Skin: Negative for rash.       No lesions  Neurological: Negative for speech change, focal weakness and weakness.  Endo/Heme/Allergies: Does not bruise/bleed easily.       No temperature intolerance  Psychiatric/Behavioral: Negative for depression and suicidal ideas.    Blood pressure 136/67, pulse 85, temperature 98 F (36.7 C), temperature source Axillary, resp. rate (!) 33, height 5' (1.524 m), weight 90.9 kg (200 lb 6.4 oz), SpO2 94 %. Physical Exam  Vitals reviewed. Constitutional: She is oriented to person, place, and time. She appears well-developed and well-nourished.  HENT:  Head: Normocephalic and atraumatic.  Mouth/Throat: Oropharynx is clear and moist.  Eyes: Conjunctivae and EOM are normal. Pupils are  equal, round, and reactive to light. No scleral icterus.  Neck: Normal range of motion. Neck supple. No JVD present. No tracheal deviation present. No thyromegaly present.  Cardiovascular: Normal rate, regular rhythm and normal heart sounds. Exam reveals no gallop and no friction rub.  No murmur heard. Respiratory: Effort normal. She has wheezes.  GI: Soft. Bowel sounds are normal. She exhibits no distension. There is no tenderness.  Genitourinary:  Genitourinary Comments: Deferred  Musculoskeletal: She exhibits edema (Trace).  Lymphadenopathy:    She has no cervical adenopathy.  Neurological: She is alert and oriented to person, place, and time. No cranial nerve deficit. She exhibits normal muscle tone.  Skin: Skin is warm and dry. No rash noted. No erythema.  Psychiatric: She has a normal mood and affect. Judgment and thought content normal.     Assessment/Plan This is an 82 year old female admitted for acute on chronic respiratory failure. 1.  Respiratory failure: Acute on chronic; with hypoxia and hypercapnia.  The patient has improved significant significantly on BiPAP.  Continue noninvasive positive pressure ventilation.  Follow pCO2.  Steroids per discretion of ICU staff.  Seen COPD inhalers.  Albuterol scheduled. 2.  CAD: Stable; continue aspirin 3.  Atrial fibrillation: Rate controlled; continue diltiazem 4.  CHF: Systolic; chronic.  Continue Lasix per home regimen.  BNP is only mildly elevated.  No rales on physical exam. 5.  Diabetes mellitus type 2: Check hemoglobin A1c.  Continue metformin as long as no imaging studies require contrast. 6. Hyperlipidemia: Continue statin therapy 7.  Myasthenia gravis: Stable; continue pyridostigmine 5 8.  Depression: Continue sertraline 9.  DVT prophylaxis: Lovenox 10.  GI prophylaxis: None The patient is a full code.  Time spent on admission orders and critical care approximately 45 minutes.  Discussed with [facility] telemedicine  Harrie Foreman, MD 10/20/2017, 9:51 PM

## 2017-10-21 ENCOUNTER — Inpatient Hospital Stay: Payer: Medicare Other

## 2017-10-21 ENCOUNTER — Inpatient Hospital Stay
Admit: 2017-10-21 | Discharge: 2017-10-21 | Disposition: A | Discharge status: Home / Self Care | Payer: Medicare Other | Attending: Adult Health | Admitting: Adult Health

## 2017-10-21 DIAGNOSIS — R0600 Dyspnea, unspecified: Secondary | ICD-10-CM

## 2017-10-21 LAB — MAGNESIUM
Magnesium: 1.5 mg/dL — ABNORMAL LOW (ref 1.7–2.4)
Magnesium: 1.5 mg/dL — ABNORMAL LOW (ref 1.7–2.4)
Magnesium: 2 mg/dL (ref 1.7–2.4)

## 2017-10-21 LAB — BASIC METABOLIC PANEL
ANION GAP: 10 (ref 5–15)
BUN: 15 mg/dL (ref 6–20)
CALCIUM: 8.3 mg/dL — AB (ref 8.9–10.3)
CHLORIDE: 97 mmol/L — AB (ref 101–111)
CO2: 31 mmol/L (ref 22–32)
Creatinine, Ser: 0.77 mg/dL (ref 0.44–1.00)
GFR calc Af Amer: >60 mL/min (ref >60)
GFR calc non Af Amer: >60 mL/min (ref >60)
GLUCOSE: 197 mg/dL — AB (ref 65–99)
Potassium: 4.7 mmol/L (ref 3.5–5.1)
Sodium: 138 mmol/L (ref 135–145)

## 2017-10-21 LAB — BLOOD GAS, ARTERIAL
ACID-BASE EXCESS: 3.7 mmol/L — AB (ref 0.0–2.0)
ACID-BASE EXCESS: 6.8 mmol/L — AB (ref 0.0–2.0)
BICARBONATE: 33.3 mmol/L — AB (ref 20.0–28.0)
BICARBONATE: 36.2 mmol/L — AB (ref 20.0–28.0)
Expiratory PAP: 5
FIO2: 0.6
FIO2: 1
LHR: 10 {breaths}/min
MECHVT: 500 mL
Mechanical Rate: 14
Mode: POSITIVE
O2 SAT: 100 %
O2 SAT: 98.2 %
PATIENT TEMPERATURE: 37
PEEP/CPAP: 5 cmH2O
PH ART: 7.25 — AB (ref 7.350–7.450)
PH ART: 7.28 — AB (ref 7.350–7.450)
PO2 ART: 122 mmHg — AB (ref 83.0–108.0)
PO2 ART: 495 mmHg — AB (ref 83.0–108.0)
Patient temperature: 37
pCO2 arterial: 76 mmHg (ref 32.0–48.0)
pCO2 arterial: 77 mmHg (ref 32.0–48.0)

## 2017-10-21 LAB — PHOSPHORUS
PHOSPHORUS: 4.6 mg/dL (ref 2.5–4.6)
Phosphorus: 4.7 mg/dL — ABNORMAL HIGH (ref 2.5–4.6)
Phosphorus: 3.7 mg/dL (ref 2.5–4.6)

## 2017-10-21 LAB — URINALYSIS, COMPLETE (UACMP) WITH MICROSCOPIC
Bacteria, UA: NONE SEEN
Bilirubin Urine: NEGATIVE
Glucose, UA: NEGATIVE mg/dL
Ketones, ur: NEGATIVE mg/dL
Leukocytes, UA: NEGATIVE
Nitrite: NEGATIVE
Protein, ur: 30 mg/dL — AB
Specific Gravity, Urine: 1.015 (ref 1.005–1.030)
pH: 5 (ref 5.0–8.0)

## 2017-10-21 LAB — BRAIN NATRIURETIC PEPTIDE: B NATRIURETIC PEPTIDE 5: 518 pg/mL — AB (ref 0.0–100.0)

## 2017-10-21 LAB — TROPONIN I
Troponin I: 0.05 ng/mL (ref <0.03)
Troponin I: 0.06 ng/mL (ref <0.03)

## 2017-10-21 LAB — GLUCOSE, CAPILLARY: Glucose-Capillary: 175 mg/dL — ABNORMAL HIGH (ref 65–99)

## 2017-10-21 MED ORDER — VITAL HIGH PROTEIN PO LIQD
1000.0000 mL | ORAL | Status: DC
  Administered 2017-10-21: 1000 mL

## 2017-10-21 MED ORDER — MIDAZOLAM HCL 2 MG/2ML IJ SOLN
4.0000 mg | Freq: Once | INTRAMUSCULAR | Status: AC
  Administered 2017-10-21: 4 mg via INTRAVENOUS

## 2017-10-21 MED ORDER — MIDAZOLAM HCL 2 MG/2ML IJ SOLN
INTRAMUSCULAR | Status: AC
  Filled 2017-10-21: qty 4

## 2017-10-21 MED ORDER — FAMOTIDINE IN NACL 20-0.9 MG/50ML-% IV SOLN
20.0000 mg | INTRAVENOUS | Status: DC
  Administered 2017-10-21 – 2017-10-22 (×2): 20 mg via INTRAVENOUS
  Filled 2017-10-21 (×2): qty 50

## 2017-10-21 MED ORDER — FENTANYL CITRATE (PF) 100 MCG/2ML IJ SOLN
INTRAMUSCULAR | Status: AC
  Filled 2017-10-21: qty 2

## 2017-10-21 MED ORDER — VECURONIUM BROMIDE 10 MG IV SOLR
INTRAVENOUS | Status: AC
  Administered 2017-10-21: 10 mg via INTRAVENOUS
  Filled 2017-10-21: qty 10

## 2017-10-21 MED ORDER — METHYLPREDNISOLONE SODIUM SUCC 40 MG IJ SOLR
20.0000 mg | Freq: Two times a day (BID) | INTRAMUSCULAR | Status: DC
  Administered 2017-10-21: 20 mg via INTRAVENOUS
  Filled 2017-10-21 (×2): qty 1

## 2017-10-21 MED ORDER — VECURONIUM BROMIDE 10 MG IV SOLR
10.0000 mg | Freq: Once | INTRAVENOUS | Status: AC
  Administered 2017-10-21: 10 mg via INTRAVENOUS

## 2017-10-21 MED ORDER — MAGNESIUM SULFATE 2 GM/50ML IV SOLN
2.0000 g | Freq: Once | INTRAVENOUS | Status: AC
  Administered 2017-10-21: 2 g via INTRAVENOUS
  Filled 2017-10-21: qty 50

## 2017-10-21 MED ORDER — STERILE WATER FOR INJECTION IJ SOLN
INTRAMUSCULAR | Status: AC
  Administered 2017-10-21: 10 mL
  Filled 2017-10-21: qty 10

## 2017-10-21 MED ORDER — DOCUSATE SODIUM 50 MG/5ML PO LIQD
100.0000 mg | Freq: Two times a day (BID) | ORAL | Status: DC
  Administered 2017-10-21 – 2017-10-25 (×7): 100 mg
  Filled 2017-10-21 (×7): qty 10

## 2017-10-21 MED ORDER — FENTANYL CITRATE (PF) 100 MCG/2ML IJ SOLN
200.0000 ug | Freq: Once | INTRAMUSCULAR | Status: AC
  Administered 2017-10-21: 200 ug via INTRAVENOUS

## 2017-10-21 MED ORDER — ADULT MULTIVITAMIN LIQUID CH
15.0000 mL | Freq: Every day | ORAL | Status: DC
  Administered 2017-10-21 – 2017-10-28 (×8): 15 mL
  Filled 2017-10-21 (×9): qty 15

## 2017-10-21 MED ORDER — FENTANYL 2500MCG IN NS 250ML (10MCG/ML) PREMIX INFUSION
0.0000 ug/h | INTRAVENOUS | Status: DC
  Administered 2017-10-21: 200 ug/h via INTRAVENOUS
  Administered 2017-10-21: 100 ug/h via INTRAVENOUS
  Administered 2017-10-22: 200 ug/h via INTRAVENOUS
  Administered 2017-10-22: 225 ug/h via INTRAVENOUS
  Administered 2017-10-23 – 2017-10-24 (×3): 200 ug/h via INTRAVENOUS
  Filled 2017-10-21 (×6): qty 250

## 2017-10-21 MED ORDER — INSULIN ASPART 100 UNIT/ML ~~LOC~~ SOLN
0.0000 [IU] | SUBCUTANEOUS | Status: DC
  Administered 2017-10-21 (×2): 2 [IU] via SUBCUTANEOUS
  Administered 2017-10-21: 3 [IU] via SUBCUTANEOUS
  Administered 2017-10-21 – 2017-10-22 (×4): 2 [IU] via SUBCUTANEOUS
  Filled 2017-10-21 (×7): qty 1

## 2017-10-21 MED ORDER — IPRATROPIUM-ALBUTEROL 0.5-2.5 (3) MG/3ML IN SOLN
3.0000 mL | RESPIRATORY_TRACT | Status: DC
  Administered 2017-10-21 – 2017-10-22 (×6): 3 mL via RESPIRATORY_TRACT
  Filled 2017-10-21 (×6): qty 3

## 2017-10-21 NOTE — Progress Notes (Signed)
[facility] [place] NAME: Julia Crawford    MR#:  [id number]  DATE OF BIRTH:  12-05-1935  SUBJECTIVE:  CHIEF COMPLAINT:   Chief Complaint  Patient presents with  . Respiratory Distress    Came with respi distress. Was on Bipap but worsening, so intubated.  REVIEW OF SYSTEMS:  Intubated. ROS  DRUG ALLERGIES:   Allergies  Allergen Reactions  . Ace Inhibitors Cough    Other reaction(s): Unknown  . Apixaban     Other reaction(s): Unknown  . Azithromycin     Myasthenia Gravis Patient  . Butylene Glycol Other (See Comments)  . Codeine Itching and Swelling  . Doxycycline   . Lasix [Furosemide] Other (See Comments)    Chest and back pain  . Peanut Butter Flavor     rash?  . Telithromycin     Other reaction(s): Other (See Comments) Myasthenia Gravis Patient    VITALS:  Blood pressure 126/72, pulse 84, temperature 97.9 F (36.6 C), temperature source Axillary, resp. rate 10, height 5' (1.524 m), weight 94.8 kg (208 lb 15.9 oz), SpO2 97 %.  PHYSICAL EXAMINATION:  GENERAL:  82 y.o.-year-old patient lying in the bed , critical appearance.  EYES: Pupils equal, round, reactive to light and accommodation. No scleral icterus. Extraocular muscles intact.  HEENT: Head atraumatic, normocephalic. Oropharynx and nasopharynx clear.  NECK:  Supple, no jugular venous distention. No thyroid enlargement, no tenderness.  LUNGS: Normal breath sounds bilaterally, wheezing, no crepitation. ETT , on vent support. CARDIOVASCULAR: S1, S2 normal. No murmurs, rubs, or gallops.  ABDOMEN: Soft, nontender, nondistended. Bowel sounds present. No organomegaly or mass.  EXTREMITIES: No pedal edema, cyanosis, or clubbing.  NEUROLOGIC: sedated on vent support.  PSYCHIATRIC: sedated on vent support.  SKIN: No obvious rash, lesion, or ulcer.   Physical Exam LABORATORY PANEL:   CBC Recent Labs  Lab 10/20/17 1514  WBC 5.1  HGB 13.8  HCT 42.0  PLT 183    ------------------------------------------------------------------------------------------------------------------  Chemistries  Recent Labs  Lab 10/20/17 1514  10/21/17 0627  NA 137  --  138  K 4.5  --  4.7  CL 95*  --  97*  CO2 30  --  31  GLUCOSE 175*  --  197*  BUN 10  --  15  CREATININE 0.66  --  0.77  CALCIUM 9.0  --  8.3*  MG  --    < > 1.5*  AST 23  --   --   ALT 9*  --   --   ALKPHOS 56  --   --   BILITOT 1.0  --   --    < > = values in this interval not displayed.   ------------------------------------------------------------------------------------------------------------------  Cardiac Enzymes Recent Labs  Lab 10/20/17 2352 10/21/17 0627  TROPONINI 0.06* 0.05*   ------------------------------------------------------------------------------------------------------------------  RADIOLOGY:  Dg Abd 1 View  Result Date: 10/21/2017 CLINICAL DATA:  Orogastric tube placement EXAM: ABDOMEN - 1 VIEW COMPARISON:  None. FINDINGS: Orogastric tube enters the abdomen in has its tip in the region of the gastric antrum. IMPRESSION: OGT tip gastric antrum. Electronically Signed   By: Nelson Chimes M.D.   On: 10/21/2017 11:44   Dg Chest Port 1 View  Result Date: 10/21/2017 CLINICAL DATA:  Central line placement EXAM: PORTABLE CHEST 1 VIEW COMPARISON:  10/20/2017 FINDINGS: Endotracheal tube is near the carina, 10 mm above the carina. Left central line tip is at the cavoatrial junction. NG tube enters the  stomach. Cardiomegaly with vascular congestion. No confluent opacities. Possible small layering left effusion. No acute bony abnormality. IMPRESSION: Endotracheal tube is near the carina, 10 mm above the carina. Left central line tip at the cavoatrial junction. No pneumothorax. Cardiomegaly, vascular congestion. Suspect small layering left effusion. Electronically Signed   By: Rolm Baptise M.D.   On: 10/21/2017 11:42   Dg Chest Portable 1 View  Result Date:  10/20/2017 CLINICAL DATA:  Respiratory distress EXAM: PORTABLE CHEST 1 VIEW COMPARISON:  CT 12/31/2016 FINDINGS: Cardiac silhouette is enlarged. Elevation of the RIGHT hemidiaphragm. No effusion, infiltrate or pneumothorax. IMPRESSION: Cardiomegaly and elevation RIGHT hemidiaphragm.  No Acute findings Electronically Signed   By: Suzy Bouchard M.D.   On: 10/20/2017 16:59    ASSESSMENT AND PLAN:   Active Problems:   Acute on chronic respiratory failure with hypercapnia (HCC)   Dyspnea  1.  Respiratory failure: Acute on chronic; with hypoxia and hypercapnia.     COPD exacerbation Was on BiPAP.    Worsening, so intubated by Dr. Mortimer Fries.   Cont steroids, nebs and Abx per ICU team. 2.  CAD: Stable; continue aspirin 3.  Atrial fibrillation: Rate controlled; continue diltiazem 4.  CHF: Systolic; chronic.  Continue Lasix per home regimen.  BNP is only mildly elevated.  No rales on physical exam. 5.  Diabetes mellitus type 2: Check hemoglobin A1c.  Continue metformin as long as no imaging studies require contrast. 6. Hyperlipidemia: Continue statin therapy 7.  Myasthenia gravis: Stable; continue pyridostigmine 5 8.  Depression: Continue sertraline 9.  DVT prophylaxis: Lovenox 10.  GI prophylaxis: None   All the records are reviewed and case discussed with Care Management/Social Workerr. Management plans discussed with the patient, family and they are in agreement.  CODE STATUS: Full.  TOTAL TIME TAKING CARE OF THIS PATIENT: 35 minutes.   POSSIBLE D/C IN 2-3 DAYS, DEPENDING ON CLINICAL CONDITION.   Vaughan Basta M.D on 10/21/2017   Between 7am to 6pm - Pager - [phone]  After 6pm go to [email or web address] - password [email or web address]  Office  [phone]  CC: Primary care physician; Jerrol Banana., MD  Note: This dictation was prepared with Dragon dictation along with smaller phrase technology. Any transcriptional errors that result from this  process are unintentional.

## 2017-10-21 NOTE — Procedures (Signed)
Central Venous Catheter Placement:TRIPLE LUMEN Indication: Patient receiving vesicant or irritant drug.; Patient receiving intravenous therapy for longer than 5 days.; Patient has limited or no vascular access.   Consent:emergent    Hand washing performed prior to starting the procedure.   Procedure:   An active timeout was performed and correct patient, name, & ID confirmed.   Patient was positioned correctly for central venous access.  Patient was prepped using strict sterile technique including chlorohexadine preps, sterile drape, sterile gown and sterile gloves.    The area was prepped, draped and anesthetized in the usual sterile manner. Patient comfort was obtained.    A triple lumen catheter was placed in LEFT Internal Jugular Vein There was good blood return, catheter caps were placed on lumens, catheter flushed easily, the line was secured and a sterile dressing and BIO-PATCH applied.   Ultrasound was used to visualize vasculature and guidance of needle.   Number of Attempts: 1 Complications:none Estimated Blood Loss: none Chest Radiograph indicated and ordered.  Operator: Millan Legan.   Jamaurion Slemmer David Prabhnoor Ellenberger, M.D.  [facility]  Medical Director ICU-[facility] Medical Director [facility] Cardio-Pulmonary Department     

## 2017-10-21 NOTE — Progress Notes (Signed)
Patient's respiratory status has improved since receiving the solumedrol and lasix. Her respirations have gone from mid to upper 30s to 20 breaths per minute. She had not voided this shift so I bladder scanned her and obtained a reading of 227ml. Patient attached to a PureWick catheter at this moment and awaiting for her to void. Will bladder scan again if she doesn't void. NP aware of this information. Patient has been conversing with this RN and seems to be in good spirits despite her situation. Safety maintained and will continue to monitor.

## 2017-10-21 NOTE — Progress Notes (Signed)
Initial Nutrition Assessment  DOCUMENTATION CODES:   Obesity unspecified  INTERVENTION:  Recommend initiating Vital High Protein at 50 mL/hr (1200 mL goal daily volume) via OGT. Goal regimen provides 1200 kcal, 105 grams of protein, 1008 mL H2O daily.  Recommend providing liquid multivitamin with minerals per tube daily as goal TF regimen does not meet 100% RDIs for vitamins/minerals.  Recommend trending phosphorus with respiratory failure and replacing as needed. Currently elevated.  NUTRITION DIAGNOSIS:   Inadequate oral intake related to inability to eat as evidenced by NPO status(patient currently intubated).  GOAL:   Provide needs based on ASPEN/SCCM guidelines  MONITOR:   Vent status, Labs, Weight trends, TF tolerance, I & O's  REASON FOR ASSESSMENT:   Ventilator, Consult Enteral/tube feeding initiation and management  ASSESSMENT:   82 year old female with PMHx of CAD, COPD, arthritis, CHF, DM type 2, myasthenia gravis, hypothyroidism, A-fib, GERD, emphysema of lung, hx of partial hysterectomy who presented with shortness of breath who presented with acute on chronic respiratory failure with hypoxia and hypercapnia and required emergent intubation on 11/12 after failing BiPAP.   Patient's son at bedside at time of RD assessment. He reports patient was eating very well PTA. Abdomen soft on exam.  Per weight trend in chart patient appears to fluctuate between 200-205 lbs this past year. She was 200.4 lbs (90.9 kg) on admission. Current weight of 94.8 kg likely falsely elevated with fluid. Will use 90.9 kg to estimate needs at this time, but will continue to monitor weight trend.  Access: OGT placed 11/12; verified to terminate in gastric antrum on abdominal x-ray from 11/12; no details on tube documented at this time with fluid.  MAP: 80-88 mmHg  Patient is currently intubated on ventilator support MV: 4.6 L/min Temp (24hrs), Avg:98.3 F (36.8 C), Min:97.9 F (36.6  C), Max:98.9 F (37.2 C)  Propofol: N/A  Medications reviewed and include: Colace, Lasix 20 mg IV daily, Novolog 0-9 units Q4hrs, levothyroxine, metformin 500 mg BID, Reglan 10 mg BID, vitamin B12 1000 micrograms PO daily, NS @ 100 mL/hr, fentanyl gtt. Patient received magnesium sulfate 2 grams IV this morning.  Labs reviewed: CBG 166-175, Chloride 97, Phosphorus 4.7, Magnesium 1.5.  I/O: only 275 mL UOP overnight  Discussed with RN.  NUTRITION - FOCUSED PHYSICAL EXAM:    Most Recent Value  Orbital Region  No depletion  Upper Arm Region  No depletion  Thoracic and Lumbar Region  No depletion  Buccal Region  Unable to assess  Temple Region  No depletion  Clavicle Bone Region  No depletion  Clavicle and Acromion Bone Region  No depletion  Scapular Bone Region  Unable to assess  Dorsal Hand  No depletion  Patellar Region  No depletion  Anterior Thigh Region  No depletion  Posterior Calf Region  No depletion  Edema (RD Assessment)  Mild [to bilateral lower extremities]  Hair  Reviewed  Eyes  Unable to assess  Mouth  Unable to assess  Skin  Reviewed  Nails  Reviewed     Diet Order:  Diet regular Room service appropriate? Yes; Fluid consistency: Thin  EDUCATION NEEDS:   No education needs have been identified at this time  Skin:  Skin Assessment: Reviewed RN Assessment(no issues)  Last BM:  10/20/2017  Height:   Ht Readings from Last 1 Encounters:  10/20/17 5' (1.524 m)    Weight:   Wt Readings from Last 1 Encounters:  10/21/17 208 lb 15.9 oz (94.8 kg)  Ideal Body Weight:  45.5 kg  BMI:  Body mass index is 40.82 kg/m.  With weight of 90.9 kg from 11/11 patient's BMI is 39.14 kg/m2  Estimated Nutritional Needs:   Kcal:  1000-1273 (11-14 kcal/kg)  Protein:  >/= 91 grams (>/= 2 grams/kg IBW)  Fluid:  1.4-1.6 L/day (30-35 mL/kg IBW)  Willey Blade, MS, RD, LDN Office: [phone] Pager: [phone] After Hours/Weekend Pager: [phone]

## 2017-10-21 NOTE — Progress Notes (Addendum)
Updated Dr. Mortimer Fries that patient is having work of breathing though patient stated that her breathing was "much better than when she first arrived to hospital.  I took that patient's bipap off to perform mouth care and the patient's work of breathing became worse- her oxygen saturation dropped to 86% and patient stated she was having a difficult time breathing.  Per Dr. Mortimer Fries - orders for emergent intubation and central line.  Both were placed and patient vitals stable- family at bedside and updated.

## 2017-10-21 NOTE — Progress Notes (Signed)
Patient with increased WOB, using accessory muscles to breathe on biPAP Patient has failed biPAP therapy.   I have called and updated duaghter and have explained that patient is critically ill and high risk for cardiac arrest death. Daughter understands, will plan for intubation and vent support.   Family are satisfied with Plan of action and management. All questions answered  Corrin Parker, M.D.  [facility]  Medical Director [facility] Director [facility] Cardio-Pulmonary Department

## 2017-10-21 NOTE — Progress Notes (Signed)
Decreased FIO2 to 30% 

## 2017-10-21 NOTE — Progress Notes (Signed)
S: Notified by nurse that patient had runs of V-tach after being started on a valproate bolus. Arrhythmia noted after patient had received about 10 mg. Nurse notified by pharmacy that the Depacon was meant for another patient. Infusion stopped immediately and sent back to the pharmacy. Patient's HR gradually returned to baseline without any interventions. Charge nurse(Erica) notified/. Safety zone portal to be completed by patient's nurse.  O: BiPAP, tachypneic, AP regular, 95-105 bpm, S1/S2, no MRG.  Plan  Continue current treatment plan Will continue to monitor.  No family at bedside. Will update when available.   Tangela Dolliver S. [facility] ANP-BC Pulmonary and Critical Care Medicine [facility] Pager [phone] or [phone]

## 2017-10-21 NOTE — Procedures (Signed)
Endotracheal Intubation: Patient required placement of an artificial airway secondary to Respiratory Failure  Consent: Emergent.   Hand washing performed prior to starting the procedure.   Medications administered for sedation prior to procedure:  Midazolam 4 mg IV,  Vecuronium 10 mg IV, Fentanyl 100 mcg IV.    A time out procedure was called and correct patient, name, & ID confirmed. Needed supplies and equipment were assembled and checked to include ETT, 10 ml syringe, Glidescope, Mac and Miller blades, suction, oxygen and bag mask valve, end tidal CO2 monitor.   Patient was positioned to align the mouth and pharynx to facilitate visualization of the glottis.   Heart rate, SpO2 and blood pressure was continuously monitored during the procedure. Pre-oxygenation was conducted prior to intubation and endotracheal tube was placed through the vocal cords into the trachea.     The artificial airway was placed under direct visualization via glidescope route using a 8.5 ETT on the first attempt.  ETT was secured at 23 cm mark.  Placement was confirmed by auscuitation of lungs with good breath sounds bilaterally and no stomach sounds.  Condensation was noted on endotracheal tube.   Pulse ox 98%.  CO2 detector in place with appropriate color change.   Complications: None .   Operator: Nyima Vanacker.   Chest radiograph ordered and pending.   Comments: OGT placed via glidescope.  Theador Jezewski David Tevon Berhane, M.D.  [facility]  Medical Director ICU[facility] Medical Director [facility] Cardio-Pulmonary Department       

## 2017-10-22 ENCOUNTER — Inpatient Hospital Stay: Payer: Medicare Other

## 2017-10-22 DIAGNOSIS — J95859 Other complication of respirator [ventilator]: Secondary | ICD-10-CM

## 2017-10-22 DIAGNOSIS — J4552 Severe persistent asthma with status asthmaticus: Principal | ICD-10-CM

## 2017-10-22 LAB — CBC WITH DIFFERENTIAL/PLATELET
BASOS ABS: 0 10*3/uL (ref 0–0.1)
BASOS PCT: 0 %
EOS ABS: 0 10*3/uL (ref 0–0.7)
Eosinophils Relative: 0 %
HCT: 34.6 % — ABNORMAL LOW (ref 35.0–47.0)
HEMOGLOBIN: 11.3 g/dL — AB (ref 12.0–16.0)
Lymphocytes Relative: 1 %
Lymphs Abs: 0.1 10*3/uL — ABNORMAL LOW (ref 1.0–3.6)
MCH: 34.8 pg — ABNORMAL HIGH (ref 26.0–34.0)
MCHC: 32.6 g/dL (ref 32.0–36.0)
MCV: 106.7 fL — ABNORMAL HIGH (ref 80.0–100.0)
Monocytes Absolute: 0.6 10*3/uL (ref 0.2–0.9)
Monocytes Relative: 8 %
NEUTROS PCT: 91 %
Neutro Abs: 6.4 10*3/uL (ref 1.4–6.5)
Platelets: 161 10*3/uL (ref 150–440)
RBC: 3.25 MIL/uL — AB (ref 3.80–5.20)
RDW: 17.4 % — ABNORMAL HIGH (ref 11.5–14.5)
WBC: 7.1 10*3/uL (ref 3.6–11.0)

## 2017-10-22 LAB — BASIC METABOLIC PANEL
Anion gap: 8 (ref 5–15)
BUN: 25 mg/dL — ABNORMAL HIGH (ref 6–20)
CALCIUM: 7.8 mg/dL — AB (ref 8.9–10.3)
CO2: 29 mmol/L (ref 22–32)
CREATININE: 0.82 mg/dL (ref 0.44–1.00)
Chloride: 101 mmol/L (ref 101–111)
GFR calc non Af Amer: >60 mL/min (ref >60)
Glucose, Bld: 226 mg/dL — ABNORMAL HIGH (ref 65–99)
Potassium: 4.5 mmol/L (ref 3.5–5.1)
SODIUM: 138 mmol/L (ref 135–145)

## 2017-10-22 LAB — BLOOD GAS, ARTERIAL
ACID-BASE EXCESS: 6.6 mmol/L — AB (ref 0.0–2.0)
BICARBONATE: 30.5 mmol/L — AB (ref 20.0–28.0)
FIO2: 0.4
LHR: 20 {breaths}/min
O2 Saturation: 99.5 %
PEEP/CPAP: 5 cmH2O
PH ART: 7.49 — AB (ref 7.350–7.450)
Patient temperature: 37
VT: 500 mL
pCO2 arterial: 40 mmHg (ref 32.0–48.0)
pO2, Arterial: 154 mmHg — ABNORMAL HIGH (ref 83.0–108.0)

## 2017-10-22 LAB — GLUCOSE, CAPILLARY
Glucose-Capillary: 171 mg/dL — ABNORMAL HIGH (ref 65–99)
Glucose-Capillary: 176 mg/dL — ABNORMAL HIGH (ref 65–99)
Glucose-Capillary: 186 mg/dL — ABNORMAL HIGH (ref 65–99)
Glucose-Capillary: 196 mg/dL — ABNORMAL HIGH (ref 65–99)
Glucose-Capillary: 197 mg/dL — ABNORMAL HIGH (ref 65–99)
Glucose-Capillary: 198 mg/dL — ABNORMAL HIGH (ref 65–99)
Glucose-Capillary: 217 mg/dL — ABNORMAL HIGH (ref 65–99)
Glucose-Capillary: 219 mg/dL — ABNORMAL HIGH (ref 65–99)
Glucose-Capillary: 221 mg/dL — ABNORMAL HIGH (ref 65–99)
Glucose-Capillary: 225 mg/dL — ABNORMAL HIGH (ref 65–99)
Glucose-Capillary: 264 mg/dL — ABNORMAL HIGH (ref 65–99)

## 2017-10-22 LAB — ECHOCARDIOGRAM COMPLETE
Height: 60 in
Weight: 3343.94 oz

## 2017-10-22 LAB — TRIGLYCERIDES: TRIGLYCERIDES: 83 mg/dL (ref <150)

## 2017-10-22 LAB — MAGNESIUM: MAGNESIUM: 2.1 mg/dL (ref 1.7–2.4)

## 2017-10-22 LAB — TROPONIN I: TROPONIN I: 0.05 ng/mL — AB (ref <0.03)

## 2017-10-22 MED ORDER — CHLORHEXIDINE GLUCONATE 0.12 % MT SOLN
OROMUCOSAL | Status: AC
  Filled 2017-10-22: qty 15

## 2017-10-22 MED ORDER — VECURONIUM BROMIDE 10 MG IV SOLR
10.0000 mg | Freq: Once | INTRAVENOUS | Status: AC
  Administered 2017-10-22: 10 mg via INTRAVENOUS

## 2017-10-22 MED ORDER — ARTIFICIAL TEARS OPHTHALMIC OINT
1.0000 "application " | TOPICAL_OINTMENT | Freq: Three times a day (TID) | OPHTHALMIC | Status: DC
  Administered 2017-10-22 (×2): 1 via OPHTHALMIC
  Filled 2017-10-22: qty 3.5

## 2017-10-22 MED ORDER — BUDESONIDE 0.25 MG/2ML IN SUSP
0.2500 mg | Freq: Four times a day (QID) | RESPIRATORY_TRACT | Status: DC
  Administered 2017-10-22 – 2017-10-26 (×17): 0.25 mg via RESPIRATORY_TRACT
  Filled 2017-10-22 (×19): qty 2

## 2017-10-22 MED ORDER — FENTANYL BOLUS VIA INFUSION
50.0000 ug | INTRAVENOUS | Status: DC | PRN
  Administered 2017-10-23 – 2017-10-24 (×3): 50 ug via INTRAVENOUS
  Filled 2017-10-22: qty 50

## 2017-10-22 MED ORDER — IPRATROPIUM BROMIDE 0.02 % IN SOLN
0.5000 mg | Freq: Four times a day (QID) | RESPIRATORY_TRACT | Status: DC
  Administered 2017-10-22 – 2017-11-08 (×69): 0.5 mg via RESPIRATORY_TRACT
  Filled 2017-10-22 (×70): qty 2.5

## 2017-10-22 MED ORDER — VECURONIUM BROMIDE 10 MG IV SOLR: 10.0000 mg | Freq: Once | INTRAVENOUS | Status: DC

## 2017-10-22 MED ORDER — MIDAZOLAM HCL 2 MG/2ML IJ SOLN: 4.0000 mg | Freq: Once | INTRAMUSCULAR | Status: DC

## 2017-10-22 MED ORDER — ACETAMINOPHEN 325 MG PO TABS
650.0000 mg | ORAL_TABLET | Freq: Four times a day (QID) | ORAL | Status: DC | PRN
  Administered 2017-10-28 – 2017-11-06 (×2): 650 mg
  Filled 2017-10-22 (×2): qty 2

## 2017-10-22 MED ORDER — STERILE WATER FOR INJECTION IJ SOLN
INTRAMUSCULAR | Status: AC
  Administered 2017-10-22: 10 mL
  Filled 2017-10-22: qty 10

## 2017-10-22 MED ORDER — LEVALBUTEROL HCL 0.63 MG/3ML IN NEBU
0.6300 mg | INHALATION_SOLUTION | RESPIRATORY_TRACT | Status: DC | PRN
  Administered 2017-10-22 – 2017-10-29 (×8): 0.63 mg via RESPIRATORY_TRACT
  Filled 2017-10-22 (×7): qty 3

## 2017-10-22 MED ORDER — ORAL CARE MOUTH RINSE
15.0000 mL | OROMUCOSAL | Status: DC
Start: 2017-10-22 — End: 2017-11-08
  Administered 2017-10-22 – 2017-11-08 (×150): 15 mL via OROMUCOSAL

## 2017-10-22 MED ORDER — SODIUM CHLORIDE 0.9 % IV SOLN
3.0000 ug/kg/min | INTRAVENOUS | Status: DC
  Filled 2017-10-22: qty 20

## 2017-10-22 MED ORDER — METHYLPREDNISOLONE SODIUM SUCC 125 MG IJ SOLR
80.0000 mg | Freq: Two times a day (BID) | INTRAMUSCULAR | Status: DC
  Administered 2017-10-22 – 2017-10-23 (×4): 80 mg via INTRAVENOUS
  Administered 2017-10-24: 62.5 mg via INTRAVENOUS
  Filled 2017-10-22 (×5): qty 2

## 2017-10-22 MED ORDER — CHLORHEXIDINE GLUCONATE 0.12% ORAL RINSE (MEDLINE KIT)
15.0000 mL | Freq: Two times a day (BID) | OROMUCOSAL | Status: DC
  Administered 2017-10-22 – 2017-11-08 (×33): 15 mL via OROMUCOSAL

## 2017-10-22 MED ORDER — INSULIN ASPART 100 UNIT/ML ~~LOC~~ SOLN
0.0000 [IU] | SUBCUTANEOUS | Status: DC
  Administered 2017-10-22 (×2): 5 [IU] via SUBCUTANEOUS
  Administered 2017-10-23: 3 [IU] via SUBCUTANEOUS
  Administered 2017-10-23: 8 [IU] via SUBCUTANEOUS
  Administered 2017-10-23: 5 [IU] via SUBCUTANEOUS
  Administered 2017-10-23: 8 [IU] via SUBCUTANEOUS
  Administered 2017-10-23 (×2): 5 [IU] via SUBCUTANEOUS
  Administered 2017-10-23: 3 [IU] via SUBCUTANEOUS
  Administered 2017-10-24: 2 [IU] via SUBCUTANEOUS
  Administered 2017-10-24: 8 [IU] via SUBCUTANEOUS
  Administered 2017-10-24: 2 [IU] via SUBCUTANEOUS
  Administered 2017-10-24 (×2): 5 [IU] via SUBCUTANEOUS
  Administered 2017-10-24: 2 [IU] via SUBCUTANEOUS
  Administered 2017-10-25: 5 [IU] via SUBCUTANEOUS
  Administered 2017-10-25: 3 [IU] via SUBCUTANEOUS
  Administered 2017-10-25 (×2): 5 [IU] via SUBCUTANEOUS
  Administered 2017-10-25: 3 [IU] via SUBCUTANEOUS
  Administered 2017-10-26 (×2): 5 [IU] via SUBCUTANEOUS
  Administered 2017-10-26: 3 [IU] via SUBCUTANEOUS
  Administered 2017-10-26 – 2017-10-27 (×6): 5 [IU] via SUBCUTANEOUS
  Administered 2017-10-27 (×2): 3 [IU] via SUBCUTANEOUS
  Administered 2017-10-27: 5 [IU] via SUBCUTANEOUS
  Administered 2017-10-28 (×3): 3 [IU] via SUBCUTANEOUS
  Administered 2017-10-28 (×2): 5 [IU] via SUBCUTANEOUS
  Administered 2017-10-28 – 2017-10-30 (×4): 3 [IU] via SUBCUTANEOUS
  Administered 2017-10-30 – 2017-11-01 (×4): 2 [IU] via SUBCUTANEOUS
  Administered 2017-11-02 (×2): 3 [IU] via SUBCUTANEOUS
  Administered 2017-11-02 – 2017-11-03 (×3): 2 [IU] via SUBCUTANEOUS
  Administered 2017-11-03: 3 [IU] via SUBCUTANEOUS
  Administered 2017-11-03: 2 [IU] via SUBCUTANEOUS
  Administered 2017-11-03: 3 [IU] via SUBCUTANEOUS
  Administered 2017-11-04 – 2017-11-05 (×6): 2 [IU] via SUBCUTANEOUS
  Administered 2017-11-05: 8 [IU] via SUBCUTANEOUS
  Administered 2017-11-05: 3 [IU] via SUBCUTANEOUS
  Administered 2017-11-05: 5 [IU] via SUBCUTANEOUS
  Administered 2017-11-06 (×3): 3 [IU] via SUBCUTANEOUS
  Administered 2017-11-06: 5 [IU] via SUBCUTANEOUS
  Administered 2017-11-07 (×2): 2 [IU] via SUBCUTANEOUS
  Administered 2017-11-07: 5 [IU] via SUBCUTANEOUS
  Administered 2017-11-07 – 2017-11-08 (×7): 3 [IU] via SUBCUTANEOUS
  Filled 2017-10-22 (×76): qty 1

## 2017-10-22 MED ORDER — MIDAZOLAM HCL 2 MG/2ML IJ SOLN
INTRAMUSCULAR | Status: AC
  Administered 2017-10-22: 4 mg
  Filled 2017-10-22: qty 4

## 2017-10-22 MED ORDER — VITAL HIGH PROTEIN PO LIQD
1000.0000 mL | ORAL | Status: DC
  Administered 2017-10-22 – 2017-10-23 (×2): 1000 mL

## 2017-10-22 MED ORDER — VITAL HIGH PROTEIN PO LIQD: 1000.0000 mL | ORAL | Status: DC

## 2017-10-22 MED ORDER — PROPOFOL 1000 MG/100ML IV EMUL
INTRAVENOUS | Status: AC
  Administered 2017-10-22: 5 ug/kg/min via INTRAVENOUS
  Filled 2017-10-22: qty 100

## 2017-10-22 MED ORDER — PYRIDOSTIGMINE BROMIDE 60 MG PO TABS
60.0000 mg | ORAL_TABLET | Freq: Three times a day (TID) | ORAL | Status: DC
  Administered 2017-10-22 – 2017-10-28 (×18): 60 mg
  Filled 2017-10-22 (×19): qty 1

## 2017-10-22 MED ORDER — FREE WATER
200.0000 mL | Freq: Three times a day (TID) | Status: DC
  Administered 2017-10-22 – 2017-10-28 (×18): 200 mL

## 2017-10-22 MED ORDER — LEVOTHYROXINE SODIUM 100 MCG PO TABS
100.0000 ug | ORAL_TABLET | Freq: Every day | ORAL | Status: DC
  Administered 2017-10-22 – 2017-11-02 (×10): 100 ug
  Filled 2017-10-22 (×10): qty 1

## 2017-10-22 MED ORDER — FOLIC ACID 5 MG/ML IJ SOLN
1.0000 mg | Freq: Every day | INTRAMUSCULAR | Status: DC
  Administered 2017-10-22 – 2017-10-23 (×2): 1 mg via INTRAVENOUS
  Filled 2017-10-22 (×3): qty 0.2

## 2017-10-22 MED ORDER — MAGNESIUM SULFATE 2 GM/50ML IV SOLN
2.0000 g | Freq: Once | INTRAVENOUS | Status: AC
  Administered 2017-10-22: 2 g via INTRAVENOUS
  Filled 2017-10-22: qty 50

## 2017-10-22 MED ORDER — MIDAZOLAM HCL 2 MG/2ML IJ SOLN
1.0000 mg | INTRAMUSCULAR | Status: DC | PRN
  Administered 2017-10-22 (×2): 1 mg via INTRAVENOUS

## 2017-10-22 MED ORDER — PRO-STAT SUGAR FREE PO LIQD
60.0000 mL | Freq: Two times a day (BID) | ORAL | Status: DC
  Administered 2017-10-22 (×2): 60 mL

## 2017-10-22 MED ORDER — MIDAZOLAM HCL 2 MG/2ML IJ SOLN
INTRAMUSCULAR | Status: AC
  Administered 2017-10-22: 1 mg via INTRAVENOUS
  Filled 2017-10-22: qty 2

## 2017-10-22 MED ORDER — CISATRACURIUM BOLUS VIA INFUSION
0.0500 mg/kg | Freq: Once | INTRAVENOUS | Status: DC
  Filled 2017-10-22: qty 5

## 2017-10-22 MED ORDER — ASPIRIN 325 MG PO TABS
325.0000 mg | ORAL_TABLET | Freq: Every day | ORAL | Status: DC
  Administered 2017-10-22 – 2017-10-29 (×7): 325 mg
  Filled 2017-10-22 (×7): qty 1

## 2017-10-22 MED ORDER — LEVALBUTEROL HCL 1.25 MG/0.5ML IN NEBU
1.2500 mg | INHALATION_SOLUTION | Freq: Four times a day (QID) | RESPIRATORY_TRACT | Status: DC
  Administered 2017-10-22 – 2017-10-28 (×20): 1.25 mg via RESPIRATORY_TRACT
  Filled 2017-10-22 (×28): qty 0.5

## 2017-10-22 MED ORDER — VECURONIUM BROMIDE 10 MG IV SOLR
INTRAVENOUS | Status: AC
  Administered 2017-10-22: 10 mg via INTRAVENOUS
  Filled 2017-10-22: qty 10

## 2017-10-22 MED ORDER — PROPOFOL 1000 MG/100ML IV EMUL
0.0000 ug/kg/min | INTRAVENOUS | Status: DC
  Administered 2017-10-22 (×2): 5 ug/kg/min via INTRAVENOUS
  Administered 2017-10-24 – 2017-10-26 (×6): 20 ug/kg/min via INTRAVENOUS
  Administered 2017-10-27: 25 ug/kg/min via INTRAVENOUS
  Administered 2017-10-27 (×2): 30 ug/kg/min via INTRAVENOUS
  Administered 2017-10-27: 25 ug/kg/min via INTRAVENOUS
  Administered 2017-10-28: 20 ug/kg/min via INTRAVENOUS
  Filled 2017-10-22 (×15): qty 100

## 2017-10-22 MED ORDER — VECURONIUM BROMIDE 10 MG IV SOLR: 10.0000 mg | INTRAVENOUS | Status: DC | PRN

## 2017-10-22 NOTE — Progress Notes (Signed)
Pt desatting on the vent and showing increased WOB, alarming the vent, Access muscle use, eyes open, and elevated ETCO2 following bath.  Per NP Maggie we administered 4 mg versed, which seemed to have a reverse effect on the patient, just like early this AM,  WOB increased and patient seemed to be holding her breath, airways too tight to effectively ventilate per RT, per [facility] we gave 10 mgs vecuronium pt slowly calmed, ETCO2 decresed to 30s, HR in 60s.  Prop on delay per Burman Nieves, who spoke with the patietn's daughter at length at bedside.

## 2017-10-22 NOTE — Progress Notes (Signed)
Initial Nutrition Assessment  DOCUMENTATION CODES:   Obesity unspecified  INTERVENTION:  New rate of Vital High Protein is 20 mL/hr. Recommend providing Pro-Stat 60 mL BID. New goal regimen provides 880 kcal, 102 grams protein, 739 mL daily. With current propofol rate provides 1181 kcal.  With current free water flush of 200 mL Q8hrs patient receiving total of 1339 mL H2O daily including water in tube feeding.  Continue liquid multivitamin with minerals per tube daily as goal TF regimen does not meet 100% RDIs for vitamins/minerals.  NUTRITION DIAGNOSIS:   Inadequate oral intake related to inability to eat as evidenced by NPO status(patient currently intubated).  Ongoing.  GOAL:   Provide needs based on ASPEN/SCCM guidelines  Addressing with addition of Pro-Stat.  MONITOR:   Vent status, Labs, Weight trends, TF tolerance, I & O's  REASON FOR ASSESSMENT:   Ventilator, Consult Enteral/tube feeding initiation and management  ASSESSMENT:   82 year old female with PMHx of CAD, COPD, arthritis, CHF, DM type 2, myasthenia gravis, hypothyroidism, A-fib, GERD, emphysema of lung, hx of partial hysterectomy who presented with shortness of breath who presented with acute on chronic respiratory failure with hypoxia and hypercapnia and required emergent intubation on 11/12 after failing BiPAP.  Patient starting on paralytic today as she was dyssynchronous with ventilator and had elevated airway pressures this AM. No BM yet.  Access: OGT placed 11/12; verified to terminate in gastric antrum on abdominal x-ray 11/12; 71 cm at corner of mouth  TF: patient was started on VHP at 50 mL/hr yesterday at around 1700 and tolerated well; was decreased to 20 mL/hr this AM as patient will be starting on Nimbex (still safe to feed on Nimbex as it does not affect smooth muscle of GI tract, but plan discussed on rounds is to continue on decreased rate of 20 mL/hr); patient ordered for free water flush of  200 mL Q8hrs  MAP: 63-79 mmHg  Patient is currently intubated on ventilator support MV: 4.7 L/min Temp (24hrs), Avg:98.8 F (37.1 C), Min:98.6 F (37 C), Max:99.3 F (37.4 C)  Propofol: 11.4 ml/hr (301 kcal daily)  Medications reviewed and include: Colace, folic acid 1 mg daily IV, Novolog 0-9 units Q4hrs, levothyroxine, methylprednisolone 80 mg Q12hrs, liquid MVI daily per tube, Nimbex gtt, famotidine, fentanyl gtt, propofol gtt.  Labs reviewed: CBG 176-225, BUN 25. Yesterday at 1340 Phosphorus normalized to 3.7, and Magnesium was 2 following replacement. Noted RBC folate and vitamin B12 levels are going to be drawn tomorrow morning. Patient has Hgb 11.3, Hct 34.6, and MCV 106.7 today. Could not find a previous folate level. In 2012 and 2015 patient had a normal B12 even with elevated MCV. Suspect may be folate deficiency, but results may be altered as patient has been on IV folic acid this admission.  Diet Order:  No diet orders on file  EDUCATION NEEDS:   No education needs have been identified at this time  Skin:  Skin Assessment: Reviewed RN Assessment(no issues)  Last BM:  10/20/2017  Height:   Ht Readings from Last 1 Encounters:  10/20/17 5' (1.524 m)    Weight:   Wt Readings from Last 1 Encounters:  10/21/17 208 lb 15.9 oz (94.8 kg)    Ideal Body Weight:  45.5 kg  BMI:  Body mass index is 40.82 kg/m.  Estimated Nutritional Needs:   Kcal:  1000-1273 (11-14 kcal/kg)  Protein:  >/= 91 grams (>/= 2 grams/kg IBW)  Fluid:  1.4-1.6 L/day (30-35 mL/kg IBW)  Benino Korinek, MS, RD, LDN Office: [phone] Pager: [phone] After Hours/Weekend Pager: [phone]  

## 2017-10-22 NOTE — Progress Notes (Addendum)
Pt moderately sedated, RASS 3, GCS 7, Flicks her finger to nail pressure, otherwise not responsive, TOF 1, BIS 53.  Prop and fent infusing at 20 mcg and 200 mcg, respectively.   Nimbex gtt has not been initiated at this time.  VSS and patient is synchronous with the vent.  Dr Alva Garnet aware.  Initiate Nimbex when patient becomes asynchronous with vent and/or WOB increases

## 2017-10-22 NOTE — Progress Notes (Signed)
PULMONARY / CRITICAL CARE MEDICINE   Name: Julia Crawford MRN: [id number] DOB: Jul 15, 1935    ADMISSION DATE:  10/20/2017  PT PROFILE:   68 F never smoker with history of DM II, hypothyroidism, myasthenia and asthma admitted via ED with 2-3 days of increasing SOB. Initially required BiPAP in ED. Required intubation 11/12 for persistent bronchospasm, hypercarbia despite BiPAP  MAJOR EVENTS/TEST RESULTS: 11/11 Admitted with dx of acute/chronic hypercarbic respiratory failure due to asthma exacerbation 11/12 intubated 11/13 very tight bronchospasm with very prolonged expiratory time and very high PIPs with pt-vent dyssynchrony requiring NMB.  11/13 developed AF with very slow vent rate  INDWELLING DEVICES:: ETT 11/12 >>  L IJ CVL 11/12 >>   MICRO DATA: MRSA PCR 11/11 >> NEG Resp 11/12 >>  Blood 11/11 >>   ANTIMICROBIALS:      SUBJECTIVE:  Heavily sedated, intubated. Dyssynchrony with vent  VITAL SIGNS: BP (!) 107/54   Pulse (!) 53   Temp 97.9 F (36.6 C) (Oral)   Resp 18   Ht 5' (1.524 m)   Wt 94.8 kg (208 lb 15.9 oz)   SpO2 97%   BMI 40.82 kg/m   HEMODYNAMICS:    VENTILATOR SETTINGS: Vent Mode: PRVC FiO2 (%):  [30 %-50 %] 40 % Set Rate:  [10 bmp] 10 bmp Vt Set:  [500 mL] 500 mL PEEP:  [5 cmH20] 5 cmH20 Plateau Pressure:  [24 cmH20] 24 cmH20  INTAKE / OUTPUT: I/O last 3 completed shifts: In: 4727.5 [P.O.:100; I.V.:3876.7; NG/GT:700.8; IV Piggyback:50] Out: 925 [Urine:925]  PHYSICAL EXAMINATION: General: Intubated, sedated, dyssynchronous Neuro: CNs intact, MAEs HEENT: NCAT, sclerae white Cardiovascular: reg, no M Lungs: very prolonged expiratory wheezes Abdomen: obese, soft, diminished BS Ext: warm, no edema Skin: no lesions noted  LABS:  BMET Recent Labs  Lab 10/20/17 1514 10/21/17 0627 10/22/17 0205  NA 137 138 138  K 4.5 4.7 4.5  CL 95* 97* 101  CO2 30 31 29   BUN 10 15 25*  CREATININE 0.66 0.77 0.82  GLUCOSE 175* 197* 226*     Electrolytes Recent Labs  Lab 10/20/17 1514  10/20/17 2352 10/21/17 0627 10/21/17 1340 10/22/17 0205  CALCIUM 9.0  --   --  8.3*  --  7.8*  MG  --    < > 1.5* 1.5* 2.0 2.1  PHOS  --   --  4.6 4.7* 3.7  --    < > = values in this interval not displayed.    CBC Recent Labs  Lab 10/20/17 1514 10/22/17 0205  WBC 5.1 7.1  HGB 13.8 11.3*  HCT 42.0 34.6*  PLT 183 161    Coag's Recent Labs  Lab 10/20/17 1514  INR 0.92    Sepsis Markers No results for input(s): LATICACIDVEN, PROCALCITON, O2SATVEN in the last 168 hours.  ABG Recent Labs  Lab 10/20/17 2105 10/21/17 0021 10/21/17 1157  PHART 7.21* 7.25* 7.28*  PCO2ART 91* 76* 77*  PO2ART 78* 122* 495*    Liver Enzymes Recent Labs  Lab 10/20/17 1514  AST 23  ALT 9*  ALKPHOS 56  BILITOT 1.0  ALBUMIN 4.5    Cardiac Enzymes Recent Labs  Lab 10/20/17 1902 10/20/17 2352 10/21/17 0627  TROPONINI 0.05* 0.06* 0.05*    Glucose Recent Labs  Lab 10/21/17 1944 10/21/17 2347 10/22/17 0400 10/22/17 0739 10/22/17 0745 10/22/17 1140  GLUCAP 225* 197* 198* 186* 176* 196*    CXR: NAD   ASSESSMENT / PLAN:  PULMONARY A: Acute/chronic respiratory failure due to  status asthmaticus P:   Cont full vent support - settings reviewed and/or adjusted Cont vent bundle Daily SBT if/when meets criteria  Permissive hypercapnea Heavy sedation and NMB required Systemic steroid dose increased 11/13 Continue nebulized bronchodilators -changed albuterol to levalbuterol 11/13 Add nebulized steroids 11/13  CARDIOVASCULAR A:  New onset AF (11/13) P:  Monitor per ICU protocol  RENAL A:   No issues P:   Monitor BMET intermittently Monitor I/Os Correct electrolytes as indicated   GASTROINTESTINAL A:   Obesity P:   SUP: IV famotidine Cont TFs at trickle rate until off NMBs  HEMATOLOGIC A:   Macrocytic anemia P:  DVT px: SQ heparin Monitor CBC intermittently Transfuse per usual guidelines  Check  vitamin D level and RBC folate IV folate initiated 11/13  INFECTIOUS A:   No issues P:   Monitor temp, WBC count Micro and abx as above   ENDOCRINE A:   Type II DM - presently controlled P:   Continue SSI -changed to moderate scale 11/13  NEUROLOGIC A:   ICU/vent associated discomfort Patient-ventilator dyssynchrony P:   RASS goal: N/A while on NMB NMB protocol with propofol and fentany BIS goal 40-60   FAMILY: Family updated at bedside   CCM time: 45 mins The above time includes time spent in consultation with patient and/or family members and reviewing care plan on multidisciplinary rounds  Merton Border, MD PCCM service Mobile [phone] Pager [phone] 10/22/2017, 2:14 PM

## 2017-10-22 NOTE — Progress Notes (Signed)
RT called to room by Malachy Mood, RN. Patient very dyssynchronous with ventilator, ETCO2 in the 80's to 90's, and Airway pressures all elevated. Patient has very low exhales tidal volumes and patient very was very tight and had wheezes both inspiratory and expiratory, patient also had a very long expiratory time, patients was administered her 0800 dose of duoneb with no change in her status. Patients saturation stayed stable in he upper 90's throughout entire episode. RT and RN spoke to Dr.Simonds on telephone and orders were given, Vent settings left at [facility], 500, 10, +5, and 50%. RN administered dose of paralytic and patients airway pressures began to slowly improve along with her dyssynchrony. Patients  Expired tidal volumes began to increase as well.

## 2017-10-22 NOTE — Progress Notes (Signed)
Met with patient's daughter Mrs. Mat Carne at 1630pm. I discussed patient's current diagnoses; current difficulties ventilating her and possible prognosis. I explained to her that given patient's age and comorbidities, CPR at this point will not improve the patient's overall outcome; on the contrary CPR will significantly decrease the patient's potential for any meaningful recovery and  quality of life. Ms. Trinna Balloon agreed and indicated that patient did not want to be placed one life support or have CPR. She requested to speak with her siblings before making the decision. After speaking with her family, she called me to the patient's room and said it was ok to make patient DNR. DNR order placed in EMR. Support provided Will continue to monitor  Ayrton Mcvay S. [facility] ANP-BC Pulmonary and Critical Care Medicine [facility] Pager [phone] or [phone]

## 2017-10-22 NOTE — Progress Notes (Signed)
Per dr Mortimer Fries DC the order set for NMB, pt is comfortable and we will start versed gtt if needed.  No orders re low UOP at this time, EKG is done and pt is in AFib with slow vent response.  Propofol titrated down to 10 mcgs to address.  No other orders at this time

## 2017-10-23 ENCOUNTER — Inpatient Hospital Stay: Payer: Medicare Other

## 2017-10-23 LAB — CBC
HCT: 35.3 % (ref 35.0–47.0)
HEMOGLOBIN: 11.4 g/dL — AB (ref 12.0–16.0)
MCH: 34.2 pg — AB (ref 26.0–34.0)
MCHC: 32.3 g/dL (ref 32.0–36.0)
MCV: 105.8 fL — ABNORMAL HIGH (ref 80.0–100.0)
PLATELETS: 162 10*3/uL (ref 150–440)
RBC: 3.34 MIL/uL — AB (ref 3.80–5.20)
RDW: 17.2 % — ABNORMAL HIGH (ref 11.5–14.5)
WBC: 6.5 10*3/uL (ref 3.6–11.0)

## 2017-10-23 LAB — COMPREHENSIVE METABOLIC PANEL
ALBUMIN: 3.2 g/dL — AB (ref 3.5–5.0)
ALK PHOS: 32 U/L — AB (ref 38–126)
ALT: 10 U/L — AB (ref 14–54)
AST: 25 U/L (ref 15–41)
Anion gap: 9 (ref 5–15)
BUN: 38 mg/dL — AB (ref 6–20)
CALCIUM: 8.4 mg/dL — AB (ref 8.9–10.3)
CHLORIDE: 101 mmol/L (ref 101–111)
CO2: 28 mmol/L (ref 22–32)
CREATININE: 0.68 mg/dL (ref 0.44–1.00)
GFR calc non Af Amer: >60 mL/min (ref >60)
GLUCOSE: 231 mg/dL — AB (ref 65–99)
Potassium: 4.5 mmol/L (ref 3.5–5.1)
SODIUM: 138 mmol/L (ref 135–145)
Total Bilirubin: 0.7 mg/dL (ref 0.3–1.2)
Total Protein: 6.6 g/dL (ref 6.5–8.1)

## 2017-10-23 LAB — GLUCOSE, CAPILLARY
Glucose-Capillary: 265 mg/dL — ABNORMAL HIGH (ref 65–99)
Glucose-Capillary: 229 mg/dL — ABNORMAL HIGH (ref 65–99)
Glucose-Capillary: 167 mg/dL — ABNORMAL HIGH (ref 65–99)
Glucose-Capillary: 232 mg/dL — ABNORMAL HIGH (ref 65–99)
Glucose-Capillary: 207 mg/dL — ABNORMAL HIGH (ref 65–99)
Glucose-Capillary: 166 mg/dL — ABNORMAL HIGH (ref 65–99)

## 2017-10-23 LAB — TROPONIN I: TROPONIN I: 0.04 ng/mL — AB (ref <0.03)

## 2017-10-23 LAB — VITAMIN B12: Vitamin B-12: 1252 pg/mL — ABNORMAL HIGH (ref 180–914)

## 2017-10-23 MED ORDER — INSULIN GLARGINE 100 UNIT/ML ~~LOC~~ SOLN
15.0000 [IU] | Freq: Every day | SUBCUTANEOUS | Status: DC
  Administered 2017-10-23 – 2017-10-24 (×2): 15 [IU] via SUBCUTANEOUS
  Filled 2017-10-23 (×3): qty 0.15

## 2017-10-23 MED ORDER — FAMOTIDINE 20 MG PO TABS
20.0000 mg | ORAL_TABLET | Freq: Two times a day (BID) | ORAL | Status: DC
  Administered 2017-10-23 – 2017-10-28 (×10): 20 mg
  Filled 2017-10-23 (×10): qty 1

## 2017-10-23 MED ORDER — FOLIC ACID 1 MG PO TABS
1.0000 mg | ORAL_TABLET | Freq: Every day | ORAL | Status: DC
  Administered 2017-10-24: 1 mg
  Filled 2017-10-23: qty 1

## 2017-10-23 MED ORDER — ENOXAPARIN SODIUM 100 MG/ML ~~LOC~~ SOLN
1.0000 mg/kg | Freq: Two times a day (BID) | SUBCUTANEOUS | Status: DC
  Administered 2017-10-23 – 2017-10-25 (×5): 90 mg via SUBCUTANEOUS
  Filled 2017-10-23 (×6): qty 1

## 2017-10-23 NOTE — Progress Notes (Signed)
[facility] at [place] NAME: Julia Crawford    MR#:  [id number]  DATE OF BIRTH:  14-Sep-1935  SUBJECTIVE:  CHIEF COMPLAINT:   Chief Complaint  Patient presents with  . Respiratory Distress    Came with respi distress. Was on Bipap but worsening, so intubated.   Remains on vent support.  REVIEW OF SYSTEMS:  Intubated. ROS  DRUG ALLERGIES:   Allergies  Allergen Reactions  . Ace Inhibitors Cough    Other reaction(s): Unknown  . Apixaban     Other reaction(s): Unknown  . Azithromycin     Myasthenia Gravis Patient  . Butylene Glycol Other (See Comments)  . Codeine Itching and Swelling  . Doxycycline   . Lasix [Furosemide] Other (See Comments)    Chest and back pain  . Peanut Butter Flavor     rash?  . Telithromycin     Other reaction(s): Other (See Comments) Myasthenia Gravis Patient    VITALS:  Blood pressure (!) 120/57, pulse 64, temperature 98.8 F (37.1 C), temperature source Oral, resp. rate 20, height 5' (1.524 m), weight 92.2 kg (203 lb 4.2 oz), SpO2 99 %.  PHYSICAL EXAMINATION:  GENERAL:  82 y.o.-year-old patient lying in the bed , critical appearance.  EYES: Pupils equal, round, reactive to light and accommodation. No scleral icterus. Extraocular muscles intact.  HEENT: Head atraumatic, normocephalic. Oropharynx and nasopharynx clear.  NECK:  Supple, no jugular venous distention. No thyroid enlargement, no tenderness.  LUNGS: Normal breath sounds bilaterally, wheezing, no crepitation. ETT , on vent support. CARDIOVASCULAR: S1, S2 normal. No murmurs, rubs, or gallops.  ABDOMEN: Soft, nontender, nondistended. Bowel sounds present. No organomegaly or mass.  EXTREMITIES: No pedal edema, cyanosis, or clubbing.  NEUROLOGIC: sedated on vent support.  PSYCHIATRIC: sedated on vent support.  SKIN: No obvious rash, lesion, or ulcer.   Physical Exam LABORATORY PANEL:   CBC Recent Labs  Lab 10/23/17 0426  WBC 6.5  HGB 11.4*   HCT 35.3  PLT 162   ------------------------------------------------------------------------------------------------------------------  Chemistries  Recent Labs  Lab 10/22/17 0205 10/23/17 0426  NA 138 138  K 4.5 4.5  CL 101 101  CO2 29 28  GLUCOSE 226* 231*  BUN 25* 38*  CREATININE 0.82 0.68  CALCIUM 7.8* 8.4*  MG 2.1  --   AST  --  25  ALT  --  10*  ALKPHOS  --  32*  BILITOT  --  0.7   ------------------------------------------------------------------------------------------------------------------  Cardiac Enzymes Recent Labs  Lab 10/22/17 1428 10/23/17 0225  TROPONINI 0.05* 0.04*   ------------------------------------------------------------------------------------------------------------------  RADIOLOGY:  Dg Abd 1 View  Result Date: 10/21/2017 CLINICAL DATA:  Orogastric tube placement EXAM: ABDOMEN - 1 VIEW COMPARISON:  None. FINDINGS: Orogastric tube enters the abdomen in has its tip in the region of the gastric antrum. IMPRESSION: OGT tip gastric antrum. Electronically Signed   By: Nelson Chimes M.D.   On: 10/21/2017 11:44   Dg Chest Port 1 View  Result Date: 10/23/2017 CLINICAL DATA:  Hypoxia EXAM: PORTABLE CHEST 1 VIEW COMPARISON:  October 22, 2017 FINDINGS: Endotracheal tube tip is 1.0 cm above the carina. Central catheter tip is in superior vena cava. Nasogastric tube tip and side port below the diaphragm. No pneumothorax. There is no edema or consolidation. There is cardiomegaly with mild pulmonary venous hypertension. No adenopathy. No evident bone lesions. There is aortic atherosclerosis. IMPRESSION: Tube and catheter positions as described without pneumothorax. Note that the endotracheal tube tip is near  the carina ; suggest withdrawing endotracheal tube approximately 2 cm. Pulmonary vascular congestion without frank edema or consolidation. There is aortic atherosclerosis. Aortic Atherosclerosis (ICD10-I70.0). Electronically Signed   By: Lowella Grip  III M.D.   On: 10/23/2017 07:10   Dg Chest Port 1 View  Result Date: 10/22/2017 CLINICAL DATA:  Shortness of breath. EXAM: PORTABLE CHEST 1 VIEW COMPARISON:  10/21/2017 . FINDINGS: Endotracheal tube, NG tube, left IJ line stable position. Stable cardiomegaly. Low lung volumes with basilar atelectasis. Interim improvement of small left pleural effusion. IMPRESSION: 1. Lines and tubes in stable position. 2. Stable cardiomegaly. 3. Mild basilar atelectasis. Interim improvement of small left pleural effusion . Electronically Signed   By: Marcello Moores  Register   On: 10/22/2017 08:01   Dg Chest Port 1 View  Result Date: 10/21/2017 CLINICAL DATA:  Central line placement EXAM: PORTABLE CHEST 1 VIEW COMPARISON:  10/20/2017 FINDINGS: Endotracheal tube is near the carina, 10 mm above the carina. Left central line tip is at the cavoatrial junction. NG tube enters the stomach. Cardiomegaly with vascular congestion. No confluent opacities. Possible small layering left effusion. No acute bony abnormality. IMPRESSION: Endotracheal tube is near the carina, 10 mm above the carina. Left central line tip at the cavoatrial junction. No pneumothorax. Cardiomegaly, vascular congestion. Suspect small layering left effusion. Electronically Signed   By: Rolm Baptise M.D.   On: 10/21/2017 11:42    ASSESSMENT AND PLAN:   Active Problems:   Acute on chronic respiratory failure with hypercapnia (HCC)   Dyspnea  1.  Respiratory failure: Acute on chronic; with hypoxia and hypercapnia.     Status asthamaticus Was on BiPAP.    Worsening, so intubated by Dr. Mortimer Fries.   Cont steroids, nebs and Abx stopped per ICU team. 2.  CAD: Stable; continue aspirin 3.  Atrial fibrillation: Rate controlled; continue diltiazem 4.  CHF: Systolic; chronic.  Continue Lasix per home regimen.  BNP is only mildly elevated.  No rales on physical exam. 5.  Diabetes mellitus type 2: keep on ISS. 6. Hyperlipidemia: Continue statin therapy 7.  Myasthenia  gravis: Stable; continue pyridostigmine 5 8.  Depression: Continue sertraline 9.  DVT prophylaxis: Lovenox 10.  GI prophylaxis: None   All the records are reviewed and case discussed with Care Management/Social Workerr. Management plans discussed with the patient, family and they are in agreement.  CODE STATUS: Full.  TOTAL TIME TAKING CARE OF THIS PATIENT: 35 minutes.   POSSIBLE D/C IN 2-3 DAYS, DEPENDING ON CLINICAL CONDITION.   Vaughan Basta M.D on 10/23/2017   Between 7am to 6pm - Pager - [phone]  After 6pm go to [email or web address] - password [email or web address] [facility]  Office  [phone]  CC: Primary care physician; Jerrol Banana., MD  Note: This dictation was prepared with Dragon dictation along with smaller phrase technology. Any transcriptional errors that result from this process are unintentional.

## 2017-10-23 NOTE — Progress Notes (Signed)
[place] for Lovenox Indication: atrial fibrillation  Allergies  Allergen Reactions  . Ace Inhibitors Cough    Other reaction(s): Unknown  . Apixaban     Other reaction(s): Unknown  . Azithromycin     Myasthenia Gravis Patient  . Butylene Glycol Other (See Comments)  . Codeine Itching and Swelling  . Doxycycline   . Lasix [Furosemide] Other (See Comments)    Chest and back pain  . Peanut Butter Flavor     rash?  . Telithromycin     Other reaction(s): Other (See Comments) Myasthenia Gravis Patient    Patient Measurements: Height: 5' (152.4 cm) Weight: 203 lb 4.2 oz (92.2 kg) IBW/kg (Calculated) : 45.5  Vital Signs: Temp: 97.5 F (36.4 C) (11/14 0800) Temp Source: Oral (11/14 0800) BP: 145/93 (11/14 0800) Pulse Rate: 68 (11/14 0800)  Labs: Recent Labs    10/20/17 1514  10/21/17 0627 10/22/17 0205 10/22/17 1428 10/23/17 0225 10/23/17 0426  HGB 13.8  --   --  11.3*  --   --  11.4*  HCT 42.0  --   --  34.6*  --   --  35.3  PLT 183  --   --  161  --   --  162  LABPROT 12.3  --   --   --   --   --   --   INR 0.92  --   --   --   --   --   --   CREATININE 0.66  --  0.77 0.82  --   --  0.68  TROPONINI 0.03*   < > 0.05*  --  0.05* 0.04*  --    < > = values in this interval not displayed.    Estimated Creatinine Clearance: 56.8 mL/min (by C-G formula based on SCr of 0.68 mg/dL).   Medical History: Past Medical History:  Diagnosis Date  . Arthritis   . Asthma   . Atrial fibrillation ([place]) 04/14/2015  . COPD (chronic obstructive pulmonary disease) ([place])   . Coronary artery disease   . Diabetes mellitus without complication ([place])   . Emphysema of lung ([place])   . GERD (gastroesophageal reflux disease)   . Hypothyroid   . Irregular heart beat   . Myasthenia gravis [facility]     Assessment: 82 y/o F ventilated for asthma exacerbation with new-onset atrial fibrillation.   CHA2DS2/VAS Stroke Risk Points      6 >= 2 Points:  High Risk  1 - 1.99 Points: Medium Risk  0 Points: Low Risk    This is the only CHA2DS2/VAS Stroke Risk Points available for the past  year.:  Change: N/A     Details    This score determines the patient's risk of having a stroke if the  patient has atrial fibrillation.       Points Metrics  1 Has Congestive Heart Failure:  Yes   0 Has Vascular Disease:  No   1 Has Hypertension:  Yes   2 Age:  54   1 Has Diabetes:  Yes   0 Had Stroke:  No  Had TIA:  No  Had thromboembolism:  No   1 Female:  Yes      Goal of Theray:  Monitor platelets by anticoagulation protocol: Yes   Plan:  Lovenox 1 mg/kg (90 mg) q 12 hours.   Ulice Dash D 10/23/2017,9:34 AM

## 2017-10-23 NOTE — Progress Notes (Signed)
Pt with brown/tan fluid in ETT - notied at [place].  Tube feeds held.  Notified [place], NP at [place].  Tube feeds discontinued and place to LIWS.  CXR also ordered.

## 2017-10-23 NOTE — Progress Notes (Signed)
Inpatient Diabetes Program Recommendations  AACE/ADA: New Consensus Statement on Inpatient Glycemic Control (2015)  Target Ranges:  Prepandial:   less than 140 mg/dL      Peak postprandial:   less than 180 mg/dL (1-2 hours)      Critically ill patients:  140 - 180 mg/dL  Results for KENNEDEE, KITZMILLER (MRN [id number]) as of 10/23/2017 10:05  Ref. Range 10/22/2017 07:45 10/22/2017 11:40 10/22/2017 16:16 10/22/2017 16:36 10/22/2017 19:14 10/22/2017 23:37 10/23/2017 03:54 10/23/2017 07:25  Glucose-Capillary Latest Ref Range: 65 - 99 mg/dL 176 (H) 196 (H) 217 (H) 219 (H) 221 (H) 264 (H) 265 (H) 229 (H)    Review of Glycemic Control  Diabetes history: DM2 Outpatient Diabetes medications: Metformin 500 mg BID Current orders for Inpatient glycemic control: Novolog 0-15 units Q4H; Solumedrol 80 Q12H  Inpatient Diabetes Program Recommendations:  Insulin - IV drip/GlucoStabilizer: Please consider discontinuing current Novolog correction scale and use ICU Glycemic Control order set to order Phase 2 IV insulin to improve glycemic control and determin insulin needs.  Thanks, Barnie Alderman, RN, MSN, CDE Diabetes Coordinator Inpatient Diabetes Program [phone] (Team Pager from 8am to 5pm)

## 2017-10-23 NOTE — Plan of Care (Signed)
Patient  remains on ventilator with fi02 at 40%. Propofol and fentanyl drips infusing, patient able to follow commands. VSS, patient showing no signs of pain at present. Patient resting in bed with no signs of acute distress at present, will continue to monitor.

## 2017-10-23 NOTE — Progress Notes (Signed)
PULMONARY / CRITICAL CARE MEDICINE   Name: Julia Crawford MRN: [id number] DOB: Jun 02, 1935    ADMISSION DATE:  10/20/2017  PT PROFILE:   6 F never smoker with history of DM II, hypothyroidism, myasthenia and asthma admitted via ED with 2-3 days of increasing SOB. Initially required BiPAP in ED. Required intubation 11/12 for persistent bronchospasm, hypercarbia despite BiPAP  MAJOR EVENTS/TEST RESULTS: 11/11 Admitted with dx of acute/chronic hypercarbic respiratory failure due to asthma exacerbation 11/12 intubated 11/13 very tight bronchospasm with very prolonged expiratory time and very high PIPs with pt-vent dyssynchrony requiring NMB.  11/13 developed AF with very slow vent rate. Made DNR in event of cardiac arrest 11/14 Severe but improved bronchospasm. Able to F/C.   INDWELLING DEVICES:: ETT 11/12 >>  L IJ CVL 11/12 >>   MICRO DATA: MRSA PCR 11/11 >> NEG Resp 11/12 >>  Blood 11/11 >>   ANTIMICROBIALS:      SUBJECTIVE:  Sedated but able to F/C, intubated. Synchronous. Off NMBs  VITAL SIGNS: BP 128/79   Pulse 63   Temp 99.1 F (37.3 C) (Oral)   Resp 15   Ht 5' (1.524 m)   Wt 92.2 kg (203 lb 4.2 oz)   SpO2 100%   BMI 39.70 kg/m   HEMODYNAMICS:    VENTILATOR SETTINGS: Vent Mode: PRVC FiO2 (%):  [40 %-50 %] 40 % Set Rate:  [10 bmp-20 bmp] 20 bmp Vt Set:  [500 mL] 500 mL PEEP:  [5 cmH20] 5 cmH20  INTAKE / OUTPUT: I/O last 3 completed shifts: In: 3743.3 [I.V.:2045.8; NG/GT:1597.5; IV Piggyback:100] Out: 945 [Urine:945]  PHYSICAL EXAMINATION: General: Intubated, sedated, RASS -1, + F/C, synchronous Neuro: CNs intact, MAEs HEENT: NCAT, sclerae white Cardiovascular: reg, no M Lungs: prolonged coarse expiratory wheezes Abdomen: obese, soft, diminished BS Ext: warm, no edema Skin: no lesions noted  LABS:  BMET Recent Labs  Lab 10/21/17 0627 10/22/17 0205 10/23/17 0426  NA 138 138 138  K 4.7 4.5 4.5  CL 97* 101 101  CO2 31 29 28   BUN 15 25* 38*   CREATININE 0.77 0.82 0.68  GLUCOSE 197* 226* 231*    Electrolytes Recent Labs  Lab 10/20/17 2352 10/21/17 0627 10/21/17 1340 10/22/17 0205 10/23/17 0426  CALCIUM  --  8.3*  --  7.8* 8.4*  MG 1.5* 1.5* 2.0 2.1  --   PHOS 4.6 4.7* 3.7  --   --     CBC Recent Labs  Lab 10/20/17 1514 10/22/17 0205 10/23/17 0426  WBC 5.1 7.1 6.5  HGB 13.8 11.3* 11.4*  HCT 42.0 34.6* 35.3  PLT 183 161 162    Coag's Recent Labs  Lab 10/20/17 1514  INR 0.92    Sepsis Markers No results for input(s): LATICACIDVEN, PROCALCITON, O2SATVEN in the last 168 hours.  ABG Recent Labs  Lab 10/21/17 0021 10/21/17 1157 10/22/17 2000  PHART 7.25* 7.28* 7.49*  PCO2ART 76* 77* 40  PO2ART 122* 495* 154*    Liver Enzymes Recent Labs  Lab 10/20/17 1514 10/23/17 0426  AST 23 25  ALT 9* 10*  ALKPHOS 56 32*  BILITOT 1.0 0.7  ALBUMIN 4.5 3.2*    Cardiac Enzymes Recent Labs  Lab 10/21/17 0627 10/22/17 1428 10/23/17 0225  TROPONINI 0.05* 0.05* 0.04*    Glucose Recent Labs  Lab 10/22/17 1636 10/22/17 1914 10/22/17 2337 10/23/17 0354 10/23/17 0725 10/23/17 1137  GLUCAP 219* 221* 264* 265* 229* 167*    CXR: [place]   ASSESSMENT / PLAN:  PULMONARY A:  Acute/chronic respiratory failure Status asthmaticus P:   Cont full vent support - settings reviewed and/or adjusted Cont vent bundle Daily SBT if/when meets criteria  Cont systemic steroid - dose increased 11/13 Continue nebulized bronchodilators -changed albuterol to levalbuterol 11/13 Cont nebulized steroids 11/13  CARDIOVASCULAR A:  New onset AF (11/13) P:  Monitor per ICU protocol Change to full dose anticoagulation  RENAL A:   No issues P:   Monitor BMET intermittently Monitor I/Os Correct electrolytes as indicated   GASTROINTESTINAL A:   Obesity P:   SUP: IV famotidine Cont TFs at trickle rate until off NMBs  HEMATOLOGIC A:   Macrocytic anemia - normal serum vitamin B12 P:  DVT px: Full dose  enoxaparin (for AF) Monitor CBC intermittently Transfuse per usual guidelines  F/U RBC folate - sent 11/13 Cont folate supplementation  INFECTIOUS A:   No issues P:   Monitor temp, WBC count Micro and abx as above   ENDOCRINE A:   Type II DM - not adequately controlled P:   Continue SSI -changed to moderate scale 11/13 Add Lantus 11/14  NEUROLOGIC A:   ICU/vent associated discomfort Patient-ventilator dyssynchrony P:   RASS goal: N-1, -2 PAD protocol - propofol, PRN fentanyl   FAMILY: Daughter updated at bedside   CCM time: 35 mins The above time includes time spent in consultation with patient and/or family members and reviewing care plan on multidisciplinary rounds  Merton Border, MD PCCM service Mobile [phone] Pager [phone] 10/23/2017, 1:24 PM

## 2017-10-24 ENCOUNTER — Inpatient Hospital Stay: Payer: Medicare Other

## 2017-10-24 DIAGNOSIS — J411 Mucopurulent chronic bronchitis: Secondary | ICD-10-CM

## 2017-10-24 LAB — GLUCOSE, CAPILLARY
Glucose-Capillary: 132 mg/dL — ABNORMAL HIGH (ref 65–99)
Glucose-Capillary: 144 mg/dL — ABNORMAL HIGH (ref 65–99)
Glucose-Capillary: 213 mg/dL — ABNORMAL HIGH (ref 65–99)
Glucose-Capillary: 258 mg/dL — ABNORMAL HIGH (ref 65–99)
Glucose-Capillary: 218 mg/dL — ABNORMAL HIGH (ref 65–99)

## 2017-10-24 LAB — FOLATE RBC
FOLATE, RBC: 1020 ng/mL (ref >498)
Folate, Hemolysate: 343.7 ng/mL
Hematocrit: 33.7 % — ABNORMAL LOW (ref 34.0–46.6)

## 2017-10-24 LAB — BASIC METABOLIC PANEL
ANION GAP: 8 (ref 5–15)
BUN: 25 mg/dL — ABNORMAL HIGH (ref 6–20)
CALCIUM: 8.3 mg/dL — AB (ref 8.9–10.3)
CO2: 29 mmol/L (ref 22–32)
Chloride: 103 mmol/L (ref 101–111)
Creatinine, Ser: 0.49 mg/dL (ref 0.44–1.00)
GLUCOSE: 150 mg/dL — AB (ref 65–99)
POTASSIUM: 4.4 mmol/L (ref 3.5–5.1)
Sodium: 140 mmol/L (ref 135–145)

## 2017-10-24 LAB — CULTURE, RESPIRATORY
CULTURE: NORMAL
SPECIAL REQUESTS: NORMAL

## 2017-10-24 LAB — CBC
HCT: 34.1 % — ABNORMAL LOW (ref 35.0–47.0)
HEMOGLOBIN: 11.2 g/dL — AB (ref 12.0–16.0)
MCH: 34.1 pg — AB (ref 26.0–34.0)
MCHC: 32.7 g/dL (ref 32.0–36.0)
MCV: 104.3 fL — AB (ref 80.0–100.0)
PLATELETS: 163 10*3/uL (ref 150–440)
RBC: 3.27 MIL/uL — AB (ref 3.80–5.20)
RDW: 17 % — ABNORMAL HIGH (ref 11.5–14.5)
WBC: 6 10*3/uL (ref 3.6–11.0)

## 2017-10-24 LAB — CULTURE, RESPIRATORY W GRAM STAIN

## 2017-10-24 MED ORDER — METHYLPREDNISOLONE SODIUM SUCC 40 MG IJ SOLR
40.0000 mg | Freq: Two times a day (BID) | INTRAMUSCULAR | Status: DC
  Administered 2017-10-24 – 2017-10-28 (×8): 40 mg via INTRAVENOUS
  Filled 2017-10-24 (×8): qty 1

## 2017-10-24 MED ORDER — FENTANYL CITRATE (PF) 100 MCG/2ML IJ SOLN
25.0000 ug | INTRAMUSCULAR | Status: DC | PRN
  Administered 2017-10-24 – 2017-10-25 (×4): 100 ug via INTRAVENOUS
  Filled 2017-10-24 (×4): qty 2

## 2017-10-24 MED ORDER — CHLORHEXIDINE GLUCONATE 0.12 % MT SOLN
OROMUCOSAL | Status: AC
  Administered 2017-10-24: 09:00:00
  Filled 2017-10-24: qty 15

## 2017-10-24 MED ORDER — VITAL HIGH PROTEIN PO LIQD
1000.0000 mL | ORAL | Status: DC
  Administered 2017-10-25 – 2017-10-27 (×3): 1000 mL

## 2017-10-24 MED ORDER — DILTIAZEM HCL 100 MG IV SOLR
5.0000 mg/h | INTRAVENOUS | Status: DC
  Administered 2017-10-24: 5 mg/h via INTRAVENOUS
  Filled 2017-10-24: qty 100

## 2017-10-24 MED ORDER — DEXTROSE 5 % IV SOLN
1.0000 g | INTRAVENOUS | Status: DC
  Administered 2017-10-24: 1 g via INTRAVENOUS
  Filled 2017-10-24 (×3): qty 10

## 2017-10-24 MED ORDER — VITAL HIGH PROTEIN PO LIQD
1000.0000 mL | ORAL | Status: DC
  Administered 2017-10-24: 1000 mL

## 2017-10-24 MED ORDER — PRO-STAT SUGAR FREE PO LIQD
30.0000 mL | Freq: Two times a day (BID) | ORAL | Status: DC
  Administered 2017-10-24 – 2017-10-28 (×9): 30 mL

## 2017-10-24 MED ORDER — SENNOSIDES-DOCUSATE SODIUM 8.6-50 MG PO TABS
1.0000 | ORAL_TABLET | Freq: Two times a day (BID) | ORAL | Status: DC
  Administered 2017-10-24 – 2017-10-25 (×2): 1
  Filled 2017-10-24 (×2): qty 1

## 2017-10-24 NOTE — Progress Notes (Signed)
1800 Good day. Weaned pressure support but patient still unable to wean from ventilator. Fentanyl drip discontinued. More alert. Started on Cardizem drip for heart rate increase. Heart rate decreased to 60s-70s.  Diprivan increased to 15 Mcg/kg/min to lessen awareness. Much better this pm. Lung sounds very tight. Tube feeding restarted per Dr. Leonidas Romberg.  Sputum still white in color. Several runs of V-tach on the  Heart monitor. MD aware.

## 2017-10-24 NOTE — Progress Notes (Signed)
Nutrition Follow-up  DOCUMENTATION CODES:   Obesity unspecified  INTERVENTION:  Recommend new goal regimen of Vital High Protein at 30 mL/hr (720 mL goal daily volume) + Pro-Stat 30 mL BID via OGT. Provides 920 kcal, 93 grams of protein, 605 mL H2O daily. With current propofol rate provides 1221 kcal daily.   Continue free water flush per MD. With current order of 200 mL Q8hrs patient is receiving 1205 mL H2O daily including water in tube feeds.  Continue liquid MVI daily per tube as goal regimen does not meet 100% RDIs.  Consider more aggressive bowel regimen as patient has not yet had a bowel movement this admission.  NUTRITION DIAGNOSIS:   Inadequate oral intake related to inability to eat as evidenced by NPO status(patient currently intubated).  Ongoing - addressing with tube feeds.  GOAL:   Provide needs based on ASPEN/SCCM guidelines  Met with tube feed regimen.  MONITOR:   Vent status, Labs, Weight trends, TF tolerance, I & O's  REASON FOR ASSESSMENT:   Ventilator, Consult Enteral/tube feeding initiation and management  ASSESSMENT:   82 year old female with PMHx of CAD, COPD, arthritis, CHF, DM type 2, myasthenia gravis, hypothyroidism, A-fib, GERD, emphysema of lung, hx of partial hysterectomy who presented with shortness of breath who presented with acute on chronic respiratory failure with hypoxia and hypercapnia and required emergent intubation on 11/12 after failing BiPAP.  Two family members present at time of RD assessment this morning. Patient was also alert on ventilator. She denies any abdominal pain or nausea (by shaking head no). Abdomen soft and not distended on exam. Noted some mild pitting edema on exam. Patient has not had a bowel movement this admission.  Access: OGT placed 11/12; verified to terminate in gastric antrum on abdominal x-ray 11/12; 68 cm at corner of mouth  MAP: 70-90 mmHg; not on any pressors  TF: patient was started back to VHP at  50 mL/hr on 11/14 so Pro-Stat was discontinued; overnight tube feeds were stopped due to brown/tan liquid being found in ETT; restarted this AM with VHP at 40 mL/hr;  ordered for FWF of 200 mL Q8hrs  Patient is currently intubated on ventilator support MV: 7.5 L/min Temp (24hrs), Avg:98.5 F (36.9 C), Min:97.8 F (36.6 C), Max:98.9 F (37.2 C)  Propofol: 11.4 ml/hr (301 kcal daily)  Medications reviewed and include: Colace 100 mg BID per tube, famotidine, folic acid 1 mg daily per tube, Novolog 0-15 units Q4hrs, Lantus 15 units daily, levothyroxine, methylprednisolone 40 mg Q12hrs, liquid MVI daily per tube, diltiazem gtt, propofol gtt.  Labs reviewed: CBG 132-166, BUN 25. Vitamin B12 was 1252 on 11/14. Still pending RBC Folate.  I/O: 1020 mL UOP yesterday (0.4 mL/kg/hr); 600 mL output from OGT overnight  Weight trend: 96.3 kg on 11/15; wt trending up; +5.4 kg from admission wt  Discussed with RN.  Diet Order:  No diet orders on file  EDUCATION NEEDS:   No education needs have been identified at this time  Skin:  Skin Assessment: Reviewed RN Assessment(no issues)  Last BM:  10/20/2017  Height:   Ht Readings from Last 1 Encounters:  10/20/17 5' (1.524 m)    Weight:   Wt Readings from Last 1 Encounters:  10/24/17 212 lb 4.9 oz (96.3 kg)    Ideal Body Weight:  45.5 kg  BMI:  Body mass index is 41.46 kg/m.  Estimated Nutritional Needs:   Kcal:  1000-1273 (11-14 kcal/kg)  Protein:  >/= 91 grams (>/= 2  grams/kg IBW)  Fluid:  1.4-1.6 L/day (30-35 mL/kg IBW)  Willey Blade, MS, RD, LDN Office: [phone] Pager: [phone] After Hours/Weekend Pager: [phone]

## 2017-10-24 NOTE — Progress Notes (Signed)
[facility] at [place] NAME: Julia Crawford    MR#:  [id number]  DATE OF BIRTH:  02-Jan-1935  SUBJECTIVE:  CHIEF COMPLAINT:   Chief Complaint  Patient presents with  . Respiratory Distress    Came with respi distress. Was on Bipap but worsening, so intubated.   Remains on vent support. More secretions, had A fib with RVR.  REVIEW OF SYSTEMS:  Intubated. ROS  DRUG ALLERGIES:   Allergies  Allergen Reactions  . Ace Inhibitors Cough    Other reaction(s): Unknown  . Apixaban     Other reaction(s): Unknown  . Azithromycin     Myasthenia Gravis Patient  . Butylene Glycol Other (See Comments)  . Codeine Itching and Swelling  . Doxycycline   . Lasix [Furosemide] Other (See Comments)    Chest and back pain  . Peanut Butter Flavor     rash?  . Telithromycin     Other reaction(s): Other (See Comments) Myasthenia Gravis Patient    VITALS:  Blood pressure (!) 128/94, pulse (!) 49, temperature 97.8 F (36.6 C), resp. rate 15, height 5' (1.524 m), weight 96.3 kg (212 lb 4.9 oz), SpO2 94 %.  PHYSICAL EXAMINATION:  GENERAL:  82 y.o.-year-old patient lying in the bed , critical appearance.  EYES: Pupils equal, round, reactive to light and accommodation. No scleral icterus. Extraocular muscles intact.  HEENT: Head atraumatic, normocephalic. Oropharynx and nasopharynx clear.  NECK:  Supple, no jugular venous distention. No thyroid enlargement, no tenderness.  LUNGS: Normal breath sounds bilaterally, wheezing, no crepitation. ETT , on vent support. CARDIOVASCULAR: S1, S2 normal. No murmurs, rubs, or gallops.  ABDOMEN: Soft, nontender, nondistended. Bowel sounds present. No organomegaly or mass.  EXTREMITIES: No pedal edema, cyanosis, or clubbing.  NEUROLOGIC: sedated on vent support.  PSYCHIATRIC: sedated on vent support.  SKIN: No obvious rash, lesion, or ulcer.   Physical Exam LABORATORY PANEL:   CBC Recent Labs  Lab 10/24/17 0356  WBC  6.0  HGB 11.2*  HCT 34.1*  PLT 163   ------------------------------------------------------------------------------------------------------------------  Chemistries  Recent Labs  Lab 10/22/17 0205 10/23/17 0426 10/24/17 0356  NA 138 138 140  K 4.5 4.5 4.4  CL 101 101 103  CO2 29 28 29   GLUCOSE 226* 231* 150*  BUN 25* 38* 25*  CREATININE 0.82 0.68 0.49  CALCIUM 7.8* 8.4* 8.3*  MG 2.1  --   --   AST  --  25  --   ALT  --  10*  --   ALKPHOS  --  32*  --   BILITOT  --  0.7  --    ------------------------------------------------------------------------------------------------------------------  Cardiac Enzymes Recent Labs  Lab 10/22/17 1428 10/23/17 0225  TROPONINI 0.05* 0.04*   ------------------------------------------------------------------------------------------------------------------  RADIOLOGY:  Dg Chest Port 1 View  Result Date: 10/24/2017 CLINICAL DATA:  Acute onset of respiratory failure. EXAM: PORTABLE CHEST 1 VIEW COMPARISON:  Chest radiograph performed 10/23/2017 FINDINGS: The patient's endotracheal tube is seen ending 2 cm above the carina. An enteric tube is noted extending below the diaphragm. A left IJ line is noted ending about the mid SVC. Mild left basilar opacity may reflect atelectasis or possibly mild infection. Peribronchial thickening is noted. No pleural effusion or pneumothorax is seen. The cardiomediastinal silhouette is mildly enlarged. No acute osseous abnormalities are identified. IMPRESSION: 1. Endotracheal tube seen ending 2 cm above the carina. 2. Mild left basilar airspace opacity may reflect atelectasis or possibly mild infection. Peribronchial thickening noted. 3.  Mild cardiomegaly. Electronically Signed   By: Garald Balding M.D.   On: 10/24/2017 05:39   Dg Chest Port 1 View  Result Date: 10/23/2017 CLINICAL DATA:  Hypoxia EXAM: PORTABLE CHEST 1 VIEW COMPARISON:  October 22, 2017 FINDINGS: Endotracheal tube tip is 1.0 cm above the  carina. Central catheter tip is in superior vena cava. Nasogastric tube tip and side port below the diaphragm. No pneumothorax. There is no edema or consolidation. There is cardiomegaly with mild pulmonary venous hypertension. No adenopathy. No evident bone lesions. There is aortic atherosclerosis. IMPRESSION: Tube and catheter positions as described without pneumothorax. Note that the endotracheal tube tip is near the carina ; suggest withdrawing endotracheal tube approximately 2 cm. Pulmonary vascular congestion without frank edema or consolidation. There is aortic atherosclerosis. Aortic Atherosclerosis (ICD10-I70.0). Electronically Signed   By: Lowella Grip III M.D.   On: 10/23/2017 07:10    ASSESSMENT AND PLAN:   Active Problems:   Acute on chronic respiratory failure with hypercapnia (HCC)   Dyspnea  1.  Respiratory failure: Acute on chronic; with hypoxia and hypercapnia.     Status asthamaticus, tracheobronchitis Was on BiPAP.    Worsening, so intubated by Dr. Mortimer Fries.   Cont steroids, nebs and Abx stopped per ICU team.   On 10/24/17- restarted ABx due to secretions , and repeat sputum cx sent.   Family agreed on DNR, cont vent support for now. 2.  CAD: Stable; continue aspirin 3.  Atrial fibrillation: Rate controlled; continue diltiazem     Again RVR, started cardizem drip.     Lovenox therapeutic dose now. 4.  CHF: Systolic; chronic.  Continue Lasix per home regimen.  BNP is only mildly elevated.  No rales on physical exam. 5.  Diabetes mellitus type 2: keep on ISS. 6. Hyperlipidemia: Continue statin therapy 7.  Myasthenia gravis: Stable; continue pyridostigmine  8.  Depression: Continue sertraline 9.  DVT prophylaxis: Lovenox 10.  GI prophylaxis: None   All the records are reviewed and case discussed with Care Management/Social Workerr. Management plans discussed with the patient, family and they are in agreement.  CODE STATUS: Full.  TOTAL TIME TAKING CARE OF THIS  PATIENT: 35 minutes.   POSSIBLE D/C IN 2-3 DAYS, DEPENDING ON CLINICAL CONDITION.   Vaughan Basta M.D on 10/24/2017   Between 7am to 6pm - Pager - [phone]  After 6pm go to [email or web address] - password [email or web address] [facility]  Office  [phone]  CC: Primary care physician; Jerrol Banana., MD  Note: This dictation was prepared with Dragon dictation along with smaller phrase technology. Any transcriptional errors that result from this process are unintentional.

## 2017-10-24 NOTE — Progress Notes (Signed)
[facility] at [place] NAME: Julia Crawford    MR#:  [id number]  DATE OF BIRTH:  03-16-1935  SUBJECTIVE:  CHIEF COMPLAINT:   Chief Complaint  Patient presents with  . Respiratory Distress    Came with respi distress. Was on Bipap but worsening, so intubated.   Remains on vent support.  REVIEW OF SYSTEMS:  Intubated. ROS  DRUG ALLERGIES:   Allergies  Allergen Reactions  . Ace Inhibitors Cough    Other reaction(s): Unknown  . Apixaban     Other reaction(s): Unknown  . Azithromycin     Myasthenia Gravis Patient  . Butylene Glycol Other (See Comments)  . Codeine Itching and Swelling  . Doxycycline   . Lasix [Furosemide] Other (See Comments)    Chest and back pain  . Peanut Butter Flavor     rash?  . Telithromycin     Other reaction(s): Other (See Comments) Myasthenia Gravis Patient    VITALS:  Blood pressure 115/70, pulse 62, temperature 98.8 F (37.1 C), temperature source Oral, resp. rate 15, height 5' (1.524 m), weight 96.3 kg (212 lb 4.9 oz), SpO2 95 %.  PHYSICAL EXAMINATION:  GENERAL:  82 y.o.-year-old patient lying in the bed , critical appearance.  EYES: Pupils equal, round, reactive to light and accommodation. No scleral icterus. Extraocular muscles intact.  HEENT: Head atraumatic, normocephalic. Oropharynx and nasopharynx clear.  NECK:  Supple, no jugular venous distention. No thyroid enlargement, no tenderness.  LUNGS: Normal breath sounds bilaterally, wheezing, no crepitation. ETT , on vent support. CARDIOVASCULAR: S1, S2 normal. No murmurs, rubs, or gallops.  ABDOMEN: Soft, nontender, nondistended. Bowel sounds present. No organomegaly or mass.  EXTREMITIES: No pedal edema, cyanosis, or clubbing.  NEUROLOGIC: sedated on vent support.  PSYCHIATRIC: sedated on vent support.  SKIN: No obvious rash, lesion, or ulcer.   Physical Exam LABORATORY PANEL:   CBC Recent Labs  Lab 10/24/17 0356  WBC 6.0  HGB 11.2*   HCT 34.1*  PLT 163   ------------------------------------------------------------------------------------------------------------------  Chemistries  Recent Labs  Lab 10/22/17 0205 10/23/17 0426 10/24/17 0356  NA 138 138 140  K 4.5 4.5 4.4  CL 101 101 103  CO2 29 28 29   GLUCOSE 226* 231* 150*  BUN 25* 38* 25*  CREATININE 0.82 0.68 0.49  CALCIUM 7.8* 8.4* 8.3*  MG 2.1  --   --   AST  --  25  --   ALT  --  10*  --   ALKPHOS  --  32*  --   BILITOT  --  0.7  --    ------------------------------------------------------------------------------------------------------------------  Cardiac Enzymes Recent Labs  Lab 10/22/17 1428 10/23/17 0225  TROPONINI 0.05* 0.04*   ------------------------------------------------------------------------------------------------------------------  RADIOLOGY:  Dg Chest Port 1 View  Result Date: 10/24/2017 CLINICAL DATA:  Acute onset of respiratory failure. EXAM: PORTABLE CHEST 1 VIEW COMPARISON:  Chest radiograph performed 10/23/2017 FINDINGS: The patient's endotracheal tube is seen ending 2 cm above the carina. An enteric tube is noted extending below the diaphragm. A left IJ line is noted ending about the mid SVC. Mild left basilar opacity may reflect atelectasis or possibly mild infection. Peribronchial thickening is noted. No pleural effusion or pneumothorax is seen. The cardiomediastinal silhouette is mildly enlarged. No acute osseous abnormalities are identified. IMPRESSION: 1. Endotracheal tube seen ending 2 cm above the carina. 2. Mild left basilar airspace opacity may reflect atelectasis or possibly mild infection. Peribronchial thickening noted. 3. Mild cardiomegaly. Electronically Signed  By: Garald Balding M.D.   On: 10/24/2017 05:39   Dg Chest Port 1 View  Result Date: 10/23/2017 CLINICAL DATA:  Hypoxia EXAM: PORTABLE CHEST 1 VIEW COMPARISON:  October 22, 2017 FINDINGS: Endotracheal tube tip is 1.0 cm above the carina. Central  catheter tip is in superior vena cava. Nasogastric tube tip and side port below the diaphragm. No pneumothorax. There is no edema or consolidation. There is cardiomegaly with mild pulmonary venous hypertension. No adenopathy. No evident bone lesions. There is aortic atherosclerosis. IMPRESSION: Tube and catheter positions as described without pneumothorax. Note that the endotracheal tube tip is near the carina ; suggest withdrawing endotracheal tube approximately 2 cm. Pulmonary vascular congestion without frank edema or consolidation. There is aortic atherosclerosis. Aortic Atherosclerosis (ICD10-I70.0). Electronically Signed   By: Lowella Grip III M.D.   On: 10/23/2017 07:10    ASSESSMENT AND PLAN:   Active Problems:   Acute on chronic respiratory failure with hypercapnia (HCC)   Dyspnea  1.  Respiratory failure: Acute on chronic; with hypoxia and hypercapnia.     Status asthamaticus Was on BiPAP.    Worsening, so intubated by Dr. Mortimer Fries.   Cont steroids, nebs and Abx stopped per ICU team.   Family agreed on DNR, cont vent support for now. 2.  CAD: Stable; continue aspirin 3.  Atrial fibrillation: Rate controlled; continue diltiazem 4.  CHF: Systolic; chronic.  Continue Lasix per home regimen.  BNP is only mildly elevated.  No rales on physical exam. 5.  Diabetes mellitus type 2: keep on ISS. 6. Hyperlipidemia: Continue statin therapy 7.  Myasthenia gravis: Stable; continue pyridostigmine  8.  Depression: Continue sertraline 9.  DVT prophylaxis: Lovenox 10.  GI prophylaxis: None   All the records are reviewed and case discussed with Care Management/Social Workerr. Management plans discussed with the patient, family and they are in agreement.  CODE STATUS: Full.  TOTAL TIME TAKING CARE OF THIS PATIENT: 35 minutes.   POSSIBLE D/C IN 2-3 DAYS, DEPENDING ON CLINICAL CONDITION.   Vaughan Basta M.D on 10/24/2017   Between 7am to 6pm - Pager - [phone]  After 6pm go  to [email or web address] - password [email or web address] [facility]  Office  [phone]  CC: Primary care physician; Jerrol Banana., MD  Note: This dictation was prepared with Dragon dictation along with smaller phrase technology. Any transcriptional errors that result from this process are unintentional.

## 2017-10-24 NOTE — Progress Notes (Signed)
Pharmacy Antibiotic Note  AMA MCMASTER is a 82 y.o. female admitted on 10/20/2017 with pneumonia.  Pharmacy has been consulted for Ceftriaxone dosing.  Plan: Will initiate Ceftriaxone 1g Q24H. Pharmacy will continue to follow.   Height: 5' (152.4 cm) Weight: 212 lb 4.9 oz (96.3 kg) IBW/kg (Calculated) : 45.5  Temp (24hrs), Avg:98.4 F (36.9 C), Min:97.8 F (36.6 C), Max:98.9 F (37.2 C)  Recent Labs  Lab 10/20/17 1514 10/21/17 0627 10/22/17 0205 10/23/17 0426 10/24/17 0356  WBC 5.1  --  7.1 6.5 6.0  CREATININE 0.66 0.77 0.82 0.68 0.49    Estimated Creatinine Clearance: 58.3 mL/min (by C-G formula based on SCr of 0.49 mg/dL).    Allergies  Allergen Reactions  . Ace Inhibitors Cough    Other reaction(s): Unknown  . Apixaban     Other reaction(s): Unknown  . Azithromycin     Myasthenia Gravis Patient  . Butylene Glycol Other (See Comments)  . Codeine Itching and Swelling  . Doxycycline   . Lasix [Furosemide] Other (See Comments)    Chest and back pain  . Peanut Butter Flavor     rash?  . Telithromycin     Other reaction(s): Other (See Comments) Myasthenia Gravis Patient    Antimicrobials this admission: Ceftriaxone 11/15 >>    Dose adjustments this admission:   Microbiology results: BCx: NGTD Respiratory Cx: Growth associated with normal flora MRSA PCR: Negative   Thank you for allowing pharmacy to be a part of this patient's care.  Lendon Ka, PharmD Pharmacy Resident 10/24/2017 2:29 PM

## 2017-10-24 NOTE — Progress Notes (Signed)
PULMONARY / CRITICAL CARE MEDICINE   Name: Julia Crawford MRN: [id number] DOB: 1935-10-29    ADMISSION DATE:  10/20/2017  PT PROFILE:   33 F never smoker with history of DM II, hypothyroidism, myasthenia and asthma admitted via ED with 2-3 days of increasing SOB. Initially required BiPAP in ED. Required intubation 11/12 for persistent bronchospasm, hypercarbia despite BiPAP  MAJOR EVENTS/TEST RESULTS: 11/11 Admitted with dx of acute/chronic hypercarbic respiratory failure due to asthma exacerbation 11/12 intubated 11/13 very tight bronchospasm with very prolonged expiratory time and very high PIPs with pt-vent dyssynchrony requiring NMB.  11/13 developed AF with very slow vent rate. Made DNR in event of cardiac arrest 11/14 Severe but improved bronchospasm. Able to F/C.  11/15 persistent severe bronchospasm.  Tolerated PSV mode only briefly.  Cognition intact on WUA.  Increased airway secretions  INDWELLING DEVICES:: ETT 11/12 >>  L IJ CVL 11/12 >>   MICRO DATA: MRSA PCR 11/11 >> NEG Resp 11/12 >> NOF Blood 11/11 >> NEG Resp 11/15 >>   ANTIMICROBIALS:  Ceftriaxone 11/15 >>     SUBJECTIVE:  Sedated but able to F/C, intubated. Synchronous.   VITAL SIGNS: BP (!) 109/59   Pulse 62   Temp 97.8 F (36.6 C)   Resp 15   Ht 5' (1.524 m)   Wt 96.3 kg (212 lb 4.9 oz)   SpO2 96%   BMI 41.46 kg/m   HEMODYNAMICS:    VENTILATOR SETTINGS: Vent Mode: PRVC FiO2 (%):  [30 %-40 %] 30 % Set Rate:  [15 bmp] 15 bmp Vt Set:  [500 mL] 500 mL PEEP:  [5 cmH20] 5 cmH20 Pressure Support:  [24 cmH20] 24 cmH20  INTAKE / OUTPUT: I/O last 3 completed shifts: In: 2559.7 [I.V.:792.2; NG/GT:1717.5; IV Piggyback:50] Out: 2195 [FYBOF:7510; Emesis/NG output:600]  PHYSICAL EXAMINATION: General: Intubated, sedated, RASS -1,-2.  + F/C, synchronous Neuro: CNs intact, MAEs HEENT: NCAT, sclerae white Cardiovascular: reg, no M Lungs: prolonged expiratory wheezes, diffuse rhonchi, moderate  mucopurulent secretions Abdomen: obese, soft, diminished BS Ext: warm, no edema Skin: no lesions noted  LABS:  BMET Recent Labs  Lab 10/22/17 0205 10/23/17 0426 10/24/17 0356  NA 138 138 140  K 4.5 4.5 4.4  CL 101 101 103  CO2 29 28 29   BUN 25* 38* 25*  CREATININE 0.82 0.68 0.49  GLUCOSE 226* 231* 150*    Electrolytes Recent Labs  Lab 10/20/17 2352 10/21/17 0627 10/21/17 1340 10/22/17 0205 10/23/17 0426 10/24/17 0356  CALCIUM  --  8.3*  --  7.8* 8.4* 8.3*  MG 1.5* 1.5* 2.0 2.1  --   --   PHOS 4.6 4.7* 3.7  --   --   --     CBC Recent Labs  Lab 10/22/17 0205 10/23/17 0426 10/24/17 0356  WBC 7.1 6.5 6.0  HGB 11.3* 11.4* 11.2*  HCT 34.6* 35.3  33.7* 34.1*  PLT 161 162 163    Coag's Recent Labs  Lab 10/20/17 1514  INR 0.92    Sepsis Markers No results for input(s): LATICACIDVEN, PROCALCITON, O2SATVEN in the last 168 hours.  ABG Recent Labs  Lab 10/21/17 0021 10/21/17 1157 10/22/17 2000  PHART 7.25* 7.28* 7.49*  PCO2ART 76* 77* 40  PO2ART 122* 495* 154*    Liver Enzymes Recent Labs  Lab 10/20/17 1514 10/23/17 0426  AST 23 25  ALT 9* 10*  ALKPHOS 56 32*  BILITOT 1.0 0.7  ALBUMIN 4.5 3.2*    Cardiac Enzymes Recent Labs  Lab 10/21/17 2585 10/22/17  1428 10/23/17 0225  TROPONINI 0.05* 0.05* 0.04*    Glucose Recent Labs  Lab 10/23/17 1137 10/23/17 1606 10/23/17 1952 10/23/17 2308 10/24/17 0839 10/24/17 1303  GLUCAP 167* 232* 207* 166* 132* 144*    CXR: No edema or infiltrates   ASSESSMENT / PLAN:  PULMONARY A: Acute/chronic respiratory failure Status asthmaticus P:   Cont vent support - settings reviewed and/or adjusted Cont vent bundle Daily SBT if/when meets criteria  Cont systemic steroids Continue nebulized bronchodilators  Cont nebulized steroids   CARDIOVASCULAR A:  New onset AF (11/13) - RVR 11/15 P:  Monitor per ICU protocol Change to full dose anticoagulation Low-dose diltiazem infusion  initiated 11/15  RENAL A:   No issues P:   Monitor BMET intermittently Monitor I/Os Correct electrolytes as indicated   GASTROINTESTINAL A:   Obesity P:   SUP: IV famotidine Cont TFs protocol  HEMATOLOGIC A:   Macrocytic anemia - normal serum vitamin B12, RBC folate P:  DVT px: Full dose enoxaparin (for AF) Monitor CBC intermittently Transfuse per usual guidelines   INFECTIOUS A:   Purulent tracheobronchitis P:   Monitor temp, WBC count Micro and abx as above   ENDOCRINE A:   Type II DM - controlled P:   Continue SSI -changed to moderate scale 11/13 Continue Lantus-initiated 11/14  NEUROLOGIC A:   ICU/vent associated discomfort Patient-ventilator dyssynchrony -improved  Myasthenia gravis P:   RASS goal: N-1, -2 Continue PAD protocol - propofol, PRN fentanyl Continue pyridostigmine  FAMILY: Daughter and other family members updated at bedside   CCM time: 45 mins The above time includes time spent in consultation with patient and/or family members and reviewing care plan on multidisciplinary rounds  Merton Border, MD PCCM service Mobile [phone] Pager [phone] 10/24/2017, 4:05 PM

## 2017-10-24 NOTE — Progress Notes (Signed)
[facility] for Lovenox Indication: atrial fibrillation  Allergies  Allergen Reactions  . Ace Inhibitors Cough    Other reaction(s): Unknown  . Apixaban     Other reaction(s): Unknown  . Azithromycin     Myasthenia Gravis Patient  . Butylene Glycol Other (See Comments)  . Codeine Itching and Swelling  . Doxycycline   . Lasix [Furosemide] Other (See Comments)    Chest and back pain  . Peanut Butter Flavor     rash?  . Telithromycin     Other reaction(s): Other (See Comments) Myasthenia Gravis Patient    Patient Measurements: Height: 5' (152.4 cm) Weight: 212 lb 4.9 oz (96.3 kg) IBW/kg (Calculated) : 45.5  Vital Signs: Temp: 97.8 F (36.6 C) (11/15 1200) Temp Source: Oral (11/15 0400) BP: 136/94 (11/15 1300) Pulse Rate: 78 (11/15 1300)  Labs: Recent Labs    10/22/17 0205 10/22/17 1428 10/23/17 0225 10/23/17 0426 10/24/17 0356  HGB 11.3*  --   --  11.4* 11.2*  HCT 34.6*  --   --  35.3  33.7* 34.1*  PLT 161  --   --  162 163  CREATININE 0.82  --   --  0.68 0.49  TROPONINI  --  0.05* 0.04*  --   --     Estimated Creatinine Clearance: 58.3 mL/min (by C-G formula based on SCr of 0.49 mg/dL).   Medical History: Past Medical History:  Diagnosis Date  . Arthritis   . Asthma   . Atrial fibrillation ([place]) 04/14/2015  . COPD (chronic obstructive pulmonary disease) ([place])   . Coronary artery disease   . Diabetes mellitus without complication ([place])   . Emphysema of lung ([place])   . GERD (gastroesophageal reflux disease)   . Hypothyroid   . Irregular heart beat   . Myasthenia gravis [facility])     Assessment: 82 y/o F ventilated for asthma exacerbation with new-onset atrial fibrillation.   CHA2DS2/VAS Stroke Risk Points      6 >= 2 Points: High Risk  1 - 1.99 Points: Medium Risk  0 Points: Low Risk    This is the only CHA2DS2/VAS Stroke Risk Points available for the past  year.:  Change: N/A     Details    This score  determines the patient's risk of having a stroke if the  patient has atrial fibrillation.       Points Metrics  1 Has Congestive Heart Failure:  Yes   0 Has Vascular Disease:  No   1 Has Hypertension:  Yes   2 Age:  72   1 Has Diabetes:  Yes   0 Had Stroke:  No  Had TIA:  No  Had thromboembolism:  No   1 Female:  Yes      Goal of Theray:  Monitor platelets by anticoagulation protocol: Yes   Plan:  Lovenox 1 mg/kg (90 mg) q 12 hours.   Lendon Ka, PharmD Pharmacy Resident 10/24/2017,3:24 PM

## 2017-10-25 ENCOUNTER — Inpatient Hospital Stay: Payer: Medicare Other

## 2017-10-25 DIAGNOSIS — J9621 Acute and chronic respiratory failure with hypoxia: Secondary | ICD-10-CM

## 2017-10-25 LAB — CULTURE, BLOOD (ROUTINE X 2)
CULTURE: NO GROWTH
Culture: NO GROWTH
SPECIAL REQUESTS: ADEQUATE

## 2017-10-25 LAB — CBC
HEMATOCRIT: 34.7 % — AB (ref 35.0–47.0)
HEMOGLOBIN: 11.6 g/dL — AB (ref 12.0–16.0)
MCH: 34.9 pg — AB (ref 26.0–34.0)
MCHC: 33.4 g/dL (ref 32.0–36.0)
MCV: 104.5 fL — AB (ref 80.0–100.0)
Platelets: 175 10*3/uL (ref 150–440)
RBC: 3.33 MIL/uL — AB (ref 3.80–5.20)
RDW: 17.1 % — ABNORMAL HIGH (ref 11.5–14.5)
WBC: 5.5 10*3/uL (ref 3.6–11.0)

## 2017-10-25 LAB — GLUCOSE, CAPILLARY
Glucose-Capillary: 207 mg/dL — ABNORMAL HIGH (ref 65–99)
Glucose-Capillary: 222 mg/dL — ABNORMAL HIGH (ref 65–99)
Glucose-Capillary: 223 mg/dL — ABNORMAL HIGH (ref 65–99)
Glucose-Capillary: 193 mg/dL — ABNORMAL HIGH (ref 65–99)
Glucose-Capillary: 192 mg/dL — ABNORMAL HIGH (ref 65–99)

## 2017-10-25 LAB — COMPREHENSIVE METABOLIC PANEL
ALK PHOS: 34 U/L — AB (ref 38–126)
ALT: 14 U/L (ref 14–54)
AST: 21 U/L (ref 15–41)
Albumin: 2.7 g/dL — ABNORMAL LOW (ref 3.5–5.0)
Anion gap: 7 (ref 5–15)
BILIRUBIN TOTAL: 0.6 mg/dL (ref 0.3–1.2)
BUN: 32 mg/dL — AB (ref 6–20)
CALCIUM: 8.1 mg/dL — AB (ref 8.9–10.3)
CHLORIDE: 104 mmol/L (ref 101–111)
CO2: 30 mmol/L (ref 22–32)
CREATININE: 0.55 mg/dL (ref 0.44–1.00)
Glucose, Bld: 230 mg/dL — ABNORMAL HIGH (ref 65–99)
Potassium: 4 mmol/L (ref 3.5–5.1)
Sodium: 141 mmol/L (ref 135–145)
Total Protein: 6 g/dL — ABNORMAL LOW (ref 6.5–8.1)

## 2017-10-25 LAB — TRIGLYCERIDES: TRIGLYCERIDES: 103 mg/dL (ref <150)

## 2017-10-25 MED ORDER — POLYETHYLENE GLYCOL 3350 17 G PO PACK
17.0000 g | PACK | Freq: Once | ORAL | Status: AC
  Administered 2017-10-25: 17 g via ORAL
  Filled 2017-10-25: qty 1

## 2017-10-25 MED ORDER — DEXTROSE 5 % IV SOLN
1.0000 g | INTRAVENOUS | Status: AC
  Administered 2017-10-25 – 2017-10-30 (×6): 1 g via INTRAVENOUS
  Filled 2017-10-25 (×6): qty 10

## 2017-10-25 MED ORDER — SENNOSIDES-DOCUSATE SODIUM 8.6-50 MG PO TABS
2.0000 | ORAL_TABLET | Freq: Two times a day (BID) | ORAL | Status: DC
  Administered 2017-10-25 – 2017-10-28 (×6): 2
  Filled 2017-10-25 (×6): qty 2

## 2017-10-25 MED ORDER — ENOXAPARIN SODIUM 100 MG/ML ~~LOC~~ SOLN
95.0000 mg | Freq: Two times a day (BID) | SUBCUTANEOUS | Status: DC
  Administered 2017-10-25 – 2017-10-27 (×4): 95 mg via SUBCUTANEOUS
  Filled 2017-10-25 (×6): qty 1

## 2017-10-25 MED ORDER — INSULIN GLARGINE 100 UNIT/ML ~~LOC~~ SOLN
20.0000 [IU] | Freq: Every day | SUBCUTANEOUS | Status: DC
  Administered 2017-10-26 – 2017-10-31 (×6): 20 [IU] via SUBCUTANEOUS
  Filled 2017-10-25 (×7): qty 0.2

## 2017-10-25 NOTE — Progress Notes (Signed)
Pt has remained in NSR with a BBB, HR/BP WNL. Pt failed SBT (RR decrease-not on true PS) this am d/t WOB, tachycardia 130s, SpO2 decreased to 84%. Increase in airway secretions-tan, thick. Pt will FC and nod appropriately. Lots of family updated and visited at bedside today. Pt remains comfortable on 20 of propofol.

## 2017-10-25 NOTE — Progress Notes (Signed)
Julia Crawford at [place] NAME: Julia Crawford    MR#:  [id number]  DATE OF BIRTH:  09/03/1935  SUBJECTIVE:  CHIEF COMPLAINT:   Chief Complaint  Patient presents with  . Respiratory Distress    Came with respi distress. Was on Bipap but worsening, so intubated.   Remains on vent support. More secretions, had A fib with RVR. Now HR stable.  REVIEW OF SYSTEMS:  Intubated. ROS  DRUG ALLERGIES:   Allergies  Allergen Reactions  . Ace Inhibitors Cough    Other reaction(s): Unknown  . Apixaban     Other reaction(s): Unknown  . Azithromycin     Myasthenia Gravis Patient  . Butylene Glycol Other (See Comments)  . Codeine Itching and Swelling  . Doxycycline   . Lasix [Furosemide] Other (See Comments)    Chest and back pain  . Peanut Butter Flavor     rash?  . Telithromycin     Other reaction(s): Other (See Comments) Myasthenia Gravis Patient    VITALS:  Blood pressure 139/82, pulse 64, temperature 99 F (37.2 C), temperature source Axillary, resp. rate 15, height 5' (1.524 m), weight 95.2 kg (209 lb 14.1 oz), SpO2 90 %.  PHYSICAL EXAMINATION:  GENERAL:  82 y.o.-year-old patient lying in the bed , critical appearance.  EYES: Pupils equal, round, reactive to light and accommodation. No scleral icterus. Extraocular muscles intact.  HEENT: Head atraumatic, normocephalic. Oropharynx and nasopharynx clear.  NECK:  Supple, no jugular venous distention. No thyroid enlargement, no tenderness.  LUNGS: Normal breath sounds bilaterally, wheezing, no crepitation. ETT , on vent support. CARDIOVASCULAR: S1, S2 normal. No murmurs, rubs, or gallops.  ABDOMEN: Soft, nontender, nondistended. Bowel sounds present. No organomegaly or mass.  EXTREMITIES: No pedal edema, cyanosis, or clubbing.  NEUROLOGIC: sedated on vent support.  PSYCHIATRIC: sedated on vent support.  SKIN: No obvious rash, lesion, or ulcer.   Physical Exam LABORATORY PANEL:    CBC Recent Labs  Lab 10/25/17 0357  WBC 5.5  HGB 11.6*  HCT 34.7*  PLT 175   ------------------------------------------------------------------------------------------------------------------  Chemistries  Recent Labs  Lab 10/22/17 0205  10/25/17 0357  NA 138   < > 141  K 4.5   < > 4.0  CL 101   < > 104  CO2 29   < > 30  GLUCOSE 226*   < > 230*  BUN 25*   < > 32*  CREATININE 0.82   < > 0.55  CALCIUM 7.8*   < > 8.1*  MG 2.1  --   --   AST  --    < > 21  ALT  --    < > 14  ALKPHOS  --    < > 34*  BILITOT  --    < > 0.6   < > = values in this interval not displayed.   ------------------------------------------------------------------------------------------------------------------  Cardiac Enzymes Recent Labs  Lab 10/22/17 1428 10/23/17 0225  TROPONINI 0.05* 0.04*   ------------------------------------------------------------------------------------------------------------------  RADIOLOGY:  Dg Chest Port 1 View  Result Date: 10/25/2017 CLINICAL DATA:  Hypoxia EXAM: PORTABLE CHEST 1 VIEW COMPARISON:  October 24, 2017 FINDINGS: Endotracheal tube tip is 2.4 cm above the carina. Nasogastric tube tip and side port are below the diaphragm. Central catheter tip is at the cavoatrial junction. No pneumothorax. There is mild interstitial edema. There is no airspace consolidation. There is a small left pleural effusion. There is cardiomegaly with mild pulmonary venous hypertension. There is aortic  atherosclerosis. No adenopathy. No bone lesions. IMPRESSION: Tube and catheter positions as described without pneumothorax. Mild interstitial edema with small left pleural effusion. There is pulmonary vascular congestion. There is aortic atherosclerosis. Aortic Atherosclerosis (ICD10-I70.0). Electronically Signed   By: Lowella Grip III M.D.   On: 10/25/2017 07:36   Dg Chest Port 1 View  Result Date: 10/24/2017 CLINICAL DATA:  Acute onset of respiratory failure. EXAM: PORTABLE  CHEST 1 VIEW COMPARISON:  Chest radiograph performed 10/23/2017 FINDINGS: The patient's endotracheal tube is seen ending 2 cm above the carina. An enteric tube is noted extending below the diaphragm. A left IJ line is noted ending about the mid SVC. Mild left basilar opacity may reflect atelectasis or possibly mild infection. Peribronchial thickening is noted. No pleural effusion or pneumothorax is seen. The cardiomediastinal silhouette is mildly enlarged. No acute osseous abnormalities are identified. IMPRESSION: 1. Endotracheal tube seen ending 2 cm above the carina. 2. Mild left basilar airspace opacity may reflect atelectasis or possibly mild infection. Peribronchial thickening noted. 3. Mild cardiomegaly. Electronically Signed   By: Garald Balding M.D.   On: 10/24/2017 05:39    ASSESSMENT AND PLAN:   Active Problems:   Acute on chronic respiratory failure with hypercapnia (HCC)   Dyspnea  1.  Respiratory failure: Acute on chronic; with hypoxia and hypercapnia.     Status asthamaticus, tracheobronchitis Was on BiPAP.    Worsening, so intubated by Dr. Mortimer Fries.   Cont steroids, nebs and Abx stopped per ICU team.   On 10/24/17- restarted ABx due to secretions , and repeat sputum cx sent.   Family agreed on DNR, cont vent support for now. 2.  CAD: Stable; continue aspirin 3.  Atrial fibrillation: Rate controlled; continue diltiazem     Again RVR, started cardizem drip.     Lovenox therapeutic dose now. 4.  CHF: Systolic; chronic.  Continue Lasix per home regimen.  BNP is only mildly elevated.  No rales on physical exam. 5.  Diabetes mellitus type 2: keep on ISS. 6. Hyperlipidemia: Continue statin therapy 7.  Myasthenia gravis: Stable; continue pyridostigmine  8.  Depression: Continue sertraline 9.  DVT prophylaxis: Lovenox 10.  GI prophylaxis: None   All the records are reviewed and case discussed with Care Management/Social Workerr. Management plans discussed with the patient, family and  they are in agreement.  CODE STATUS: Full.  TOTAL TIME TAKING CARE OF THIS PATIENT: 35 minutes.   POSSIBLE D/C IN 2-3 DAYS, DEPENDING ON CLINICAL CONDITION.   Vaughan Basta M.D on 10/25/2017   Between 7am to 6pm - Pager - [phone]  After 6pm go to [email or web address] - password [email or web address] [facility]  Office  [phone]  CC: Primary care physician; Jerrol Banana., MD  Note: This dictation was prepared with Dragon dictation along with smaller phrase technology. Any transcriptional errors that result from this process are unintentional.

## 2017-10-25 NOTE — Progress Notes (Signed)
PULMONARY / CRITICAL CARE MEDICINE   Name: Julia Crawford MRN: [id number] DOB: May 24, 1935    ADMISSION DATE:  10/20/2017  PT PROFILE:   40 F never smoker with history of DM II, hypothyroidism, myasthenia and asthma admitted via ED with 2-3 days of increasing SOB. Initially required BiPAP in ED. Required intubation 11/12 for persistent bronchospasm, hypercarbia despite BiPAP  MAJOR EVENTS/TEST RESULTS: 11/11 Admitted with dx of acute/chronic hypercarbic respiratory failure due to asthma exacerbation 11/12 intubated 11/13 very tight bronchospasm with very prolonged expiratory time and very high PIPs with pt-vent dyssynchrony requiring NMB.  11/13 developed AF with very slow vent rate. Made DNR in event of cardiac arrest 11/14 Severe but improved bronchospasm. Able to F/C.  11/15 persistent severe bronchospasm.  Tolerated PSV mode only briefly.  Cognition intact on WUA.  Increased airway secretions  INDWELLING DEVICES:: ETT 11/12 >>  L IJ CVL 11/12 >>   MICRO DATA: MRSA PCR 11/11 >> NEG Resp 11/12 >> NOF Blood 11/11 >> NEG Resp 11/15 >>   ANTIMICROBIALS:  Ceftriaxone 11/15 >>     SUBJECTIVE:  Sedated but able to F/C, intubated. Synchronous.   VITAL SIGNS: BP 119/63   Pulse 62   Temp 98 F (36.7 C) (Axillary)   Resp 15   Ht 5' (1.524 m)   Wt 95.2 kg (209 lb 14.1 oz)   SpO2 94%   BMI 40.99 kg/m   HEMODYNAMICS:    VENTILATOR SETTINGS: Vent Mode: PRVC FiO2 (%):  [30 %] 30 % Set Rate:  [8 bmp-15 bmp] 15 bmp Vt Set:  [500 mL] 500 mL PEEP:  [5 cmH20] 5 cmH20 Plateau Pressure:  [22 cmH20-25 cmH20] 25 cmH20  INTAKE / OUTPUT: I/O last 3 completed shifts: In: 2392 [I.V.:432; Other:120; NG/GT:1790; IV Piggyback:50] Out: 2690 [Urine:1890; Emesis/NG output:800]  PHYSICAL EXAMINATION: General: Intubated, sedated, RASS -1,-2.  + F/C, synchronous Neuro: CNs intact, MAEs HEENT: NCAT, sclerae white Cardiovascular: reg, no M Lungs: prolonged expiratory wheezes, diffuse  rhonchi, moderate mucopurulent secretions Abdomen: obese, soft, diminished BS Ext: warm, no edema Skin: no lesions noted  LABS:  BMET Recent Labs  Lab 10/23/17 0426 10/24/17 0356 10/25/17 0357  NA 138 140 141  K 4.5 4.4 4.0  CL 101 103 104  CO2 28 29 30   BUN 38* 25* 32*  CREATININE 0.68 0.49 0.55  GLUCOSE 231* 150* 230*    Electrolytes Recent Labs  Lab 10/20/17 2352 10/21/17 0627 10/21/17 1340 10/22/17 0205 10/23/17 0426 10/24/17 0356 10/25/17 0357  CALCIUM  --  8.3*  --  7.8* 8.4* 8.3* 8.1*  MG 1.5* 1.5* 2.0 2.1  --   --   --   PHOS 4.6 4.7* 3.7  --   --   --   --     CBC Recent Labs  Lab 10/23/17 0426 10/24/17 0356 10/25/17 0357  WBC 6.5 6.0 5.5  HGB 11.4* 11.2* 11.6*  HCT 35.3  33.7* 34.1* 34.7*  PLT 162 163 175    Coag's Recent Labs  Lab 10/20/17 1514  INR 0.92    Sepsis Markers No results for input(s): LATICACIDVEN, PROCALCITON, O2SATVEN in the last 168 hours.  ABG Recent Labs  Lab 10/21/17 0021 10/21/17 1157 10/22/17 2000  PHART 7.25* 7.28* 7.49*  PCO2ART 76* 77* 40  PO2ART 122* 495* 154*    Liver Enzymes Recent Labs  Lab 10/20/17 1514 10/23/17 0426 10/25/17 0357  AST 23 25 21   ALT 9* 10* 14  ALKPHOS 56 32* 34*  BILITOT 1.0 0.7  0.6  ALBUMIN 4.5 3.2* 2.7*    Cardiac Enzymes Recent Labs  Lab 10/21/17 0627 10/22/17 1428 10/23/17 0225  TROPONINI 0.05* 0.05* 0.04*    Glucose Recent Labs  Lab 10/24/17 1303 10/24/17 1605 10/24/17 1917 10/24/17 2323 10/25/17 0317 10/25/17 0747  GLUCAP 144* 213* 258* 218* 207* 222*    CXR: No edema or infiltrates   ASSESSMENT / PLAN:  PULMONARY A: Acute/chronic respiratory failure. Attempted weaning trial this morning, was unsuccessful, patient with increasing respiratory rate, desaturation, significant cough with copious secretions  Status asthmaticus P:   Cont vent support - settings reviewed and/or adjusted Cont vent bundle Daily SBT if/when meets criteria  Cont  systemic steroids Continue nebulized bronchodilators  Cont nebulized steroids   CARDIOVASCULAR A:  New onset AF (11/13) - RVR 11/15 P:  Monitor per ICU protocol Change to full dose anticoagulation Low-dose diltiazem infusion initiated 11/15  RENAL A:   No issues P:   Monitor BMET intermittently Monitor I/Os Correct electrolytes as indicated   GASTROINTESTINAL A:   Obesity P:   SUP: IV famotidine Cont TFs protocol  HEMATOLOGIC A:   Macrocytic anemia - normal serum vitamin B12, RBC folate P:  DVT px: Full dose enoxaparin (for AF) Monitor CBC intermittently Transfuse per usual guidelines   INFECTIOUS A:   Purulent tracheobronchitis P:   Monitor temp, WBC count Micro and abx as above   ENDOCRINE A:   Type II DM - controlled P:   Continue SSI -changed to moderate scale 11/13 Continue Lantus-initiated 11/14  NEUROLOGIC A:   ICU/vent associated discomfort Patient-ventilator dyssynchrony -improved  Myasthenia gravis P:   RASS goal: N-1, -2 Continue PAD protocol - propofol, PRN fentanyl Continue pyridostigmine  FAMILY: Daughter and other family members updated at bedside   CCM time: 40 mins  Danija Gosa, DO

## 2017-10-25 NOTE — Progress Notes (Signed)
Julia Crawford for Lovenox Indication: atrial fibrillation  Allergies  Allergen Reactions  . Ace Inhibitors Cough    Other reaction(s): Unknown  . Apixaban     Other reaction(s): Unknown  . Azithromycin     Myasthenia Gravis Patient  . Butylene Glycol Other (See Comments)  . Codeine Itching and Swelling  . Doxycycline   . Lasix [Furosemide] Other (See Comments)    Chest and back pain  . Peanut Butter Flavor     rash?  . Telithromycin     Other reaction(s): Other (See Comments) Myasthenia Gravis Patient    Patient Measurements: Height: 5' (152.4 cm) Weight: 209 lb 14.1 oz (95.2 kg) IBW/kg (Calculated) : 45.5  Vital Signs: Temp: 99 F (37.2 C) (11/16 1200) Temp Source: Axillary (11/16 1200) BP: 136/78 (11/16 1500) Pulse Rate: 82 (11/16 1500)  Labs: Recent Labs    10/23/17 0225  10/23/17 0426 10/24/17 0356 10/25/17 0357  HGB  --    < > 11.4* 11.2* 11.6*  HCT  --   --  35.3  33.7* 34.1* 34.7*  PLT  --   --  162 163 175  CREATININE  --   --  0.68 0.49 0.55  TROPONINI 0.04*  --   --   --   --    < > = values in this interval not displayed.    Estimated Creatinine Clearance: 57.9 mL/min (by C-G formula based on SCr of 0.55 mg/dL).   Medical History: Past Medical History:  Diagnosis Date  . Arthritis   . Asthma   . Atrial fibrillation ([place]) 04/14/2015  . COPD (chronic obstructive pulmonary disease) ([place])   . Coronary artery disease   . Diabetes mellitus without complication ([place])   . Emphysema of lung ([place])   . GERD (gastroesophageal reflux disease)   . Hypothyroid   . Irregular heart beat   . Myasthenia gravis [facility])     Assessment: 82 y/o F ventilated for asthma exacerbation with new-onset atrial fibrillation.   CHA2DS2/VAS Stroke Risk Points      6 >= 2 Points: High Risk  1 - 1.99 Points: Medium Risk  0 Points: Low Risk    This is the only CHA2DS2/VAS Stroke Risk Points available for the past  year.:  Change: N/A      Details    This score determines the patient's risk of having a stroke if the  patient has atrial fibrillation.       Points Metrics  1 Has Congestive Heart Failure:  Yes   0 Has Vascular Disease:  No   1 Has Hypertension:  Yes   2 Age:  5   1 Has Diabetes:  Yes   0 Had Stroke:  No  Had TIA:  No  Had thromboembolism:  No   1 Female:  Yes      Goal of Theray:  Monitor platelets by anticoagulation protocol: Yes   Plan:  Lovenox increased to 95mg  Q12H (1mg /kg, BW = 95kg). Pharmacy will continue to follow and adjust as needed.   Lendon Ka, PharmD Pharmacy Resident 10/25/2017,4:41 PM

## 2017-10-25 NOTE — Progress Notes (Signed)
Pharmacy Antibiotic Note  Julia Crawford is a 82 y.o. female admitted on 10/20/2017 with pneumonia.  Pharmacy has been consulted for Ceftriaxone dosing.  Plan: Will initiate Ceftriaxone 1g Q24H. Pharmacy will continue to follow.   Height: 5' (152.4 cm) Weight: 209 lb 14.1 oz (95.2 kg) IBW/kg (Calculated) : 45.5  Temp (24hrs), Avg:98.5 F (36.9 C), Min:98 F (36.7 C), Max:99 F (37.2 C)  Recent Labs  Lab 10/20/17 1514 10/21/17 0627 10/22/17 0205 10/23/17 0426 10/24/17 0356 10/25/17 0357  WBC 5.1  --  7.1 6.5 6.0 5.5  CREATININE 0.66 0.77 0.82 0.68 0.49 0.55    Estimated Creatinine Clearance: 57.9 mL/min (by C-G formula based on SCr of 0.55 mg/dL).    Allergies  Allergen Reactions  . Ace Inhibitors Cough    Other reaction(s): Unknown  . Apixaban     Other reaction(s): Unknown  . Azithromycin     Myasthenia Gravis Patient  . Butylene Glycol Other (See Comments)  . Codeine Itching and Swelling  . Doxycycline   . Lasix [Furosemide] Other (See Comments)    Chest and back pain  . Peanut Butter Flavor     rash?  . Telithromycin     Other reaction(s): Other (See Comments) Myasthenia Gravis Patient    Antimicrobials this admission: Ceftriaxone 11/15 >>    Dose adjustments this admission:   Microbiology results: BCx: NGTD Respiratory Cx: Growth associated with normal flora MRSA PCR: Negative   Thank you for allowing pharmacy to be a part of this patient's care.  Lendon Ka, PharmD Pharmacy Resident 10/25/2017 4:38 PM

## 2017-10-25 NOTE — Progress Notes (Signed)
Pt remains on PRVC mode on vent, settings include: 15, 500, 30%, 5 peep.  Pt sedated on Propofol at 20 mcg, RASS of -1, pt will arouse to voice and follow commands.  A-fib on telemetry, HR ranges from 55-65, in which Cardizem gtt has been off for entirety of shift.  No runs of V-tach this shift.  Pt has required prn Fentanyl pushes for intermittent discomfort.  Tolerating tube feedings, at goal rate of 30 ml/hr.  Adequate urine output from foley.  Vital signs stable, afebrile.

## 2017-10-26 LAB — GLUCOSE, CAPILLARY
Glucose-Capillary: 170 mg/dL — ABNORMAL HIGH (ref 65–99)
Glucose-Capillary: 202 mg/dL — ABNORMAL HIGH (ref 65–99)
Glucose-Capillary: 205 mg/dL — ABNORMAL HIGH (ref 65–99)
Glucose-Capillary: 225 mg/dL — ABNORMAL HIGH (ref 65–99)
Glucose-Capillary: 228 mg/dL — ABNORMAL HIGH (ref 65–99)
Glucose-Capillary: 237 mg/dL — ABNORMAL HIGH (ref 65–99)
Glucose-Capillary: 242 mg/dL — ABNORMAL HIGH (ref 65–99)

## 2017-10-26 LAB — BASIC METABOLIC PANEL
ANION GAP: 9 (ref 5–15)
BUN: 30 mg/dL — ABNORMAL HIGH (ref 6–20)
CO2: 30 mmol/L (ref 22–32)
Calcium: 8.4 mg/dL — ABNORMAL LOW (ref 8.9–10.3)
Chloride: 100 mmol/L — ABNORMAL LOW (ref 101–111)
Creatinine, Ser: 0.61 mg/dL (ref 0.44–1.00)
Glucose, Bld: 231 mg/dL — ABNORMAL HIGH (ref 65–99)
POTASSIUM: 4.4 mmol/L (ref 3.5–5.1)
SODIUM: 139 mmol/L (ref 135–145)

## 2017-10-26 LAB — MAGNESIUM: MAGNESIUM: 2.2 mg/dL (ref 1.7–2.4)

## 2017-10-26 LAB — PHOSPHORUS: Phosphorus: 3.1 mg/dL (ref 2.5–4.6)

## 2017-10-26 MED ORDER — BUDESONIDE 0.25 MG/2ML IN SUSP
0.2500 mg | Freq: Two times a day (BID) | RESPIRATORY_TRACT | Status: DC
  Administered 2017-10-26 – 2017-10-28 (×4): 0.25 mg via RESPIRATORY_TRACT
  Filled 2017-10-26 (×4): qty 2

## 2017-10-26 NOTE — Progress Notes (Signed)
PULMONARY / CRITICAL CARE MEDICINE   Name: Julia Crawford MRN: [id number] DOB: [birth year]    ADMISSION DATE:  10/20/2017  PT PROFILE:   89 F never smoker with history of DM II, hypothyroidism, myasthenia and asthma admitted via ED with 2-3 days of increasing SOB. Initially required BiPAP in ED. Required intubation 11/12 for persistent bronchospasm, hypercarbia despite BiPAP  MAJOR EVENTS/TEST RESULTS: 11/11 Admitted with dx of acute/chronic hypercarbic respiratory failure due to asthma exacerbation 11/12 intubated 11/13 very tight bronchospasm with very prolonged expiratory time and very high PIPs with pt-vent dyssynchrony requiring NMB.  11/13 developed AF with very slow vent rate. Made DNR in event of cardiac arrest 11/14 Severe but improved bronchospasm. Able to F/C.  11/15 persistent severe bronchospasm.  Tolerated PSV mode only briefly.  Cognition intact on WUA.  Increased airway secretions  INDWELLING DEVICES:: ETT 11/12 >>  L IJ CVL 11/12 >>   MICRO DATA: MRSA PCR 11/11 >> NEG Resp 11/12 >> NOF Blood 11/11 >> NEG Resp 11/15 >>   ANTIMICROBIALS:  Ceftriaxone 11/15 >>     SUBJECTIVE:  Sedated but able to F/C, intubated. Synchronous.   VITAL SIGNS: BP 121/67   Pulse 71   Temp 98.8 F (37.1 C) (Oral)   Resp 15   Ht 5' (1.524 m)   Wt 93.4 kg (205 lb 14.6 oz)   SpO2 98%   BMI 40.21 kg/m   HEMODYNAMICS:    VENTILATOR SETTINGS: Vent Mode: PRVC FiO2 (%):  [30 %] 30 % Set Rate:  [15 bmp] 15 bmp Vt Set:  [500 mL] 500 mL PEEP:  [5 cmH20] 5 cmH20 Plateau Pressure:  [21 cmH20-27 cmH20] 27 cmH20  INTAKE / OUTPUT: I/O last 3 completed shifts: In: 2035.1 [I.V.:395.1; NG/GT:1590; IV Piggyback:50] Out: 2095 [Urine:2095]  PHYSICAL EXAMINATION: General: Intubated, sedated, RASS -1,-2.  + F/C, synchronous Neuro: CNs intact, MAEs HEENT: NCAT, sclerae white Cardiovascular: reg, no M Lungs: prolonged expiratory wheezes, diffuse rhonchi, moderate mucopurulent  secretions Abdomen: obese, soft, diminished BS Ext: warm, no edema Skin: no lesions noted  LABS:  BMET Recent Labs  Lab 10/24/17 0356 10/25/17 0357 10/26/17 0548  NA 140 141 139  K 4.4 4.0 4.4  CL 103 104 100*  CO2 29 30 30   BUN 25* 32* 30*  CREATININE 0.49 0.55 0.61  GLUCOSE 150* 230* 231*    Electrolytes Recent Labs  Lab 10/21/17 0627 10/21/17 1340 10/22/17 0205  10/24/17 0356 10/25/17 0357 10/26/17 0548  CALCIUM 8.3*  --  7.8*   < > 8.3* 8.1* 8.4*  MG 1.5* 2.0 2.1  --   --   --  2.2  PHOS 4.7* 3.7  --   --   --   --  3.1   < > = values in this interval not displayed.    CBC Recent Labs  Lab 10/23/17 0426 10/24/17 0356 10/25/17 0357  WBC 6.5 6.0 5.5  HGB 11.4* 11.2* 11.6*  HCT 35.3  33.7* 34.1* 34.7*  PLT 162 163 175    Coag's Recent Labs  Lab 10/20/17 1514  INR 0.92    Sepsis Markers No results for input(s): LATICACIDVEN, PROCALCITON, O2SATVEN in the last 168 hours.  ABG Recent Labs  Lab 10/21/17 0021 10/21/17 1157 10/22/17 2000  PHART 7.25* 7.28* 7.49*  PCO2ART 76* 77* 40  PO2ART 122* 495* 154*    Liver Enzymes Recent Labs  Lab 10/20/17 1514 10/23/17 0426 10/25/17 0357  AST 23 25 21   ALT 9* 10* 14  ALKPHOS  56 32* 34*  BILITOT 1.0 0.7 0.6  ALBUMIN 4.5 3.2* 2.7*    Cardiac Enzymes Recent Labs  Lab 10/21/17 0627 10/22/17 1428 10/23/17 0225  TROPONINI 0.05* 0.05* 0.04*    Glucose Recent Labs  Lab 10/25/17 1117 10/25/17 1646 10/25/17 2007 10/26/17 0005 10/26/17 0432 10/26/17 0749  GLUCAP 223* 193* 192* 242* 237* 202*    CXR: No edema or infiltrates   ASSESSMENT / PLAN:  PULMONARY  Acute/chronic respiratory failure. Patient with significant secretions, we'll hold off on breathing trial today Status asthmaticus  Cont vent support - settings reviewed and/or adjusted Cont vent bundle Daily SBT if/when meets criteria  Cont systemic steroids Continue nebulized bronchodilators  Cont nebulized steroids    CARDIOVASCULAR A:  New onset AF (11/13) - RVR 11/15 P:  Monitor per ICU protocol Change to full dose anticoagulation Low-dose diltiazem infusion initiated 11/15  RENAL A:   No issues P:   Monitor BMET intermittently Monitor I/Os Correct electrolytes as indicated   GASTROINTESTINAL A:   Obesity P:   SUP: IV famotidine Cont TFs protocol  HEMATOLOGIC A:   Macrocytic anemia - normal serum vitamin B12, RBC folate P:  DVT px: Full dose enoxaparin (for AF) Monitor CBC intermittently Transfuse per usual guidelines   INFECTIOUS A:   Purulent tracheobronchitis P:   Monitor temp, WBC count Micro and abx as above   ENDOCRINE A:   Type II DM - controlled P:   Continue SSI -changed to moderate scale 11/13 Continue Lantus-initiated 11/14  NEUROLOGIC A:   ICU/vent associated discomfort Patient-ventilator dyssynchrony -improved  Myasthenia gravis P:   RASS goal: N-1, -2 Continue PAD protocol - propofol, PRN fentanyl Continue pyridostigmine  FAMILY: Daughter and other family members updated at bedside   CCM time: 55 mins  Ivelis Norgard, DO

## 2017-10-26 NOTE — Progress Notes (Signed)
[place] at [place] NAME: Julia Crawford    MR#:  [id number]  DATE OF BIRTH:  Jul 22, 1935  SUBJECTIVE:  CHIEF COMPLAINT:   Chief Complaint  Patient presents with  . Respiratory Distress   ON full vent support cardizem drip  REVIEW OF SYSTEMS:  Intubated. ROS  DRUG ALLERGIES:   Allergies  Allergen Reactions  . Ace Inhibitors Cough    Other reaction(s): Unknown  . Apixaban     Other reaction(s): Unknown  . Azithromycin     Myasthenia Gravis Patient  . Butylene Glycol Other (See Comments)  . Codeine Itching and Swelling  . Doxycycline   . Lasix [Furosemide] Other (See Comments)    Chest and back pain  . Peanut Butter Flavor     rash?  . Telithromycin     Other reaction(s): Other (See Comments) Myasthenia Gravis Patient    VITALS:  Blood pressure 121/67, pulse 71, temperature 98.8 F (37.1 C), temperature source Oral, resp. rate 15, height 5' (1.524 m), weight 93.4 kg (205 lb 14.6 oz), SpO2 98 %.  PHYSICAL EXAMINATION:  GENERAL:  82 y.o.-year-old patient lying in the bed. sedated EYES: Pupils equal, round, reactive to light and accommodation. No scleral icterus. Extraocular muscles intact.  HEENT: Head atraumatic, normocephalic. Oropharynx and nasopharynx clear.  NECK:  Supple, no jugular venous distention. No thyroid enlargement, no tenderness.  LUNGS: Normal breath sounds bilaterally, wheezing, no crepitation. ETT , on vent support. CARDIOVASCULAR: S1, S2 normal. No murmurs, rubs, or gallops.  ABDOMEN: Soft, nontender, nondistended. Bowel sounds present. No organomegaly or mass.  EXTREMITIES: No pedal edema, cyanosis, or clubbing.  NEUROLOGIC: sedated on vent support.  PSYCHIATRIC: sedated on vent support.  SKIN: No obvious rash, lesion, or ulcer.   Physical Exam LABORATORY PANEL:   CBC Recent Labs  Lab 10/25/17 0357  WBC 5.5  HGB 11.6*  HCT 34.7*  PLT 175    ------------------------------------------------------------------------------------------------------------------  Chemistries  Recent Labs  Lab 10/25/17 0357 10/26/17 0548  NA 141 139  K 4.0 4.4  CL 104 100*  CO2 30 30  GLUCOSE 230* 231*  BUN 32* 30*  CREATININE 0.55 0.61  CALCIUM 8.1* 8.4*  MG  --  2.2  AST 21  --   ALT 14  --   ALKPHOS 34*  --   BILITOT 0.6  --    ------------------------------------------------------------------------------------------------------------------  Cardiac Enzymes Recent Labs  Lab 10/22/17 1428 10/23/17 0225  TROPONINI 0.05* 0.04*   ------------------------------------------------------------------------------------------------------------------  RADIOLOGY:  Dg Chest Port 1 View  Result Date: 10/25/2017 CLINICAL DATA:  Hypoxia EXAM: PORTABLE CHEST 1 VIEW COMPARISON:  October 24, 2017 FINDINGS: Endotracheal tube tip is 2.4 cm above the carina. Nasogastric tube tip and side port are below the diaphragm. Central catheter tip is at the cavoatrial junction. No pneumothorax. There is mild interstitial edema. There is no airspace consolidation. There is a small left pleural effusion. There is cardiomegaly with mild pulmonary venous hypertension. There is aortic atherosclerosis. No adenopathy. No bone lesions. IMPRESSION: Tube and catheter positions as described without pneumothorax. Mild interstitial edema with small left pleural effusion. There is pulmonary vascular congestion. There is aortic atherosclerosis. Aortic Atherosclerosis (ICD10-I70.0). Electronically Signed   By: Lowella Grip III M.D.   On: 10/25/2017 07:36    ASSESSMENT AND PLAN:   Active Problems:   Acute on chronic respiratory failure with hypercapnia (HCC)   Dyspnea  1.  Respiratory failure: Acute on chronic with hypoxia and hypercapnia.  Status asthamaticus, tracheobronchitis Was on BiPAP and had to be intubated.  IV abx, steroids, Nebs ON full vent  support  2.  CAD: Stable; continue aspirin  3.  Atrial fibrillation: Rate controlled; continue diltiazem drip  4.  CHF: Systolic; chronic.  Continue Lasix per home regimen.  BNP is only mildly elevated.  No rales on physical exam. 5.  Diabetes mellitus type 2: keep on ISS. 6. Hyperlipidemia: Continue statin therapy 7.  Myasthenia gravis: Stable; continue pyridostigmine. 8.  Depression: Continue sertraline 9.  DVT prophylaxis: Lovenox   Discussed with Dr. Jefferson Fuel.  CODE STATUS: DNR  TOTAL TIME TAKING CARE OF THIS PATIENT: 25 minutes.    Neita Carp M.D on 10/26/2017   Between 7am to 6pm - Pager - [phone]  After 6pm go to [email or web address] - password [email or web address] [facility]  Office  [phone]  CC: Primary care physician; Jerrol Banana., MD  Note: This dictation was prepared with Dragon dictation along with smaller phrase technology. Any transcriptional errors that result from this process are unintentional.

## 2017-10-27 ENCOUNTER — Inpatient Hospital Stay: Payer: Medicare Other

## 2017-10-27 LAB — BASIC METABOLIC PANEL
Anion gap: 8 (ref 5–15)
BUN: 29 mg/dL — ABNORMAL HIGH (ref 6–20)
CHLORIDE: 99 mmol/L — AB (ref 101–111)
CO2: 30 mmol/L (ref 22–32)
CREATININE: 0.53 mg/dL (ref 0.44–1.00)
Calcium: 8.4 mg/dL — ABNORMAL LOW (ref 8.9–10.3)
GFR calc non Af Amer: >60 mL/min (ref >60)
Glucose, Bld: 219 mg/dL — ABNORMAL HIGH (ref 65–99)
Potassium: 4.3 mmol/L (ref 3.5–5.1)
SODIUM: 137 mmol/L (ref 135–145)

## 2017-10-27 LAB — CULTURE, RESPIRATORY W GRAM STAIN: Culture: NORMAL

## 2017-10-27 LAB — BLOOD GAS, ARTERIAL
Acid-Base Excess: 6.6 mmol/L — ABNORMAL HIGH (ref 0.0–2.0)
BICARBONATE: 31.3 mmol/L — AB (ref 20.0–28.0)
FIO2: 0.3
LHR: 15 {breaths}/min
MECHVT: 500 mL
O2 Saturation: 97.6 %
PATIENT TEMPERATURE: 37
PEEP/CPAP: 5 cmH2O
PO2 ART: 93 mmHg (ref 83.0–108.0)
pCO2 arterial: 44 mmHg (ref 32.0–48.0)
pH, Arterial: 7.46 — ABNORMAL HIGH (ref 7.350–7.450)

## 2017-10-27 LAB — CULTURE, RESPIRATORY

## 2017-10-27 LAB — CBC
HCT: 35.8 % (ref 35.0–47.0)
HEMOGLOBIN: 11.9 g/dL — AB (ref 12.0–16.0)
MCH: 34.8 pg — ABNORMAL HIGH (ref 26.0–34.0)
MCHC: 33.2 g/dL (ref 32.0–36.0)
MCV: 104.7 fL — ABNORMAL HIGH (ref 80.0–100.0)
Platelets: 196 10*3/uL (ref 150–440)
RBC: 3.42 MIL/uL — AB (ref 3.80–5.20)
RDW: 16.9 % — ABNORMAL HIGH (ref 11.5–14.5)
WBC: 6.1 10*3/uL (ref 3.6–11.0)

## 2017-10-27 LAB — GLUCOSE, CAPILLARY
Glucose-Capillary: 189 mg/dL — ABNORMAL HIGH (ref 65–99)
Glucose-Capillary: 191 mg/dL — ABNORMAL HIGH (ref 65–99)
Glucose-Capillary: 205 mg/dL — ABNORMAL HIGH (ref 65–99)
Glucose-Capillary: 216 mg/dL — ABNORMAL HIGH (ref 65–99)
Glucose-Capillary: 216 mg/dL — ABNORMAL HIGH (ref 65–99)
Glucose-Capillary: 217 mg/dL — ABNORMAL HIGH (ref 65–99)

## 2017-10-27 MED ORDER — POLYETHYLENE GLYCOL 3350 17 G PO PACK
17.0000 g | PACK | Freq: Once | ORAL | Status: AC
  Administered 2017-10-27: 17 g via ORAL
  Filled 2017-10-27: qty 1

## 2017-10-27 MED ORDER — ENOXAPARIN SODIUM 120 MG/0.8ML ~~LOC~~ SOLN
95.0000 mg | Freq: Two times a day (BID) | SUBCUTANEOUS | Status: DC
  Administered 2017-10-27 – 2017-10-29 (×4): 95 mg via SUBCUTANEOUS
  Filled 2017-10-27 (×4): qty 0.8

## 2017-10-27 NOTE — Progress Notes (Signed)
[place] for Lovenox Indication: atrial fibrillation  Allergies  Allergen Reactions  . Ace Inhibitors Cough    Other reaction(s): Unknown  . Apixaban     Other reaction(s): Unknown  . Azithromycin     Myasthenia Gravis Patient  . Butylene Glycol Other (See Comments)  . Codeine Itching and Swelling  . Doxycycline   . Lasix [Furosemide] Other (See Comments)    Chest and back pain  . Peanut Butter Flavor     rash?  . Telithromycin     Other reaction(s): Other (See Comments) Myasthenia Gravis Patient    Patient Measurements: Height: 5' (152.4 cm) Weight: 212 lb 4.9 oz (96.3 kg) IBW/kg (Calculated) : 45.5  Vital Signs: Temp: 98.2 F (36.8 C) (11/18 1200) Temp Source: Axillary (11/18 1200) BP: 116/83 (11/18 1100) Pulse Rate: 86 (11/18 1100)  Labs: Recent Labs    10/25/17 0357 10/26/17 0548 10/27/17 0415  HGB 11.6*  --  11.9*  HCT 34.7*  --  35.8  PLT 175  --  196  CREATININE 0.55 0.61 0.53    Estimated Creatinine Clearance: 58.3 mL/min (by C-G formula based on SCr of 0.53 mg/dL).   Medical History: Past Medical History:  Diagnosis Date  . Arthritis   . Asthma   . Atrial fibrillation ([place]) 04/14/2015  . COPD (chronic obstructive pulmonary disease) ([place])   . Coronary artery disease   . Diabetes mellitus without complication ([place])   . Emphysema of lung ([place])   . GERD (gastroesophageal reflux disease)   . Hypothyroid   . Irregular heart beat   . Myasthenia gravis [facility])     Assessment: 82 y/o F ventilated for asthma exacerbation with new-onset atrial fibrillation.   CHA2DS2/VAS Stroke Risk Points      6 >= 2 Points: High Risk  1 - 1.99 Points: Medium Risk  0 Points: Low Risk    This is the only CHA2DS2/VAS Stroke Risk Points available for the past  year.:  Change: N/A     Details    This score determines the patient's risk of having a stroke if the  patient has atrial fibrillation.       Points Metrics  1 Has  Congestive Heart Failure:  Yes   0 Has Vascular Disease:  No   1 Has Hypertension:  Yes   2 Age:  71   1 Has Diabetes:  Yes   0 Had Stroke:  No  Had TIA:  No  Had thromboembolism:  No   1 Female:  Yes      Goal of Theray:  Monitor platelets by anticoagulation protocol: Yes   Plan:  Lovenox 95mg  Q12H (1mg /kg, BW = 95kg). Pharmacy will continue to follow and adjust as needed.   Chinita Greenland PharmD Clinical Pharmacist 10/27/2017

## 2017-10-27 NOTE — Progress Notes (Signed)
Pharmacy Antibiotic Note  Julia Crawford is a 81 y.o. female admitted on 10/20/2017 with pneumonia.  Pharmacy has been consulted for Ceftriaxone dosing.  Plan: Day 4 Ceftriaxone 1g Q24H. Pharmacy will continue to follow.    Height: 5' (152.4 cm) Weight: 212 lb 4.9 oz (96.3 kg) IBW/kg (Calculated) : 45.5  Temp (24hrs), Avg:98.2 F (36.8 C), Min:97.8 F (36.6 C), Max:98.6 F (37 C)  Recent Labs  Lab 10/22/17 0205 10/23/17 0426 10/24/17 0356 10/25/17 0357 10/26/17 0548 10/27/17 0415  WBC 7.1 6.5 6.0 5.5  --  6.1  CREATININE 0.82 0.68 0.49 0.55 0.61 0.53    Estimated Creatinine Clearance: 58.3 mL/min (by C-G formula based on SCr of 0.53 mg/dL).    Allergies  Allergen Reactions  . Ace Inhibitors Cough    Other reaction(s): Unknown  . Apixaban     Other reaction(s): Unknown  . Azithromycin     Myasthenia Gravis Patient  . Butylene Glycol Other (See Comments)  . Codeine Itching and Swelling  . Doxycycline   . Lasix [Furosemide] Other (See Comments)    Chest and back pain  . Peanut Butter Flavor     rash?  . Telithromycin     Other reaction(s): Other (See Comments) Myasthenia Gravis Patient    Antimicrobials this admission: Ceftriaxone 11/15 >>    Dose adjustments this admission:   Microbiology results: BCx: NGTD Respiratory Cx: Growth associated with normal flora Induced sputum= + Klebsiella Pneumoniae- Resist to ampicillin MRSA PCR: Negative   Thank you for allowing pharmacy to be a part of this patient's care.  Chinita Greenland PharmD Clinical Pharmacist 10/27/2017

## 2017-10-27 NOTE — Progress Notes (Signed)
PULMONARY / CRITICAL CARE MEDICINE   Name: Julia Crawford MRN: [id number] DOB: 1935-09-29    ADMISSION DATE:  10/20/2017  PT PROFILE:   58 F never smoker with history of DM II, hypothyroidism, myasthenia and asthma admitted via ED with 2-3 days of increasing SOB. Initially required BiPAP in ED. Required intubation 11/12 for persistent bronchospasm, hypercarbia despite BiPAP  MAJOR EVENTS/TEST RESULTS: 11/11 Admitted with dx of acute/chronic hypercarbic respiratory failure due to asthma exacerbation 11/12 intubated 11/13 very tight bronchospasm with very prolonged expiratory time and very high PIPs with pt-vent dyssynchrony requiring NMB.  11/13 developed AF with very slow vent rate. Made DNR in event of cardiac arrest 11/14 Severe but improved bronchospasm. Able to F/C.  11/15 persistent severe bronchospasm.  Tolerated PSV mode only briefly.  Cognition intact on WUA.  Increased airway secretions  INDWELLING DEVICES:: ETT 11/12 >>  L IJ CVL 11/12 >>   MICRO DATA: MRSA PCR 11/11 >> NEG Resp 11/12 >> NOF Blood 11/11 >> NEG Resp 11/15 >>   ANTIMICROBIALS:  Ceftriaxone 11/15 >>     SUBJECTIVE:  No significant events over the last 24 hours. Unsuccessful SAT/SBT  VITAL SIGNS: BP 123/73   Pulse 60   Temp 98.1 F (36.7 C) (Axillary)   Resp 15   Ht 5' (1.524 m)   Wt 96.3 kg (212 lb 4.9 oz)   SpO2 97%   BMI 41.46 kg/m    VENTILATOR SETTINGS: Vent Mode: PRVC FiO2 (%):  [30 %] 30 % Set Rate:  [15 bmp] 15 bmp Vt Set:  [500 mL] 500 mL PEEP:  [5 cmH20] 5 cmH20 Pressure Support:  [5 cmH20-15 cmH20] 5 cmH20 Plateau Pressure:  [22 cmH20-24 cmH20] 23 cmH20  INTAKE / OUTPUT: I/O last 3 completed shifts: In: 1853.4 [I.V.:433.4; NG/GT:1420] Out: 1510 [Urine:1510]  PHYSICAL EXAMINATION: General: Intubated, sedated, RASS -1,-2.  + F/C, synchronous Neuro: CNs intact, MAEs HEENT: NCAT, sclerae white Cardiovascular: reg, no M Lungs: prolonged expiratory wheezes, diffuse rhonchi,  moderate mucopurulent secretions Abdomen: obese, soft, diminished BS Ext: warm, no edema Skin: no lesions noted  LABS:  BMET Recent Labs  Lab 10/25/17 0357 10/26/17 0548 10/27/17 0415  NA 141 139 137  K 4.0 4.4 4.3  CL 104 100* 99*  CO2 30 30 30   BUN 32* 30* 29*  CREATININE 0.55 0.61 0.53  GLUCOSE 230* 231* 219*    Electrolytes Recent Labs  Lab 10/21/17 0627 10/21/17 1340 10/22/17 0205  10/25/17 0357 10/26/17 0548 10/27/17 0415  CALCIUM 8.3*  --  7.8*   < > 8.1* 8.4* 8.4*  MG 1.5* 2.0 2.1  --   --  2.2  --   PHOS 4.7* 3.7  --   --   --  3.1  --    < > = values in this interval not displayed.    CBC Recent Labs  Lab 10/24/17 0356 10/25/17 0357 10/27/17 0415  WBC 6.0 5.5 6.1  HGB 11.2* 11.6* 11.9*  HCT 34.1* 34.7* 35.8  PLT 163 175 196    Coag's Recent Labs  Lab 10/20/17 1514  INR 0.92    Sepsis Markers No results for input(s): LATICACIDVEN, PROCALCITON, O2SATVEN in the last 168 hours.  ABG Recent Labs  Lab 10/21/17 1157 10/22/17 2000 10/27/17 0500  PHART 7.28* 7.49* 7.46*  PCO2ART 77* 40 44  PO2ART 495* 154* 93    Liver Enzymes Recent Labs  Lab 10/20/17 1514 10/23/17 0426 10/25/17 0357  AST 23 25 21   ALT 9* 10*  14  ALKPHOS 56 32* 34*  BILITOT 1.0 0.7 0.6  ALBUMIN 4.5 3.2* 2.7*    Cardiac Enzymes Recent Labs  Lab 10/21/17 0627 10/22/17 1428 10/23/17 0225  TROPONINI 0.05* 0.05* 0.04*    Glucose Recent Labs  Lab 10/26/17 1554 10/26/17 1607 10/26/17 2026 10/27/17 0010 10/27/17 0419 10/27/17 0803  GLUCAP 228* 225* 170* 191* 216* 189*    CXR: No edema or infiltrates   ASSESSMENT / PLAN:  PULMONARY  Acute/chronic respiratory failure. SAT/SPT tried. Still with significant secretions and bronchospastic. Respiratory paradox, will place back on mechanical ventilation and sedation  Cont vent support - settings reviewed and/or adjusted Cont vent bundle Daily SBT if/when meets criteria  Cont systemic  steroids Continue nebulized bronchodilators  Cont nebulized steroids   CARDIOVASCULAR A:  New onset AF (11/13) - RVR 11/15 P:  Monitor per ICU protocol Change to full dose anticoagulation Low-dose diltiazem infusion initiated 11/15  RENAL A:   No issues P:   Monitor BMET intermittently Monitor I/Os Correct electrolytes as indicated   GASTROINTESTINAL A:   Obesity P:   SUP: IV famotidine Cont TFs protocol  HEMATOLOGIC A:   Macrocytic anemia - normal serum vitamin B12, RBC folate P:  DVT px: Full dose enoxaparin (for AF) Monitor CBC intermittently Transfuse per usual guidelines   INFECTIOUS A:   Purulent tracheobronchitis P:   Monitor temp, WBC count Micro and abx as above   ENDOCRINE A:   Type II DM - controlled P:   Continue SSI -changed to moderate scale 11/13 Continue Lantus-initiated 11/14  NEUROLOGIC A:   ICU/vent associated discomfort Patient-ventilator dyssynchrony -improved  Myasthenia gravis P:   RASS goal: N-1, -2 Continue PAD protocol - propofol, PRN fentanyl Continue pyridostigmine   CCM time: 35 mins  Valentine Kuechle, DO

## 2017-10-27 NOTE — Progress Notes (Signed)
[place] at [place] NAME: Julia Crawford    MR#:  [id number]  DATE OF BIRTH:  01-Jul-1935  SUBJECTIVE:  CHIEF COMPLAINT:   Chief Complaint  Patient presents with  . Respiratory Distress   Still needing full vent support  REVIEW OF SYSTEMS:  Intubated. ROS  DRUG ALLERGIES:   Allergies  Allergen Reactions  . Ace Inhibitors Cough    Other reaction(s): Unknown  . Apixaban     Other reaction(s): Unknown  . Azithromycin     Myasthenia Gravis Patient  . Butylene Glycol Other (See Comments)  . Codeine Itching and Swelling  . Doxycycline   . Lasix [Furosemide] Other (See Comments)    Chest and back pain  . Peanut Butter Flavor     rash?  . Telithromycin     Other reaction(s): Other (See Comments) Myasthenia Gravis Patient    VITALS:  Blood pressure 123/73, pulse 60, temperature 98.1 F (36.7 C), temperature source Axillary, resp. rate 15, height 5' (1.524 m), weight 96.3 kg (212 lb 4.9 oz), SpO2 97 %.  PHYSICAL EXAMINATION:  GENERAL:  82 y.o.-year-old patient lying in the bed. sedated EYES: Pupils equal, round, reactive to light and accommodation. No scleral icterus. Extraocular muscles intact.  HEENT: Head atraumatic, normocephalic. Oropharynx and nasopharynx clear.  NECK:  Supple, no jugular venous distention. No thyroid enlargement, no tenderness.  LUNGS: Normal breath sounds bilaterally, wheezing, no crepitation. ETT , on vent support. CARDIOVASCULAR: S1, S2 normal. No murmurs, rubs, or gallops.  ABDOMEN: Soft, nontender, nondistended. Bowel sounds present. No organomegaly or mass.  EXTREMITIES: No pedal edema, cyanosis, or clubbing.  NEUROLOGIC: sedated on vent support.  PSYCHIATRIC: sedated on vent support.  SKIN: No obvious rash, lesion, or ulcer.   Physical Exam LABORATORY PANEL:   CBC Recent Labs  Lab 10/27/17 0415  WBC 6.1  HGB 11.9*  HCT 35.8  PLT 196    ------------------------------------------------------------------------------------------------------------------  Chemistries  Recent Labs  Lab 10/25/17 0357 10/26/17 0548 10/27/17 0415  NA 141 139 137  K 4.0 4.4 4.3  CL 104 100* 99*  CO2 30 30 30   GLUCOSE 230* 231* 219*  BUN 32* 30* 29*  CREATININE 0.55 0.61 0.53  CALCIUM 8.1* 8.4* 8.4*  MG  --  2.2  --   AST 21  --   --   ALT 14  --   --   ALKPHOS 34*  --   --   BILITOT 0.6  --   --    ------------------------------------------------------------------------------------------------------------------  Cardiac Enzymes Recent Labs  Lab 10/22/17 1428 10/23/17 0225  TROPONINI 0.05* 0.04*   ------------------------------------------------------------------------------------------------------------------  RADIOLOGY:  Dg Chest Port 1 View  Result Date: 10/27/2017 CLINICAL DATA:  Acute respiratory failure EXAM: PORTABLE CHEST 1 VIEW COMPARISON:  10/25/2017 FINDINGS: Support devices are stable. Cardiomegaly with vascular congestion and probable mild interstitial edema, asymmetric to the right. No visible effusions or acute bony abnormality. IMPRESSION: Continued vascular congestion and mild interstitial prominence, right greater than left which may reflect mild interstitial edema. Electronically Signed   By: Rolm Baptise M.D.   On: 10/27/2017 08:08    ASSESSMENT AND PLAN:   Active Problems:   Acute on chronic respiratory failure with hypercapnia (HCC)   Dyspnea  1.  Respiratory failure: Acute on chronic with hypoxia and hypercapnia.     Status asthamaticus, tracheobronchitis Was on BiPAP and had to be intubated.  IV abx, steroids, Nebs On full vent support Failing her SBT  2.  CAD: Stable; continue aspirin  3.  Atrial fibrillation: Rate controlled; continue diltiazem drip  4.  CHF: Systolic; chronic.  Continue Lasix per home regimen.  BNP is only mildly elevated.  No rales on physical exam. 5.  Diabetes mellitus  type 2: keep on ISS. 6. Hyperlipidemia: Continue statin therapy 7.  Myasthenia gravis: Stable; continue pyridostigmine. 8.  Depression: Continue sertraline 9.  DVT prophylaxis: Lovenox   Discussed with Dr. Jefferson Fuel.  CODE STATUS: DNR  TOTAL TIME TAKING CARE OF THIS PATIENT: 25 minutes.    Neita Carp M.D on 10/27/2017   Between 7am to 6pm - Pager - [phone]  After 6pm go to [email or web address] - password [email or web address] [facility]  Office  [phone]  CC: Primary care physician; Jerrol Banana., MD  Note: This dictation was prepared with Dragon dictation along with smaller phrase technology. Any transcriptional errors that result from this process are unintentional.

## 2017-10-27 NOTE — Progress Notes (Signed)
Constipation consult:  Patient currently on Senna/docusate 2 tabs po per tube bid. (Increased to 2 tabs on 11/16 in evening).  Full vent support. Tube feedings.  LBM 10/20/17. Spoke with RN- no BM at least in last 2 days while on his shift.  Will order Miralax 1 packet per tube x 1.  Chinita Greenland PharmD Clinical Pharmacist 10/27/2017

## 2017-10-28 ENCOUNTER — Inpatient Hospital Stay: Payer: Medicare Other

## 2017-10-28 LAB — CULTURE, RESPIRATORY W GRAM STAIN

## 2017-10-28 LAB — GLUCOSE, CAPILLARY
Glucose-Capillary: 161 mg/dL — ABNORMAL HIGH (ref 65–99)
Glucose-Capillary: 173 mg/dL — ABNORMAL HIGH (ref 65–99)
Glucose-Capillary: 199 mg/dL — ABNORMAL HIGH (ref 65–99)
Glucose-Capillary: 200 mg/dL — ABNORMAL HIGH (ref 65–99)
Glucose-Capillary: 202 mg/dL — ABNORMAL HIGH (ref 65–99)
Glucose-Capillary: 207 mg/dL — ABNORMAL HIGH (ref 65–99)

## 2017-10-28 LAB — TRIGLYCERIDES
Triglycerides: 86 mg/dL (ref <150)
Triglycerides: 75 mg/dL (ref <150)

## 2017-10-28 MED ORDER — RACEPINEPHRINE HCL 2.25 % IN NEBU
0.2500 mL | INHALATION_SOLUTION | RESPIRATORY_TRACT | Status: DC | PRN
  Administered 2017-10-28: 0.25 mL via RESPIRATORY_TRACT
  Administered 2017-10-28: 0.5 mL via RESPIRATORY_TRACT
  Filled 2017-10-28 (×3): qty 0.5

## 2017-10-28 MED ORDER — LACTATED RINGERS IV SOLN
INTRAVENOUS | Status: DC
  Administered 2017-10-28 – 2017-10-30 (×3): via INTRAVENOUS

## 2017-10-28 MED ORDER — INSULIN ASPART 100 UNIT/ML ~~LOC~~ SOLN
SUBCUTANEOUS | Status: AC
  Administered 2017-10-29: 3 [IU]
  Filled 2017-10-28: qty 1

## 2017-10-28 MED ORDER — METHYLPREDNISOLONE SODIUM SUCC 40 MG IJ SOLR
40.0000 mg | Freq: Two times a day (BID) | INTRAMUSCULAR | Status: AC
  Administered 2017-10-28 – 2017-10-30 (×4): 40 mg via INTRAVENOUS
  Filled 2017-10-28 (×4): qty 1

## 2017-10-28 MED ORDER — SENNOSIDES 8.8 MG/5ML PO SYRP
5.0000 mL | ORAL_SOLUTION | Freq: Two times a day (BID) | ORAL | Status: DC
  Administered 2017-10-28 – 2017-11-08 (×11): 5 mL
  Filled 2017-10-28 (×23): qty 5

## 2017-10-28 MED ORDER — PROPOFOL 1000 MG/100ML IV EMUL
0.0000 ug/kg/min | INTRAVENOUS | Status: DC
  Administered 2017-10-28: 30 ug/kg/min via INTRAVENOUS
  Administered 2017-10-28: 20 ug/kg/min via INTRAVENOUS
  Administered 2017-10-29 (×2): 30 ug/kg/min via INTRAVENOUS
  Administered 2017-10-29 – 2017-10-30 (×2): 20 ug/kg/min via INTRAVENOUS
  Administered 2017-10-30: 25 ug/kg/min via INTRAVENOUS
  Filled 2017-10-28 (×6): qty 100

## 2017-10-28 MED ORDER — PYRIDOSTIGMINE BROMIDE 60 MG PO TABS
60.0000 mg | ORAL_TABLET | Freq: Three times a day (TID) | ORAL | Status: DC
  Administered 2017-10-28 – 2017-10-30 (×5): 60 mg via ORAL
  Filled 2017-10-28 (×8): qty 1

## 2017-10-28 MED ORDER — FENTANYL CITRATE (PF) 100 MCG/2ML IJ SOLN: 50.0000 ug | INTRAMUSCULAR | Status: DC | PRN

## 2017-10-28 MED ORDER — BISACODYL 10 MG RE SUPP
10.0000 mg | Freq: Every day | RECTAL | Status: DC | PRN
  Filled 2017-10-28: qty 1

## 2017-10-28 MED ORDER — PROPOFOL 1000 MG/100ML IV EMUL
INTRAVENOUS | Status: AC
  Administered 2017-10-28: 10 ug/kg/min
  Filled 2017-10-28: qty 100

## 2017-10-28 MED ORDER — METHYLPREDNISOLONE SODIUM SUCC 40 MG IJ SOLR: 40.0000 mg | Freq: Every day | INTRAMUSCULAR | Status: DC

## 2017-10-28 MED ORDER — BUDESONIDE 0.25 MG/2ML IN SUSP
0.2500 mg | Freq: Four times a day (QID) | RESPIRATORY_TRACT | Status: DC
  Administered 2017-10-28 – 2017-11-08 (×45): 0.25 mg via RESPIRATORY_TRACT
  Filled 2017-10-28 (×45): qty 2

## 2017-10-28 MED ORDER — FENTANYL CITRATE (PF) 100 MCG/2ML IJ SOLN
INTRAMUSCULAR | Status: AC
  Administered 2017-10-28: 100 ug
  Filled 2017-10-28: qty 2

## 2017-10-28 MED ORDER — ROCURONIUM BROMIDE 50 MG/5ML IV SOLN
INTRAVENOUS | Status: AC
  Administered 2017-10-28: 50 mg
  Filled 2017-10-28: qty 1

## 2017-10-28 MED ORDER — ETOMIDATE 2 MG/ML IV SOLN
INTRAVENOUS | Status: AC
  Administered 2017-10-28: 20 mg
  Filled 2017-10-28: qty 10

## 2017-10-28 MED ORDER — FAMOTIDINE IN NACL 20-0.9 MG/50ML-% IV SOLN
20.0000 mg | Freq: Two times a day (BID) | INTRAVENOUS | Status: AC
  Administered 2017-10-28 – 2017-10-30 (×5): 20 mg via INTRAVENOUS
  Filled 2017-10-28 (×5): qty 50

## 2017-10-28 MED ORDER — LEVALBUTEROL HCL 1.25 MG/0.5ML IN NEBU
0.6300 mg | INHALATION_SOLUTION | Freq: Four times a day (QID) | RESPIRATORY_TRACT | Status: DC
  Administered 2017-10-28: 0.63 mg via RESPIRATORY_TRACT
  Filled 2017-10-28 (×4): qty 0.26

## 2017-10-28 NOTE — Progress Notes (Signed)
PULMONARY / CRITICAL CARE MEDICINE   Name: Julia Crawford MRN: [id number] DOB: June 08, 1935    ADMISSION DATE:  10/20/2017  PT PROFILE:   41 F never smoker with history of DM II, hypothyroidism, myasthenia and asthma admitted via ED with 2-3 days of increasing SOB. Initially required BiPAP in ED. Required intubation 11/12 for persistent bronchospasm, hypercarbia despite BiPAP  MAJOR EVENTS/TEST RESULTS: 11/11 Admitted with dx of acute/chronic hypercarbic respiratory failure due to asthma exacerbation 11/12 intubated 11/13 very tight bronchospasm with very prolonged expiratory time and very high PIPs with pt-vent dyssynchrony requiring NMB.  11/13 developed AF with very slow vent rate. Made DNR in event of cardiac arrest 11/14 Severe but improved bronchospasm. Able to F/C.  11/15 persistent severe bronchospasm.  Tolerated PSV mode only briefly.  Cognition intact on WUA.  Increased airway secretions 11/18 failed SBT 11/19 Passed SBT and extubated. Tolerated well initially but developed progressive stridor throughout day and re-intubated. Noted to have severe subglottic stenosis upon re-intubation  INDWELLING DEVICES:: ETT 11/12 >> 11/19, 11/19 >>  L IJ CVL 11/12 >>   MICRO DATA: MRSA PCR 11/11 >> NEG Resp 11/12 >> NOF Blood 11/11 >> NEG Resp 11/15 >> Klebsiella pneumoniae  ANTIMICROBIALS:  Ceftriaxone 11/15 >>     SUBJECTIVE:  Cognition intact.  Past SBT this a.m.  Extubated and tolerated well initially.  However, developed progressive stridor throughout the course of the day.  Reintubated.  Noted to have severe subglottic stenosis upon reintubation  VITAL SIGNS: BP 98/80   Pulse 71   Temp 98.4 F (36.9 C) (Oral)   Resp 15   Ht 5' (1.524 m)   Wt 97.3 kg (214 lb 8.1 oz)   SpO2 92%   BMI 41.89 kg/m   HEMODYNAMICS:    VENTILATOR SETTINGS: Vent Mode: PRVC FiO2 (%):  [30 %-60 %] 60 % Set Rate:  [15 bmp] 15 bmp Vt Set:  [450 mL-500 mL] 450 mL PEEP:  [5 cmH20] 5  cmH20 Pressure Support:  [5 cmH20] 5 cmH20 Plateau Pressure:  [19 cmH20-21 cmH20] 19 cmH20  INTAKE / OUTPUT: I/O last 3 completed shifts: In: 1334.7 [I.V.:374.7; NG/GT:810; IV Piggyback:150] Out: 1200 [Urine:1200]  PHYSICAL EXAMINATION: General: RASS 0.  + F/C Neuro: CNs intact, MAEs HEENT: NCAT, sclerae white Cardiovascular: reg, no M Lungs: Mild expiratory wheezes, few rhonchi, minimal secretions Abdomen: obese, soft, diminished BS Ext: warm, no edema Skin: no lesions noted  LABS:  BMET Recent Labs  Lab 10/25/17 0357 10/26/17 0548 10/27/17 0415  NA 141 139 137  K 4.0 4.4 4.3  CL 104 100* 99*  CO2 30 30 30   BUN 32* 30* 29*  CREATININE 0.55 0.61 0.53  GLUCOSE 230* 231* 219*    Electrolytes Recent Labs  Lab 10/22/17 0205  10/25/17 0357 10/26/17 0548 10/27/17 0415  CALCIUM 7.8*   < > 8.1* 8.4* 8.4*  MG 2.1  --   --  2.2  --   PHOS  --   --   --  3.1  --    < > = values in this interval not displayed.    CBC Recent Labs  Lab 10/24/17 0356 10/25/17 0357 10/27/17 0415  WBC 6.0 5.5 6.1  HGB 11.2* 11.6* 11.9*  HCT 34.1* 34.7* 35.8  PLT 163 175 196    Coag's No results for input(s): APTT, INR in the last 168 hours.  Sepsis Markers No results for input(s): LATICACIDVEN, PROCALCITON, O2SATVEN in the last 168 hours.  ABG Recent Labs  Lab 10/22/17 2000 10/27/17 0500  PHART 7.49* 7.46*  PCO2ART 40 44  PO2ART 154* 93    Liver Enzymes Recent Labs  Lab 10/23/17 0426 10/25/17 0357  AST 25 21  ALT 10* 14  ALKPHOS 32* 34*  BILITOT 0.7 0.6  ALBUMIN 3.2* 2.7*    Cardiac Enzymes Recent Labs  Lab 10/22/17 1428 10/23/17 0225  TROPONINI 0.05* 0.04*    Glucose Recent Labs  Lab 10/27/17 1935 10/28/17 0006 10/28/17 0354 10/28/17 0743 10/28/17 1150 10/28/17 1642  GLUCAP 216* 202* 207* 199* 161* 200*    CXR: NNF   ASSESSMENT / PLAN:  PULMONARY A: Acute/chronic respiratory failure Status asthmaticus Failed extubation due to  stridor Subglottic stenosis P:   Cont vent support - settings reviewed and/or adjusted Cont vent bundle Daily SBT if/when meets criteria  Cont systemic steroids Continue nebulized bronchodilators  Cont nebulized steroids  Will discuss with family possible trach tube placement 11/20  CARDIOVASCULAR A:  PAF with RVR P:  Monitor per ICU protocol Cont full dose anticoagulation with enoxaparin Consider resumption of diltiazem infusion -tolerated well previously  RENAL A:   No issues P:   Monitor BMET intermittently Monitor I/Os Correct electrolytes as indicated   GASTROINTESTINAL A:   Obesity P:   SUP: IV famotidine Consider resumption of TFs protocol 11/20  HEMATOLOGIC A:   Macrocytic anemia - normal serum vitamin B12, RBC folate P:  DVT px: Full dose enoxaparin (for AF) Monitor CBC intermittently Transfuse per usual guidelines   INFECTIOUS A:   Klebsiella tracheobronchitis P:   Monitor temp, WBC count Micro and abx as above   ENDOCRINE A:   Type II DM - controlled P:   Continue SSI -changed to moderate scale 11/13 Continue Lantus-initiated 11/14  NEUROLOGIC A:   ICU/vent associated discomfort Myasthenia gravis P:   RASS goal: N-1, -2 Continue PAD protocol - propofol, PRN fentanyl Continue pyridostigmine  FAMILY: Daughter and other family members updated after reintubation.  We discussed goals of care.  They expressed their concern that the patient would not wish to proceed with tracheostomy tube.  However, I conveyed that decision would mean allowing her to die of a condition that is manageable and treatable.  We agreed to discuss this further 11/20.  Depending on the results of that discussion, we will consider ENT consultation for tracheostomy tube placement   CCM time: 60 mins The above time includes time spent in consultation with patient and/or family members and reviewing care plan on multidisciplinary rounds  Merton Border, MD PCCM  service Mobile [phone] Pager [phone] 10/28/2017, 8:08 PM

## 2017-10-28 NOTE — Progress Notes (Signed)
[place] [facility] [place] NAME: Julia Crawford    MR#:  [id number]  DATE OF BIRTH:  1935-01-13  SUBJECTIVE:  CHIEF COMPLAINT:   Chief Complaint  Patient presents with  . Respiratory Distress   SBT. Afebrile  REVIEW OF SYSTEMS:  Intubated. ROS  DRUG ALLERGIES:   Allergies  Allergen Reactions  . Ace Inhibitors Cough    Other reaction(s): Unknown  . Apixaban     Other reaction(s): Unknown  . Azithromycin     Myasthenia Gravis Patient  . Butylene Glycol Other (See Comments)  . Codeine Itching and Swelling  . Doxycycline   . Lasix [Furosemide] Other (See Comments)    Chest and back pain  . Peanut Butter Flavor     rash?  . Telithromycin     Other reaction(s): Other (See Comments) Myasthenia Gravis Patient    VITALS:  Blood pressure 110/76, pulse (!) 59, temperature 97.8 F (36.6 C), temperature source Oral, resp. rate 15, height 5' (1.524 m), weight 97.3 kg (214 lb 8.1 oz), SpO2 97 %.  PHYSICAL EXAMINATION:  GENERAL:  82 y.o.-year-old patient lying in the bed. sedated EYES: Pupils equal, round, reactive to light and accommodation. No scleral icterus. Extraocular muscles intact.  HEENT: Head atraumatic, normocephalic. Oropharynx and nasopharynx clear.  NECK:  Supple, no jugular venous distention. No thyroid enlargement, no tenderness.  LUNGS: Normal breath sounds bilaterally, wheezing, no crepitation. ETT , on vent support. CARDIOVASCULAR: S1, S2 normal. No murmurs, rubs, or gallops.  ABDOMEN: Soft, nontender, nondistended. Bowel sounds present. No organomegaly or mass.  EXTREMITIES: No pedal edema, cyanosis, or clubbing.  NEUROLOGIC: sedated on vent support.  PSYCHIATRIC: sedated on vent support.  SKIN: No obvious rash, lesion, or ulcer.   Physical Exam LABORATORY PANEL:   CBC Recent Labs  Lab 10/27/17 0415  WBC 6.1  HGB 11.9*  HCT 35.8  PLT 196    ------------------------------------------------------------------------------------------------------------------  Chemistries  Recent Labs  Lab 10/25/17 0357 10/26/17 0548 10/27/17 0415  NA 141 139 137  K 4.0 4.4 4.3  CL 104 100* 99*  CO2 30 30 30   GLUCOSE 230* 231* 219*  BUN 32* 30* 29*  CREATININE 0.55 0.61 0.53  CALCIUM 8.1* 8.4* 8.4*  MG  --  2.2  --   AST 21  --   --   ALT 14  --   --   ALKPHOS 34*  --   --   BILITOT 0.6  --   --    ------------------------------------------------------------------------------------------------------------------  Cardiac Enzymes Recent Labs  Lab 10/22/17 1428 10/23/17 0225  TROPONINI 0.05* 0.04*   ------------------------------------------------------------------------------------------------------------------  RADIOLOGY:  Dg Chest Port 1 View  Result Date: 10/27/2017 CLINICAL DATA:  Acute respiratory failure EXAM: PORTABLE CHEST 1 VIEW COMPARISON:  10/25/2017 FINDINGS: Support devices are stable. Cardiomegaly with vascular congestion and probable mild interstitial edema, asymmetric to the right. No visible effusions or acute bony abnormality. IMPRESSION: Continued vascular congestion and mild interstitial prominence, right greater than left which may reflect mild interstitial edema. Electronically Signed   By: Rolm Baptise M.D.   On: 10/27/2017 08:08    ASSESSMENT AND PLAN:   Active Problems:   Acute on chronic respiratory failure with hypercapnia (HCC)   Dyspnea  1.  Respiratory failure: Acute on chronic with hypoxia and hypercapnia.     Status asthamaticus, tracheobronchitis Was on BiPAP and had to be intubated.  IV abx, steroids, Nebs On full vent support Likely extubation later today  2.  CAD:  Stable; continue aspirin  3.  Atrial fibrillation: Rate controlled; continue diltiazem drip  4.  CHF: Systolic; chronic.  Continue Lasix per home regimen.  BNP is only mildly elevated.  No rales on physical exam. 5.   Diabetes mellitus type 2: keep on ISS. 6. Hyperlipidemia: Continue statin therapy 7.  Myasthenia gravis: Stable; continue pyridostigmine. 8.  Depression: Continue sertraline 9.  DVT prophylaxis: Lovenox   Discussed with Dr. Alva Garnet.  CODE STATUS: DNR  TOTAL TIME TAKING CARE OF THIS PATIENT: 25 minutes.    Neita Carp M.D on 10/28/2017   Between 7am to 6pm - Pager - [phone]  After 6pm go to [email or web address] - password [email or web address] [facility]  Office  [phone]  CC: Primary care physician; Jerrol Banana., MD  Note: This dictation was prepared with Dragon dictation along with smaller phrase technology. Any transcriptional errors that result from this process are unintentional.

## 2017-10-28 NOTE — Progress Notes (Signed)
Pharmacy Antibiotic Note  Julia Crawford is a 82 y.o. female admitted on 10/20/2017 with pneumonia.  Pharmacy has been consulted for Ceftriaxone dosing.  Plan: Continue Ceftriaxone 1g Q24H. Patient with Klebsiella Pneumoniae sensitive to cefazolin in sputum. Please consider narrowing as appropriate.    Height: 5' (152.4 cm) Weight: 214 lb 8.1 oz (97.3 kg) IBW/kg (Calculated) : 45.5  Temp (24hrs), Avg:98.2 F (36.8 C), Min:97.7 F (36.5 C), Max:98.9 F (37.2 C)  Recent Labs  Lab 10/22/17 0205 10/23/17 0426 10/24/17 0356 10/25/17 0357 10/26/17 0548 10/27/17 0415  WBC 7.1 6.5 6.0 5.5  --  6.1  CREATININE 0.82 0.68 0.49 0.55 0.61 0.53    Estimated Creatinine Clearance: 58.6 mL/min (by C-G formula based on SCr of 0.53 mg/dL).    Allergies  Allergen Reactions  . Ace Inhibitors Cough    Other reaction(s): Unknown  . Apixaban     Other reaction(s): Unknown  . Azithromycin     Myasthenia Gravis Patient  . Butylene Glycol Other (See Comments)  . Codeine Itching and Swelling  . Doxycycline   . Lasix [Furosemide] Other (See Comments)    Chest and back pain  . Peanut Butter Flavor     rash?  . Telithromycin     Other reaction(s): Other (See Comments) Myasthenia Gravis Patient    Antimicrobials this admission: Ceftriaxone 11/15 >>    Dose adjustments this admission: N/A   Microbiology results: 11/11BCx: no growth x 5 days  11/12 Respiratory Cx: Growth associated with normal flora 11/16 Induced sputum= + Klebsiella Pneumoniae- Resist to ampicillin 11/11MRSA PCR: Negative   Thank you for allowing pharmacy to be a part of this patient's care.  MLS 10/28/2017

## 2017-10-28 NOTE — Progress Notes (Signed)
Constipation consult:  Patient currently on Senna/docusate 2 tabs po per tube bid. (Increased to 2 tabs on 11/16 in evening).  Full vent support. Tube feedings.  LBM 10/20/17. Per rounds, patient abdomen is soft. Patient extubated today.   Will continue to monitor.   MLS 10/28/2017

## 2017-10-28 NOTE — Progress Notes (Signed)
Placed on high fowlers position, cuff deflated, suctioned orally and endotracheally and then extubated to 4 lpm O2 [name] 

## 2017-10-28 NOTE — Progress Notes (Signed)
Patient was working very hard to breath while on BiPAP therefore I got RT involved and issued the help of the charge nurse. I called Dr. Alva Garnet on the phone and spoke to him regarding the patient and he came to bedside and decided to intubate.

## 2017-10-29 ENCOUNTER — Inpatient Hospital Stay: Payer: Medicare Other

## 2017-10-29 DIAGNOSIS — J386 Stenosis of larynx: Secondary | ICD-10-CM

## 2017-10-29 LAB — GLUCOSE, CAPILLARY
Glucose-Capillary: 106 mg/dL — ABNORMAL HIGH (ref 65–99)
Glucose-Capillary: 110 mg/dL — ABNORMAL HIGH (ref 65–99)
Glucose-Capillary: 128 mg/dL — ABNORMAL HIGH (ref 65–99)
Glucose-Capillary: 137 mg/dL — ABNORMAL HIGH (ref 65–99)
Glucose-Capillary: 82 mg/dL (ref 65–99)
Glucose-Capillary: 88 mg/dL (ref 65–99)
Glucose-Capillary: 96 mg/dL (ref 65–99)

## 2017-10-29 LAB — CBC WITH DIFFERENTIAL/PLATELET
BAND NEUTROPHILS: 1 %
BASOS ABS: 0 10*3/uL (ref 0–0.1)
BASOS PCT: 0 %
Blasts: 0 %
EOS ABS: 0 10*3/uL (ref 0–0.7)
Eosinophils Relative: 0 %
HCT: 34.7 % — ABNORMAL LOW (ref 35.0–47.0)
Hemoglobin: 12 g/dL (ref 12.0–16.0)
LYMPHS PCT: 3 %
Lymphs Abs: 0.2 10*3/uL — ABNORMAL LOW (ref 1.0–3.6)
MCH: 37.3 pg — AB (ref 26.0–34.0)
MCHC: 34.6 g/dL (ref 32.0–36.0)
MCV: 107.8 fL — ABNORMAL HIGH (ref 80.0–100.0)
METAMYELOCYTES PCT: 1 %
Monocytes Absolute: 0.5 10*3/uL (ref 0.2–0.9)
Monocytes Relative: 7 %
Myelocytes: 1 %
NEUTROS ABS: 5.9 10*3/uL (ref 1.4–6.5)
NEUTROS PCT: 87 %
OTHER: 0 %
PLATELETS: 222 10*3/uL (ref 150–440)
Promyelocytes Absolute: 0 %
RBC: 3.21 MIL/uL — ABNORMAL LOW (ref 3.80–5.20)
RDW: 16.9 % — ABNORMAL HIGH (ref 11.5–14.5)
Smear Review: ADEQUATE
WBC: 6.6 10*3/uL (ref 3.6–11.0)
nRBC: 1 /100 WBC — ABNORMAL HIGH

## 2017-10-29 LAB — BASIC METABOLIC PANEL
Anion gap: 9 (ref 5–15)
BUN: 31 mg/dL — AB (ref 6–20)
CALCIUM: 8.6 mg/dL — AB (ref 8.9–10.3)
CO2: 28 mmol/L (ref 22–32)
CREATININE: 0.48 mg/dL (ref 0.44–1.00)
Chloride: 102 mmol/L (ref 101–111)
Glucose, Bld: 75 mg/dL (ref 65–99)
Potassium: 4.4 mmol/L (ref 3.5–5.1)
SODIUM: 139 mmol/L (ref 135–145)

## 2017-10-29 MED ORDER — SODIUM CHLORIDE 0.9% FLUSH
10.0000 mL | Freq: Two times a day (BID) | INTRAVENOUS | Status: DC
  Administered 2017-10-29 – 2017-10-31 (×6): 10 mL
  Administered 2017-11-01: 30 mL
  Administered 2017-11-01 – 2017-11-02 (×2): 10 mL
  Administered 2017-11-03 (×2): 30 mL
  Administered 2017-11-03 – 2017-11-08 (×11): 10 mL

## 2017-10-29 MED ORDER — POLYETHYLENE GLYCOL 3350 17 G PO PACK
17.0000 g | PACK | Freq: Once | ORAL | Status: AC
  Administered 2017-10-29: 17 g
  Filled 2017-10-29: qty 1

## 2017-10-29 MED ORDER — LEVALBUTEROL HCL 0.63 MG/3ML IN NEBU
0.6300 mg | INHALATION_SOLUTION | Freq: Four times a day (QID) | RESPIRATORY_TRACT | Status: DC
  Administered 2017-10-29 – 2017-11-08 (×42): 0.63 mg via RESPIRATORY_TRACT
  Filled 2017-10-29 (×42): qty 3

## 2017-10-29 MED ORDER — BISACODYL 10 MG RE SUPP
10.0000 mg | Freq: Every day | RECTAL | Status: DC | PRN
  Administered 2017-10-29 – 2017-11-01 (×2): 10 mg via RECTAL
  Filled 2017-10-29: qty 1

## 2017-10-29 MED ORDER — SODIUM CHLORIDE 0.9% FLUSH: 10.0000 mL | INTRAVENOUS | Status: DC | PRN

## 2017-10-29 NOTE — Progress Notes (Signed)
[place] [facility] [place] NAME: Julia Crawford    MR#:  [id number]  DATE OF BIRTH:  1935-08-09  SUBJECTIVE:  CHIEF COMPLAINT:   Chief Complaint  Patient presents with  . Respiratory Distress   On vent Afebrile  REVIEW OF SYSTEMS:  Intubated. ROS  DRUG ALLERGIES:   Allergies  Allergen Reactions  . Ace Inhibitors Cough    Other reaction(s): Unknown  . Apixaban     Other reaction(s): Unknown  . Azithromycin     Myasthenia Gravis Patient  . Butylene Glycol Other (See Comments)  . Codeine Itching and Swelling  . Doxycycline   . Lasix [Furosemide] Other (See Comments)    Chest and back pain  . Peanut Butter Flavor     rash?  . Telithromycin     Other reaction(s): Other (See Comments) Myasthenia Gravis Patient    VITALS:  Blood pressure 126/78, pulse 78, temperature 98.3 F (36.8 C), temperature source Oral, resp. rate 17, height 5' (1.524 m), weight 97.3 kg (214 lb 8.1 oz), SpO2 96 %.  PHYSICAL EXAMINATION:  GENERAL:  82 y.o.-year-old patient lying in the bed. sedated EYES: Pupils equal, round, reactive to light and accommodation. No scleral icterus. Extraocular muscles intact.  HEENT: Head atraumatic, normocephalic. Oropharynx and nasopharynx clear.  NECK:  Supple, no jugular venous distention. No thyroid enlargement, no tenderness.  LUNGS: Normal breath sounds bilaterally, wheezing, no crepitation. ETT , on vent support. CARDIOVASCULAR: S1, S2 normal. No murmurs, rubs, or gallops.  ABDOMEN: Soft, nontender, nondistended. Bowel sounds present. No organomegaly or mass.  EXTREMITIES: No pedal edema, cyanosis, or clubbing.  NEUROLOGIC: sedated on vent support.  PSYCHIATRIC: sedated on vent support.  SKIN: No obvious rash, lesion, or ulcer.   Physical Exam LABORATORY PANEL:   CBC Recent Labs  Lab 10/29/17 0554  WBC 6.6  HGB 12.0  HCT 34.7*  PLT 222    ------------------------------------------------------------------------------------------------------------------  Chemistries  Recent Labs  Lab 10/25/17 0357 10/26/17 0548  10/29/17 0554  NA 141 139   < > 139  K 4.0 4.4   < > 4.4  CL 104 100*   < > 102  CO2 30 30   < > 28  GLUCOSE 230* 231*   < > 75  BUN 32* 30*   < > 31*  CREATININE 0.55 0.61   < > 0.48  CALCIUM 8.1* 8.4*   < > 8.6*  MG  --  2.2  --   --   AST 21  --   --   --   ALT 14  --   --   --   ALKPHOS 34*  --   --   --   BILITOT 0.6  --   --   --    < > = values in this interval not displayed.   ------------------------------------------------------------------------------------------------------------------  Cardiac Enzymes Recent Labs  Lab 10/22/17 1428 10/23/17 0225  TROPONINI 0.05* 0.04*   ------------------------------------------------------------------------------------------------------------------  RADIOLOGY:  Dg Abd 1 View  Result Date: 10/28/2017 CLINICAL DATA:  Orogastric tube placement EXAM: ABDOMEN - 1 VIEW COMPARISON:  10/21/2017 FINDINGS: Enteric tube tip is in the mid abdomen to the right of midline consistent with location in the distal stomach. Shallow inspiration with atelectasis in the right lung base. Scattered gas and stool in the visualized colon. IMPRESSION: Enteric tube tip in the mid abdomen consistent with location in the distal stomach. Electronically Signed   By: Lucienne Capers M.D.   On: 10/28/2017  21:52   Portable Chest Xray  Result Date: 10/29/2017 CLINICAL DATA:  Respiratory failure EXAM: PORTABLE CHEST 1 VIEW COMPARISON:  Yesterday FINDINGS: Endotracheal tube tip 1 cm above the carina. An orogastric tube reaches the stomach. Left IJ central line with tip at the upper cavoatrial junction. There is increased opacity at the right base. No edema, effusion, or pneumothorax. Cardiomegaly without edema.  No effusion or pneumothorax. IMPRESSION: 1. New orogastric tube in good  position. 2. Endotracheal tube tip 1 cm above the carina. 3. New pneumonia or atelectasis at the right base. Mild retrocardiac atelectasis. Electronically Signed   By: Monte Fantasia M.D.   On: 10/29/2017 08:09   Portable Chest Xray  Result Date: 10/28/2017 CLINICAL DATA:  Intubation, history atrial fibrillation, asthma, COPD, diabetes mellitus, myasthenia gravis, coronary artery disease EXAM: PORTABLE CHEST 1 VIEW COMPARISON:  Portable exam 1734 hours compared 10/27/2017 FINDINGS: Of endotracheal tube projects 2.4 cm above carina. LEFT jugular central venous catheter with tip projecting over SVC. Rotation to the RIGHT. Enlargement of cardiac silhouette with slight vascular congestion. Mediastinal contours normal. Atherosclerotic calcification aorta. Improved pulmonary edema. Minimal subsegmental atelectasis at RIGHT base with mild atelectasis versus consolidation in retrocardiac LEFT lower lobe. Prominence of LEFT hilum may be related to rotation. No gross pleural effusion or pneumothorax. IMPRESSION: Improved aeration since previous exam. Electronically Signed   By: Lavonia Dana M.D.   On: 10/28/2017 17:49    ASSESSMENT AND PLAN:   Active Problems:   Acute on chronic respiratory failure with hypercapnia (HCC)   Dyspnea  1.  Respiratory failure: Acute on chronic with hypoxia and hypercapnia.     Status asthamaticus, tracheobronchitis Was on BiPAP and had to be intubated. Extubated on 10/28/2017, but reintubated due to subglottic stenosis  IV abx, steroids, Nebs On full vent support Plan is for tracheostomy and transfer to [place]  2.  CAD: Stable; continue aspirin  3.  Atrial fibrillation: Rate controlled; continue diltiazem drip  4.  CHF: Systolic; chronic.  Continue Lasix per home regimen.  BNP is only mildly elevated.  No rales on physical exam. 5.  Diabetes mellitus type 2: keep on ISS. 6. Hyperlipidemia: Continue statin therapy 7.  Myasthenia gravis: Stable; continue  pyridostigmine. 8.  Depression: Continue sertraline 9.  DVT prophylaxis: Lovenox  CODE STATUS: DNR  TOTAL TIME TAKING CARE OF THIS PATIENT: 25 minutes.   Neita Carp M.D on 10/29/2017   Between 7am to 6pm - Pager - [phone]  After 6pm go to [email or web address] - password [email or web address] [facility]  Office  [phone]  CC: Primary care physician; Jerrol Banana., MD  Note: This dictation was prepared with Dragon dictation along with smaller phrase technology. Any transcriptional errors that result from this process are unintentional.

## 2017-10-29 NOTE — Care Management (Signed)
LTAC screening requested: Julia Crawford with [facility] and Hotel manager. Patient appears to meet criteria. I have presented offers to daughter Jeannene Patella and she will review both facilities and call RNCM with choice.

## 2017-10-29 NOTE — Anesthesia Preprocedure Evaluation (Addendum)
Anesthesia Evaluation  Patient identified by MRN, date of birth, ID band Patient awake    Reviewed: Allergy & Precautions, NPO status , Patient's Chart, lab work & pertinent test results, reviewed documented beta blocker date and time   Airway Mallampati: III  TM Distance: >3 FB     Dental  (+) Chipped   Pulmonary shortness of breath, asthma , sleep apnea , COPD,           Cardiovascular hypertension, Pt. on medications + CAD and +CHF  + dysrhythmias Atrial Fibrillation      Neuro/Psych  Neuromuscular disease    GI/Hepatic GERD  Medicated,  Endo/Other  diabetes, Type 2Hypothyroidism   Renal/GU      Musculoskeletal  (+) Arthritis ,   Abdominal   Peds  Hematology  (+) anemia ,   Anesthesia Other Findings Hx of CHF.CXR shows infiltrate. Obesity. Myasthenia gravis. DNR, EF 55.  Reproductive/Obstetrics                            Anesthesia Physical Anesthesia Plan  ASA: IV  Anesthesia Plan: General   Post-op Pain Management:    Induction: Intravenous  PONV Risk Score and Plan:   Airway Management Planned: Oral ETT  Additional Equipment:   Intra-op Plan:   Post-operative Plan:   Informed Consent: I have reviewed the patients History and Physical, chart, labs and discussed the procedure including the risks, benefits and alternatives for the proposed anesthesia with the patient or authorized representative who has indicated his/her understanding and acceptance.     Plan Discussed with: CRNA  Anesthesia Plan Comments:         Anesthesia Quick Evaluation

## 2017-10-29 NOTE — Consult Note (Signed)
Julia Crawford, [place] [id number] Nov 16, 1935 Julia Bow, MD  Reason for Consult: Request for tracheostomy tube placement.  HPI: 82 year old female with a history of status asthmaticus who is intubated after adequate airway treatment of the patient was extubated and quickly required reintubation. At the time of reintubation was difficult to place a 7-0 endotracheal tube. It was felt that she likely had some subglottic narrowing due to the recent intubation. Because the tenuous airway ENT was asked for tracheostomy tube placement.  Allergies:  Allergies  Allergen Reactions  . Ace Inhibitors Cough    Other reaction(s): Unknown  . Apixaban     Other reaction(s): Unknown  . Azithromycin     Myasthenia Gravis Patient  . Butylene Glycol Other (See Comments)  . Codeine Itching and Swelling  . Doxycycline   . Lasix [Furosemide] Other (See Comments)    Chest and back pain  . Peanut Butter Flavor     rash?  . Telithromycin     Other reaction(s): Other (See Comments) Myasthenia Gravis Patient    ROS: Unable to perform as she was intubated.  PMH:  Past Medical History:  Diagnosis Date  . Arthritis   . Asthma   . Atrial fibrillation ([place]) 04/14/2015  . COPD (chronic obstructive pulmonary disease) ([place])   . Coronary artery disease   . Diabetes mellitus without complication ([place])   . Emphysema of lung ([place])   . GERD (gastroesophageal reflux disease)   . Hypothyroid   . Irregular heart beat   . Myasthenia gravis ([place])     FH:  Family History  Problem Relation Age of Onset  . CAD Mother   . Diabetes Father   . CAD Brother   . Diabetes Brother     SH:  Social History   Socioeconomic History  . Marital status: Widowed    Spouse name: Not on file  . Number of children: 5  . Years of education: 11th grade  . Highest education level: Not on file  Social Needs  . Financial resource strain: Not on file  . Food insecurity - worry: Not on file  . Food insecurity - inability: Not on  file  . Transportation needs - medical: Not on file  . Transportation needs - non-medical: Not on file  Occupational History  . Occupation: Retired  Tobacco Use  . Smoking status: Never Smoker  . Smokeless tobacco: Never Used  Substance and Sexual Activity  . Alcohol use: No    Alcohol/week: 0.0 oz  . Drug use: No  . Sexual activity: Not Currently  Other Topics Concern  . Not on file  Social History Narrative  . Not on file    PSH:  Past Surgical History:  Procedure Laterality Date  . BREAST BIOPSY    . CATARACT EXTRACTION    . PARTIAL HYSTERECTOMY    . TONSILLECTOMY      Physical  Exam: CN 2-12 grossly intact and symmetric. EAC/TMs normal BL. Patient was intubated oral cavity difficult to examine due to endotracheal tube.. Skin warm and dry. Nasal cavity without polyps or purulence. External nose and ears without masses or lesions. EOMI, PERRLA. Neck supple with no masses or lesions. No previous neck scar is noted she does have a left-sided central line placed. No lymphadenopathy palpated. Thyroid normal with no masses.   A/P: Failure to extubate with tenuous airway agree with plan to proceed with tracheostomy tube placement. I spoke with her daughter in the room today she understands risk and benefits including bleeding  infectious airway obstruction and death and they are eager to proceed. We will tentatively schedule this for tomorrow I spoke with Dr. Richardson Landry who will be in the operating room has agreed to proceed with this procedure.   Kedron Uno T 10/29/2017 10:09 AM

## 2017-10-29 NOTE — Progress Notes (Signed)
Nutrition Follow-up  DOCUMENTATION CODES:   Obesity unspecified  INTERVENTION:  Plan is to hold tube feeds until after tracheostomy tomorrow. Then plan is for placement of 10 French small-bore feeding tube with weighted Dobbhoff tip for tube feeds.  After tracheostomy tube placement, once small-bore feeding tube placed and confirmed, recommend initiating Vital High Protein at 20 mL/hr (480 mL goal daily volume) + Pro-Stat 60 mL BID. Provides 880 kcal, 102 grams protein, 403 mL H2O daily. With current propofol rate provides 1342 kcal daily.  Recommend liquid multivitamin with minerals daily per tube as goal TF regimen does not meet 100% RDIs for vitamins/minerals.  NUTRITION DIAGNOSIS:   Inadequate oral intake related to inability to eat as evidenced by NPO status(patient currently intubated).  Ongoing.  GOAL:   Provide needs based on ASPEN/SCCM guidelines  Not met. Plan is to restart tube feeds after tracheostomy tube placement.  MONITOR:   Vent status, Labs, Weight trends, TF tolerance, I & O's  REASON FOR ASSESSMENT:   Ventilator, Consult Enteral/tube feeding initiation and management  ASSESSMENT:   82 year old female with PMHx of CAD, COPD, arthritis, CHF, DM type 2, myasthenia gravis, hypothyroidism, A-fib, GERD, emphysema of lung, hx of partial hysterectomy who presented with shortness of breath who presented with acute on chronic respiratory failure with hypoxia and hypercapnia and required emergent intubation on 11/12 after failing BiPAP.  -On 11/19 patient passed SBT and was extubated. She later developed progressive stridor and required re-intubation. Severe subglottic stenosis was found on re-intubation. -Plan is for tracheostomy tube placement tomorrow.  Discussed on rounds today. Plan is to hold tube feeds until after tracheostomy tomorrow. After that MD would like 10 French small-bore feeding tube with Dobbhoff weighted tip placed to initiate tube  feeds.  Patient has still not had a bowel movement since 11/11 per chart and it was likely a reported BM that occurred before admission as there is no documented bowel movements since admission.  Access: OGT placed 11/19; terminates in distal stomach per abdominal x-ray 11/19; 62 cm at corner of mouth  MAP: 72-99 mmHg  TF: tube feeds were held morning of 11/19 for extubation  Patient is currently intubated on ventilator support MV: 7.6 L/min Temp (24hrs), Avg:98.5 F (36.9 C), Min:98.3 F (36.8 C), Max:98.7 F (37.1 C)  Propofol: 17.5 ml/hr (462 kcal daily)  Medications reviewed and include: Novolog 0-15 units Q4hrs, Lantus 20 units daily, levothyroxine, methylprednisolone 40 mg Q12hrs, Miralax 17 grams once today per tube, Senokot, ceftriaxone, famotidine, LR @ 50 mL/hr, propofol gtt.  Labs reviewed: CBG 82-106, BUN 31. Triglycerides 75 on 11/19. RBC Folate from 11/14 was WNL at 1020.  I/O: 1400 mL UOP yesterday (only 0.6 mL/kg/hr)  Weight trend: 97.3 kg on 11/19; +6.4 kg from admission  Diet Order:  No diet orders on file  EDUCATION NEEDS:   No education needs have been identified at this time  Skin:  Skin Assessment: Reviewed RN Assessment  Last BM:  10/20/2017  Height:   Ht Readings from Last 1 Encounters:  10/20/17 5' (1.524 m)    Weight:   Wt Readings from Last 1 Encounters:  10/28/17 214 lb 8.1 oz (97.3 kg)    Ideal Body Weight:  45.5 kg  BMI:  Body mass index is 41.89 kg/m.  Estimated Nutritional Needs:   Kcal:  1000-1273 (11-14 kcal/kg)  Protein:  >/= 91 grams (>/= 2 grams/kg IBW)  Fluid:  1.4-1.6 L/day (30-35 mL/kg IBW)  Willey Blade, MS, RD, LDN Office:  [phone] Pager: [phone] After Hours/Weekend Pager: [phone]

## 2017-10-29 NOTE — Care Management (Addendum)
This RNCM received text message from patient's daughter Olin Hauser [phone] requesting transfer to [facility] in [place]- pending insurance.  I have updated Erika with [facility] and she will reach out to [facility].  There may be a 21-day waiting period for LTAC. Per Doroteo Bradford with [facility]- once patient is status post 7-days trach placement- they can start authorization with [facility]. Trach placement planned for tomorrow.

## 2017-10-29 NOTE — Progress Notes (Signed)
PULMONARY / CRITICAL CARE MEDICINE   Name: Julia Crawford MRN: [id number] DOB: October 07, 1935    ADMISSION DATE:  10/20/2017  PT PROFILE:   59 F never smoker with history of DM II, hypothyroidism, myasthenia and asthma admitted via ED with 2-3 days of increasing SOB. Initially required BiPAP in ED. Required intubation 11/12 for persistent bronchospasm, hypercarbia despite BiPAP  MAJOR EVENTS/TEST RESULTS: 11/11 Admitted with dx of acute/chronic hypercarbic respiratory failure due to asthma exacerbation 11/12 intubated 11/13 very tight bronchospasm with very prolonged expiratory time and very high PIPs with pt-vent dyssynchrony requiring NMB.  11/13 developed AF with very slow vent rate. Made DNR in event of cardiac arrest 11/14 Severe but improved bronchospasm. Able to F/C.  11/15 persistent severe bronchospasm.  Tolerated PSV mode only briefly.  Cognition intact on WUA.  Increased airway secretions 11/18 failed SBT 11/19 Passed SBT and extubated. Tolerated well initially but developed progressive stridor throughout day and re-intubated. Noted to have severe subglottic stenosis upon re-intubation 11/20 Family agreed to proceed with trach tube placement. ENT consulted  INDWELLING DEVICES:: ETT 11/12 >> 11/19, 11/19 >>  L IJ CVL 11/12 >>   MICRO DATA: MRSA PCR 11/11 >> NEG Resp 11/12 >> NOF Blood 11/11 >> NEG Resp 11/15 >> Klebsiella pneumoniae  ANTIMICROBIALS:  Ceftriaxone 11/15 >> 11/21    SUBJECTIVE:  RASS -1, -2.  Comfortable and synchronous on vent  VITAL SIGNS: BP 135/75   Pulse 71   Temp 98.5 F (36.9 C) (Oral)   Resp 15   Ht 5' (1.524 m)   Wt 97.3 kg (214 lb 8.1 oz)   SpO2 97%   BMI 41.89 kg/m   HEMODYNAMICS:    VENTILATOR SETTINGS: Vent Mode: PRVC FiO2 (%):  [60 %] 60 % Set Rate:  [15 bmp] 15 bmp Vt Set:  [450 mL-500 mL] 500 mL PEEP:  [5 cmH20] 5 cmH20 Plateau Pressure:  [18 cmH20-19 cmH20] 19 cmH20  INTAKE / OUTPUT: I/O last 3 completed shifts: In:  1806.4 [I.V.:976.4; NG/GT:630; IV Piggyback:200] Out: 2000 [Urine:2000]  PHYSICAL EXAMINATION: General: RASS 0.  + F/C Neuro: CNs intact, MAEs HEENT: NCAT, sclerae white Cardiovascular: reg, no M Lungs: mildly prolonged exp phase, no wheezes Abdomen: obese, soft, diminished BS Ext: warm, no edema Skin: no lesions noted  LABS:  BMET Recent Labs  Lab 10/26/17 0548 10/27/17 0415 10/29/17 0554  NA 139 137 139  K 4.4 4.3 4.4  CL 100* 99* 102  CO2 30 30 28   BUN 30* 29* 31*  CREATININE 0.61 0.53 0.48  GLUCOSE 231* 219* 75    Electrolytes Recent Labs  Lab 10/26/17 0548 10/27/17 0415 10/29/17 0554  CALCIUM 8.4* 8.4* 8.6*  MG 2.2  --   --   PHOS 3.1  --   --     CBC Recent Labs  Lab 10/25/17 0357 10/27/17 0415 10/29/17 0554  WBC 5.5 6.1 6.6  HGB 11.6* 11.9* 12.0  HCT 34.7* 35.8 34.7*  PLT 175 196 222    Coag's No results for input(s): APTT, INR in the last 168 hours.  Sepsis Markers No results for input(s): LATICACIDVEN, PROCALCITON, O2SATVEN in the last 168 hours.  ABG Recent Labs  Lab 10/22/17 2000 10/27/17 0500  PHART 7.49* 7.46*  PCO2ART 40 44  PO2ART 154* 93    Liver Enzymes Recent Labs  Lab 10/23/17 0426 10/25/17 0357  AST 25 21  ALT 10* 14  ALKPHOS 32* 34*  BILITOT 0.7 0.6  ALBUMIN 3.2* 2.7*  Cardiac Enzymes Recent Labs  Lab 10/23/17 0225  TROPONINI 0.04*    Glucose Recent Labs  Lab 10/28/17 1642 10/28/17 2004 10/29/17 0000 10/29/17 0415 10/29/17 0731 10/29/17 1135  GLUCAP 200* 173* 137* 88 82 106*    CXR: RLL infiltrate/atelectasis   ASSESSMENT / PLAN:  PULMONARY A: Acute/chronic respiratory failure Status asthmaticus Failed extubation due to stridor Subglottic stenosis P:   Cont vent support - settings reviewed and/or adjusted Cont vent bundle Daily SBT if/when meets criteria  Cont systemic steroids Continue nebulized bronchodilators  Cont nebulized steroids  ENT consultation for trach tube  placement - planned for 11/21  CARDIOVASCULAR A:  PAF with RVR - rate presently controlled P:  Monitor per ICU protocol Holding anticoagulation for trach tube placement Diltiazem as needed for rate control  RENAL A:   No issues P:   Monitor BMET intermittently Monitor I/Os Correct electrolytes as indicated   GASTROINTESTINAL A:   Obesity P:   SUP: IV famotidine Consider resumption of TFs protocol 11/21 after trach tube placement  HEMATOLOGIC A:   Macrocytic anemia - normal serum vitamin B12, RBC folate P:  DVT px: SCDs (enox on hold) Monitor CBC intermittently Transfuse per usual guidelines   INFECTIOUS A:   Klebsiella tracheobronchitis P:   Monitor temp, WBC count Micro and abx as above  Complete 7 days ceftriaxone  ENDOCRINE A:   Type II DM - controlled P:   Continue SSI -changed to moderate scale 11/13 Continue Lantus-initiated 11/14  NEUROLOGIC A:   ICU/vent associated discomfort Myasthenia gravis P:   RASS goal: -1, -2 Continue PAD protocol - propofol, PRN fentanyl Continue pyridostigmine  FAMILY: Daughter updated at bedside  CCM time: 35 mins The above time includes time spent in consultation with patient and/or family members and reviewing care plan on multidisciplinary rounds  Merton Border, MD PCCM service Mobile [phone] Pager [phone] 10/29/2017, 4:41 PM

## 2017-10-30 ENCOUNTER — Inpatient Hospital Stay: Payer: Medicare Other

## 2017-10-30 ENCOUNTER — Encounter
Admission: EM | Disposition: A | Discharge status: Nursing Home (Includes ICF) | Payer: Self-pay | Source: Home / Self Care | Attending: Internal Medicine

## 2017-10-30 ENCOUNTER — Encounter: Payer: Self-pay | Admitting: Anesthesiology

## 2017-10-30 ENCOUNTER — Inpatient Hospital Stay: Payer: Medicare Other | Admitting: Registered Nurse

## 2017-10-30 DIAGNOSIS — Z930 Tracheostomy status: Secondary | ICD-10-CM

## 2017-10-30 DIAGNOSIS — R29898 Other symptoms and signs involving the musculoskeletal system: Secondary | ICD-10-CM

## 2017-10-30 DIAGNOSIS — Z9911 Dependence on respirator [ventilator] status: Secondary | ICD-10-CM

## 2017-10-30 HISTORY — PX: TRACHEOSTOMY TUBE PLACEMENT: SHX814

## 2017-10-30 LAB — BASIC METABOLIC PANEL
Anion gap: 6 (ref 5–15)
BUN: 25 mg/dL — AB (ref 6–20)
CHLORIDE: 104 mmol/L (ref 101–111)
CO2: 27 mmol/L (ref 22–32)
CREATININE: 0.41 mg/dL — AB (ref 0.44–1.00)
Calcium: 8.4 mg/dL — ABNORMAL LOW (ref 8.9–10.3)
GFR calc Af Amer: >60 mL/min (ref >60)
GFR calc non Af Amer: >60 mL/min (ref >60)
Glucose, Bld: 107 mg/dL — ABNORMAL HIGH (ref 65–99)
POTASSIUM: 4.4 mmol/L (ref 3.5–5.1)
Sodium: 137 mmol/L (ref 135–145)

## 2017-10-30 LAB — CBC
HEMATOCRIT: 34.7 % — AB (ref 35.0–47.0)
Hemoglobin: 11.6 g/dL — ABNORMAL LOW (ref 12.0–16.0)
MCH: 34.8 pg — AB (ref 26.0–34.0)
MCHC: 33.6 g/dL (ref 32.0–36.0)
MCV: 103.5 fL — AB (ref 80.0–100.0)
PLATELETS: 239 10*3/uL (ref 150–440)
RBC: 3.35 MIL/uL — ABNORMAL LOW (ref 3.80–5.20)
RDW: 16.7 % — AB (ref 11.5–14.5)
WBC: 7.6 10*3/uL (ref 3.6–11.0)

## 2017-10-30 LAB — GLUCOSE, CAPILLARY
Glucose-Capillary: 107 mg/dL — ABNORMAL HIGH (ref 65–99)
Glucose-Capillary: 120 mg/dL — ABNORMAL HIGH (ref 65–99)
Glucose-Capillary: 141 mg/dL — ABNORMAL HIGH (ref 65–99)
Glucose-Capillary: 128 mg/dL — ABNORMAL HIGH (ref 65–99)
Glucose-Capillary: 189 mg/dL — ABNORMAL HIGH (ref 65–99)

## 2017-10-30 SURGERY — CREATION, TRACHEOSTOMY
Anesthesia: General

## 2017-10-30 MED ORDER — FAMOTIDINE 20 MG PO TABS: 20.0000 mg | ORAL_TABLET | Freq: Every day | ORAL | Status: DC

## 2017-10-30 MED ORDER — ROCURONIUM BROMIDE 50 MG/5ML IV SOLN
INTRAVENOUS | Status: AC
  Filled 2017-10-30: qty 1

## 2017-10-30 MED ORDER — PREDNISONE 20 MG PO TABS
40.0000 mg | ORAL_TABLET | Freq: Every day | ORAL | Status: DC
  Administered 2017-10-30: 40 mg
  Filled 2017-10-30: qty 2

## 2017-10-30 MED ORDER — EPHEDRINE SULFATE 50 MG/ML IJ SOLN
INTRAMUSCULAR | Status: DC | PRN
  Administered 2017-10-30 (×2): 10 mg via INTRAVENOUS

## 2017-10-30 MED ORDER — FREE WATER: 200.0000 mL | Freq: Three times a day (TID) | Status: DC

## 2017-10-30 MED ORDER — PYRIDOSTIGMINE BROMIDE 60 MG PO TABS
60.0000 mg | ORAL_TABLET | Freq: Three times a day (TID) | ORAL | Status: DC
  Administered 2017-10-30 – 2017-11-01 (×3): 60 mg
  Filled 2017-10-30 (×11): qty 1

## 2017-10-30 MED ORDER — METOPROLOL TARTRATE 5 MG/5ML IV SOLN
INTRAVENOUS | Status: DC | PRN
  Administered 2017-10-30: 3 mg via INTRAVENOUS

## 2017-10-30 MED ORDER — FENTANYL CITRATE (PF) 100 MCG/2ML IJ SOLN
INTRAMUSCULAR | Status: AC
  Filled 2017-10-30: qty 2

## 2017-10-30 MED ORDER — PRO-STAT SUGAR FREE PO LIQD: 30.0000 mL | Freq: Two times a day (BID) | ORAL | Status: DC

## 2017-10-30 MED ORDER — ROCURONIUM BROMIDE 100 MG/10ML IV SOLN
INTRAVENOUS | Status: DC | PRN
  Administered 2017-10-30: 50 mg via INTRAVENOUS

## 2017-10-30 MED ORDER — LIDOCAINE-EPINEPHRINE (PF) 1 %-1:200000 IJ SOLN
10.0000 mL | Freq: Once | INTRAMUSCULAR | Status: AC
  Administered 2017-10-30: 10 mL via INTRADERMAL
  Filled 2017-10-30: qty 10

## 2017-10-30 MED ORDER — FREE WATER: 120.0000 mL | Freq: Three times a day (TID) | Status: DC

## 2017-10-30 MED ORDER — DEXAMETHASONE SODIUM PHOSPHATE 10 MG/ML IJ SOLN
INTRAMUSCULAR | Status: DC | PRN
  Administered 2017-10-30: 10 mg via INTRAVENOUS

## 2017-10-30 MED ORDER — VITAL AF 1.2 CAL PO LIQD
1000.0000 mL | ORAL | Status: DC
  Administered 2017-10-30: 1000 mL

## 2017-10-30 MED ORDER — PHENYLEPHRINE HCL 10 MG/ML IJ SOLN
INTRAMUSCULAR | Status: DC | PRN
  Administered 2017-10-30 (×2): 200 ug via INTRAVENOUS
  Administered 2017-10-30: 100 ug via INTRAVENOUS
  Administered 2017-10-30: 200 ug via INTRAVENOUS

## 2017-10-30 MED ORDER — FENTANYL CITRATE (PF) 100 MCG/2ML IJ SOLN
INTRAMUSCULAR | Status: DC | PRN
  Administered 2017-10-30: 100 ug via INTRAVENOUS

## 2017-10-30 MED ORDER — DEXAMETHASONE SODIUM PHOSPHATE 10 MG/ML IJ SOLN
INTRAMUSCULAR | Status: AC
  Filled 2017-10-30: qty 1

## 2017-10-30 MED ORDER — ONDANSETRON HCL 4 MG/2ML IJ SOLN
INTRAMUSCULAR | Status: AC
  Filled 2017-10-30: qty 2

## 2017-10-30 MED ORDER — VITAL HIGH PROTEIN PO LIQD: 1000.0000 mL | ORAL | Status: DC

## 2017-10-30 MED ORDER — ATROPINE SULFATE 1 MG/10ML IJ SOSY
PREFILLED_SYRINGE | INTRAMUSCULAR | Status: AC
  Filled 2017-10-30: qty 10

## 2017-10-30 MED ORDER — FENTANYL CITRATE (PF) 100 MCG/2ML IJ SOLN
12.5000 ug | INTRAMUSCULAR | Status: DC | PRN
  Administered 2017-11-01: 25 ug via INTRAVENOUS
  Filled 2017-10-30: qty 2

## 2017-10-30 SURGICAL SUPPLY — 26 items
BLADE SURG 15 STRL LF DISP TIS (BLADE) ×1 IMPLANT
BLADE SURG 15 STRL SS (BLADE) ×2
BLADE SURG SZ11 CARB STEEL (BLADE) ×3 IMPLANT
CANISTER SUCT 1200ML W/VALVE (MISCELLANEOUS) ×3 IMPLANT
ELECT REM PT RETURN 9FT ADLT (ELECTROSURGICAL) ×3
ELECTRODE REM PT RTRN 9FT ADLT (ELECTROSURGICAL) ×1 IMPLANT
GAUZE PACKING IODOFORM 1/2 (PACKING) IMPLANT
GLOVE BIO SURGEON STRL SZ7.5 (GLOVE) ×3 IMPLANT
GOWN STRL REUS W/ TWL LRG LVL3 (GOWN DISPOSABLE) ×2 IMPLANT
GOWN STRL REUS W/TWL LRG LVL3 (GOWN DISPOSABLE) ×4
HEMOSTAT SURGICEL 2X3 (HEMOSTASIS) IMPLANT
KIT RM TURNOVER STRD PROC AR (KITS) ×3 IMPLANT
LABEL OR SOLS (LABEL) ×3 IMPLANT
NS IRRIG 500ML POUR BTL (IV SOLUTION) ×3 IMPLANT
PACK HEAD/NECK (MISCELLANEOUS) ×3 IMPLANT
SHEARS HARMONIC 9CM CVD (BLADE) ×3 IMPLANT
SPONGE DRAIN TRACH 4X4 STRL 2S (GAUZE/BANDAGES/DRESSINGS) ×3 IMPLANT
SPONGE KITTNER 5P (MISCELLANEOUS) ×3 IMPLANT
SPONGE VERSALON 4X4 4PLY (MISCELLANEOUS) ×3 IMPLANT
SUCTION FRAZIER HANDLE 10FR (MISCELLANEOUS)
SUCTION TUBE FRAZIER 10FR DISP (MISCELLANEOUS) IMPLANT
SUT SILK 2 0 (SUTURE) ×4
SUT SILK 2 0 SH (SUTURE) ×12 IMPLANT
SUT SILK 2-0 30XBRD TIE 12 (SUTURE) ×2 IMPLANT
SUT VIC AB 3-0 PS2 18 (SUTURE) IMPLANT
TUBE TRACH SHILEY  6 DIST  CUF (TUBING) ×3 IMPLANT

## 2017-10-30 NOTE — Progress Notes (Addendum)
Copious large amount of thick blood noted all around trach site. Patient responds to questions appropriately. She states she is not having any problems breathing. Vitals stable. Patient in no distress. Vent numbers appropriate. Very little secretions suctioned from trach. Agricultural consultant and NP at bedside for assessment as well. Some bleeding noted from patient's nose also

## 2017-10-30 NOTE — Progress Notes (Signed)
Nutrition Follow-up  DOCUMENTATION CODES:   Obesity unspecified  INTERVENTION:  Provide Vital AF 1.2 at 60 mL/hr (1440 mL goal daily volume) per NGT. Goal regimen provides 1728 kcal, 108 grams protein, 1166 mL H2O daily.  With current free water flush order of 200 mL Q8hrs, patient will receive total of 1766 mL H2O including water in tube feeding.   Recommend decreasing free water flush to 120 mL Q8hrs. Will provide 1526 mL H2O daily including water in tube feeding.  NUTRITION DIAGNOSIS:   Inadequate oral intake related to inability to eat as evidenced by NPO status.  Ongoing.  GOAL:   Provide needs based on ASPEN/SCCM guidelines  Addressing with re-initiation of tube feeds.  MONITOR:   Vent status, Labs, Weight trends, TF tolerance, I & O's  REASON FOR ASSESSMENT:   Ventilator, Consult Enteral/tube feeding initiation and management  ASSESSMENT:   82 year old female with PMHx of CAD, COPD, arthritis, CHF, DM type 2, myasthenia gravis, hypothyroidism, A-fib, GERD, emphysema of lung, hx of partial hysterectomy who presented with shortness of breath who presented with acute on chronic respiratory failure with hypoxia and hypercapnia and required emergent intubation on 11/12 after failing BiPAP.  -Patient s/p tracheostomy tube placement 11/21.  Patient awake and alert at time of assessment. Currently on tracheostomy collar with 10 L O2/minute and FiO2 45%.  Access: 10 French NGT with weighted Dobbhoff tip placed 10/30/2017; terminates in proximal stomach per abdominal x-ray 11/21; 78 cm at left nare  MAP: 76-88 mmHg  Medications reviewed and include: famotidine, Novolog 0-15 units Q4hrs, Lantus 20 units daily, levothyroxine, methylprednisolone 40 mg Q12hrs, prednisone 40 mg daily, Senokot, ceftriaxone, famotidine.  Labs reviewed: CBG 107-141, BUN 25, Creatinine 0.41.  Discussed with RN. Patient has been on trach collar since early afternoon.  Diet Order:  No diet  orders on file  EDUCATION NEEDS:   No education needs have been identified at this time  Skin:  Skin Assessment: Reviewed RN Assessment  Last BM:  10/20/2017  Height:   Ht Readings from Last 1 Encounters:  10/20/17 5' (1.524 m)    Weight:   Wt Readings from Last 1 Encounters:  10/28/17 214 lb 8.1 oz (97.3 kg)    Ideal Body Weight:  45.5 kg  BMI:  Body mass index is 41.89 kg/m.  Estimated Nutritional Needs:   Kcal:  1700-1960 (MSJ x 1.3-1.5)  Protein:  90-110 grams (1-1.2 grams/kg)  Fluid:  1.4-1.6 L/day (30-35 mL/kg IBW)  Willey Blade, MS, RD, LDN Office: [phone] Pager: [phone] After Hours/Weekend Pager: [phone]

## 2017-10-30 NOTE — Procedures (Signed)
10/30/2017  9:25 PM    Julia Crawford  [id number]   Pre-Op Diagnosis:  Bleeding from tracheostomy site  Post-op Diagnosis: Same  Procedure:   Cautery of bleeding wound  Surgeon:  Riley Nearing  Anesthesia:  local  EBL:  Less than 10cc during procedure  Complications:  None  Findings: Small bleeding vessel mid inferior skin edge  Procedure: Nursing called to inform me that patient was having continued oozing from trach site with clots forming. This was evaluated at bedside and after evacuating all of the clot, the source of persistent bleeding was noted to be a small subdermal blood vessel at mid inferior wound edge. The area was injected with 1% lidocaine with epinephrine, 1:100,000. Bipolar cautery was then used to cauterize the wound edge. The remainder of the trach wound bed was carefully inspected and suctioned and no additional site of bleeding was noted. Surgicel was packed around the wound to help reduce any minor oozing. The patient tolerated the procedure well. A new trach tie was placed to further secure the trach, in addition to the existing skin sutures.    Riley Nearing 10/30/2017 9:25 PM

## 2017-10-30 NOTE — Progress Notes (Signed)
Pt weaned off vent for 2.5 hours on trach collar, tolerated well, placed back on vent for increased work of breathing

## 2017-10-30 NOTE — Transfer of Care (Signed)
Immediate Anesthesia Transfer of Care Note  [name]  Procedure(s) Performed: Procedure(s): TRACHEOSTOMY (N/A)  Patient Location: ICU  Anesthesia Type:General  Level of Consciousness: sedated  Airway & Oxygen Therapy: Patient Spontanous Breathing and Patient connected to face mask oxygen  Post-op Assessment: Report given to RN and Post -op Vital signs reviewed and stable  Post vital signs: Reviewed and stable  Last Vitals:  Vitals:   10/30/17 1200 10/30/17 1350  BP: 134/73 (!) 163/98  Pulse: 62   Resp: 15 15  Temp:    SpO2: 80% 998%    Complications: No apparent anesthesia complications

## 2017-10-30 NOTE — Op Note (Signed)
10/30/2017  1:32 PM    Doreen Salvage  [id number]   Pre-Op Diagnosis:  Failure to extubate with tenuous airway  Post-op Diagnosis: Failure to extubate with tenuous airway  Procedure: Elective Tracheostomy, placement of nasogastric Dobbhoff tube  Surgeon:  Riley Nearing  Assistant: none  Anesthesia:  General endotracheal anesthesia  EBL: Less than 10 cc  Complications:  None  Findings: None  Procedure: The patient was taken to the Operating Room from the CCU, already intubated, and placed in the supine position.  After induction of general anesthesia, the patient was placed on a shoulder roll with the neck extended. The skin was injected along the proposed incision line over the trachea with 1% lidocaine with epinephrine, 1: 100,000. The area was then prepped and draped in the usual sterile fashion.  A 15 blade was then used to incise the skin in a horizontal incision over the trachea. The dissection was carried down to the subcutaneous tissues and through the platysma with the Bovie. Anterior jugular veins were divided with the harmonic scalpel for hemostasis. The strap muscles were divided in the midline and retracted laterally. The thyroid isthmus was exposed and divided in the midline over the trachea and dissected away from the anterior aspect of the trachea with the Bovie. With hemostasis obtained, the anesthesiologist was alerted that the airway was about to be entered so that the oxygen concentration could be lowered to reduce fire risk. The scrub tech prepared the tracheostomy tube, confirming no leak in the balloon cuff. The trachea was then incised between the 2nd and third tracheal rings, and an inferiorly based tracheal flap created by cutting through the third tracheal ring laterally. The airway was suctioned and, after the anesthesiologist pulled the endotracheal tube back,  a #6 Shiley  cuffed tracheostomy tube was inserted into the tracheal lumen. The inner cannula was  placed, the cuff inflated,  and the patient hooked to the anesthesia circuit for ventilation. CO2 return and adequate ventilation was confirmed with the anesthesiologist. Hemostasis was confirmed and the flange of the tracheostomy tube was sutured to the skin with 3-0 silk suture.  The balloon on the tube was noted to not hold air adequately.  The sutures were removed, and the tube replaced without difficulty with another #6 Shiley  cuffed tracheostomy tube.  Once again the flange of the tube was secured to the skin with 3-0 silk suture.  A trach tie was placed around the neck to further secure the tracheostomy tube. Betadine soaked gauze was placed around the wound.  A Dobbhoff tube was placed passing it through the left nasal cavity and into the stomach without difficulty.  Her pre-existing orogastric tube was removed.  Proper placement was confirmed by assessing gastric sounds as air was passed into the Dobbhoff tube.  The tube was then secured to the face with tape.  The patient was then returned to the anesthesiologist and taken to the CCU in stable condition.  Disposition:   Return to the CCU  Plan: Routine trach care and suctioning each shift and PRN. Vent settings per CCU admitting physician.  Check placement of Dobbhoff tube with abdominal x-ray prior to use.  Sutures can be removed in a week.  Riley Nearing 10/30/2017 1:32 PM

## 2017-10-30 NOTE — H&P (Signed)
No change in medical status reported by the nursing staff, appears stable for surgery. All questions regarding the procedure answered, and patient (or family if a child) expressed understanding of the procedure.  Julia Crawford @TODAY @

## 2017-10-30 NOTE — Progress Notes (Signed)
[facility] [place] NAME: Julia Crawford    MR#:  [id number]  DATE OF BIRTH:  1935-01-02  SUBJECTIVE:  CHIEF COMPLAINT:   Chief Complaint  Patient presents with  . Respiratory Distress   Continued on vent  Tracheostomy later by ENT  REVIEW OF SYSTEMS:  Intubated. ROS  DRUG ALLERGIES:   Allergies  Allergen Reactions  . Ace Inhibitors Cough    Other reaction(s): Unknown  . Apixaban     Other reaction(s): Unknown  . Azithromycin     Myasthenia Gravis Patient  . Butylene Glycol Other (See Comments)  . Codeine Itching and Swelling  . Doxycycline   . Lasix [Furosemide] Other (See Comments)    Chest and back pain  . Peanut Butter Flavor     rash?  . Telithromycin     Other reaction(s): Other (See Comments) Myasthenia Gravis Patient    VITALS:  Blood pressure 120/76, pulse 78, temperature 98.4 F (36.9 C), temperature source Oral, resp. rate 15, height 5' (1.524 m), weight 97.3 kg (214 lb 8.1 oz), SpO2 95 %.  PHYSICAL EXAMINATION:  GENERAL:  82 y.o.-year-old patient lying in the bed. sedated EYES: Pupils equal, round, reactive to light and accommodation. No scleral icterus. Extraocular muscles intact.  HEENT: Head atraumatic, normocephalic. Oropharynx and nasopharynx clear.  NECK:  Supple, no jugular venous distention. No thyroid enlargement, no tenderness.  LUNGS: Normal breath sounds bilaterally, wheezing, no crepitation. ETT , on vent support. CARDIOVASCULAR: S1, S2 normal. No murmurs, rubs, or gallops.  ABDOMEN: Soft, nontender, nondistended. Bowel sounds present. No organomegaly or mass.  EXTREMITIES: No pedal edema, cyanosis, or clubbing.  NEUROLOGIC: sedated on vent support.  PSYCHIATRIC: sedated on vent support.  SKIN: No obvious rash, lesion, or ulcer.   Physical Exam LABORATORY PANEL:   CBC Recent Labs  Lab 10/30/17 0508  WBC 7.6  HGB 11.6*  HCT 34.7*  PLT 239    ------------------------------------------------------------------------------------------------------------------  Chemistries  Recent Labs  Lab 10/25/17 0357 10/26/17 0548  10/30/17 0508  NA 141 139   < > 137  K 4.0 4.4   < > 4.4  CL 104 100*   < > 104  CO2 30 30   < > 27  GLUCOSE 230* 231*   < > 107*  BUN 32* 30*   < > 25*  CREATININE 0.55 0.61   < > 0.41*  CALCIUM 8.1* 8.4*   < > 8.4*  MG  --  2.2  --   --   AST 21  --   --   --   ALT 14  --   --   --   ALKPHOS 34*  --   --   --   BILITOT 0.6  --   --   --    < > = values in this interval not displayed.   ------------------------------------------------------------------------------------------------------------------  Cardiac Enzymes No results for input(s): TROPONINI in the last 168 hours. ------------------------------------------------------------------------------------------------------------------  RADIOLOGY:  Dg Abd 1 View  Result Date: 10/28/2017 CLINICAL DATA:  Orogastric tube placement EXAM: ABDOMEN - 1 VIEW COMPARISON:  10/21/2017 FINDINGS: Enteric tube tip is in the mid abdomen to the right of midline consistent with location in the distal stomach. Shallow inspiration with atelectasis in the right lung base. Scattered gas and stool in the visualized colon. IMPRESSION: Enteric tube tip in the mid abdomen consistent with location in the distal stomach. Electronically Signed   By: Lucienne Capers M.D.   On:  10/28/2017 21:52   Portable Chest Xray  Result Date: 10/29/2017 CLINICAL DATA:  Respiratory failure EXAM: PORTABLE CHEST 1 VIEW COMPARISON:  Yesterday FINDINGS: Endotracheal tube tip 1 cm above the carina. An orogastric tube reaches the stomach. Left IJ central line with tip at the upper cavoatrial junction. There is increased opacity at the right base. No edema, effusion, or pneumothorax. Cardiomegaly without edema.  No effusion or pneumothorax. IMPRESSION: 1. New orogastric tube in good position. 2.  Endotracheal tube tip 1 cm above the carina. 3. New pneumonia or atelectasis at the right base. Mild retrocardiac atelectasis. Electronically Signed   By: Monte Fantasia M.D.   On: 10/29/2017 08:09   Portable Chest Xray  Result Date: 10/28/2017 CLINICAL DATA:  Intubation, history atrial fibrillation, asthma, COPD, diabetes mellitus, myasthenia gravis, coronary artery disease EXAM: PORTABLE CHEST 1 VIEW COMPARISON:  Portable exam 1734 hours compared 10/27/2017 FINDINGS: Of endotracheal tube projects 2.4 cm above carina. LEFT jugular central venous catheter with tip projecting over SVC. Rotation to the RIGHT. Enlargement of cardiac silhouette with slight vascular congestion. Mediastinal contours normal. Atherosclerotic calcification aorta. Improved pulmonary edema. Minimal subsegmental atelectasis at RIGHT base with mild atelectasis versus consolidation in retrocardiac LEFT lower lobe. Prominence of LEFT hilum may be related to rotation. No gross pleural effusion or pneumothorax. IMPRESSION: Improved aeration since previous exam. Electronically Signed   By: Lavonia Dana M.D.   On: 10/28/2017 17:49    ASSESSMENT AND PLAN:   Active Problems:   Acute on chronic respiratory failure with hypercapnia (HCC)   Dyspnea  1.  Respiratory failure: Acute on chronic with hypoxia and hypercapnia.     Status asthamaticus, tracheobronchitis Was on BiPAP and had to be intubated. Extubated on 10/28/2017, but reintubated due to subglottic stenosis  IV abx, steroids, Nebs On full vent support Plan is for tracheostomy and transfer to LTAC when stable  2.  CAD: Stable; continue aspirin  3.  Atrial fibrillation: Rate controlled; continue diltiazem drip  4.  CHF: Systolic; chronic 5.  Diabetes mellitus type 2: keep on ISS. 6. Hyperlipidemia: Continue statin therapy 7.  Myasthenia gravis: Stable; continue pyridostigmine. 8.  Depression: Continue sertraline 9.  DVT prophylaxis: Lovenox  CODE STATUS: DNR  TOTAL  TIME TAKING CARE OF THIS PATIENT: 25 minutes.   Neita Carp M.D on 10/30/2017   Between 7am to 6pm - Pager - [phone]  After 6pm go to [email or web address] - password [email or web address] [facility]  Office  [phone]  CC: Primary care physician; Jerrol Banana., MD  Note: This dictation was prepared with Dragon dictation along with smaller phrase technology. Any transcriptional errors that result from this process are unintentional.

## 2017-10-30 NOTE — Anesthesia Postprocedure Evaluation (Signed)
Anesthesia Post Note  [name]  Procedure(s) Performed: TRACHEOSTOMY (N/A )  Patient location during evaluation: PACU Anesthesia Type: General Level of consciousness: awake and alert Pain management: pain level controlled Vital Signs Assessment: post-procedure vital signs reviewed and stable Respiratory status: spontaneous breathing, nonlabored ventilation, respiratory function stable and patient connected to nasal cannula oxygen Cardiovascular status: blood pressure returned to baseline and stable Postop Assessment: no apparent nausea or vomiting Anesthetic complications: no     Last Vitals:  Vitals:   10/30/17 1702 10/30/17 1804  BP: 115/70   Pulse: 77   Resp: (!) 27   Temp: 37.1 C   SpO2: 92% 99%    Last Pain:  Vitals:   10/30/17 1702  TempSrc: Oral  PainSc:                  Jazyah Butsch S

## 2017-10-30 NOTE — Care Management (Signed)
Trach placed by ENT- Doroteo Bradford with [facility] will seek LTAC authorization. Meticulous trach care instruction needed during hospitalization for patient and family in case she has no opportunity to transition to nursing facility. PT evaluation will also be needed when medically appropriate.

## 2017-10-30 NOTE — Anesthesia Procedure Notes (Addendum)
Date/Time: 10/30/2017 12:30 PM Performed by: Doreen Salvage, CRNA Pre-anesthesia Checklist: Patient identified, Patient being monitored, Timeout performed, Emergency Drugs available and Suction available Patient Re-evaluated:Patient Re-evaluated prior to induction Oxygen Delivery Method: Circle system utilized Preoxygenation: Pre-oxygenation with 100% oxygen Induction Type: IV induction and Inhalational induction Tube type: Oral Tube size: 7.0 mm Placement Confirmation: positive ETCO2 and breath sounds checked- equal and bilateral Tube secured with: Tape Dental Injury: Teeth and Oropharynx as per pre-operative assessment  Comments: Patient arrived with OETT in place

## 2017-10-30 NOTE — Progress Notes (Signed)
PULMONARY / CRITICAL CARE MEDICINE   Name: Julia Crawford MRN: [id number] DOB: 1935/11/22    ADMISSION DATE:  10/20/2017  PT PROFILE:   39 F never smoker with history of DM II, hypothyroidism, myasthenia and asthma admitted via ED with 2-3 days of increasing SOB. Initially required BiPAP in ED. Required intubation 11/12 for persistent bronchospasm, hypercarbia despite BiPAP  MAJOR EVENTS/TEST RESULTS: 11/11 Admitted with dx of acute/chronic hypercarbic respiratory failure due to asthma exacerbation 11/12 intubated 11/13 very tight bronchospasm with very prolonged expiratory time and very high PIPs with pt-vent dyssynchrony requiring NMB.  11/13 developed AF with very slow vent rate. Made DNR in event of cardiac arrest 11/14 Severe but improved bronchospasm. Able to F/C.  11/15 persistent severe bronchospasm.  Tolerated PSV mode only briefly.  Cognition intact on WUA.  Increased airway secretions 11/18 failed SBT 11/19 Passed SBT and extubated. Tolerated well initially but developed progressive stridor throughout day and re-intubated. Noted to have severe subglottic stenosis upon re-intubation 11/20 Family agreed to proceed with trach tube placement. ENT consulted 11/21 Tracheostomy tube placement  INDWELLING DEVICES:: ETT 11/12 >> 11/19, 11/19 >> 11/21 Trach tube 11/21 >>  L IJ CVL 11/12 >>   MICRO DATA: MRSA PCR 11/11 >> NEG Resp 11/12 >> NOF Blood 11/11 >> NEG Resp 11/15 >> Klebsiella pneumoniae  ANTIMICROBIALS:  Ceftriaxone 11/15 >> 11/21    SUBJECTIVE:  RASS 0.  Synchronous on ventilator.  Follows commands.  Cognition appears fully intact.  VITAL SIGNS: BP (!) 163/98 (BP Location: Left Arm)   Pulse 62   Temp 98.4 F (36.9 C) (Oral)   Resp 15   Ht 5' (1.524 m)   Wt 97.3 kg (214 lb 8.1 oz)   SpO2 100%   BMI 41.89 kg/m   HEMODYNAMICS:    VENTILATOR SETTINGS: Vent Mode: PRVC FiO2 (%):  [30 %-60 %] 30 % Set Rate:  [15 bmp] 15 bmp Vt Set:  [500 mL] 500  mL PEEP:  [5 cmH20] 5 cmH20 Plateau Pressure:  [14 cmH20-20 cmH20] 14 cmH20  INTAKE / OUTPUT: I/O last 3 completed shifts: In: 2471.8 [I.V.:2271.8; IV Piggyback:200] Out: 1505 [Urine:1505]  PHYSICAL EXAMINATION: General: RASS 0.  + F/C Neuro: CNs intact, MAEs HEENT: NCAT, sclerae white Cardiovascular: reg, no M Lungs: mildly prolonged exp phase, no wheezes Abdomen: obese, soft, diminished BS Ext: warm, no edema Skin: no lesions noted  LABS:  BMET Recent Labs  Lab 10/27/17 0415 10/29/17 0554 10/30/17 0508  NA 137 139 137  K 4.3 4.4 4.4  CL 99* 102 104  CO2 30 28 27   BUN 29* 31* 25*  CREATININE 0.53 0.48 0.41*  GLUCOSE 219* 75 107*    Electrolytes Recent Labs  Lab 10/26/17 0548 10/27/17 0415 10/29/17 0554 10/30/17 0508  CALCIUM 8.4* 8.4* 8.6* 8.4*  MG 2.2  --   --   --   PHOS 3.1  --   --   --     CBC Recent Labs  Lab 10/27/17 0415 10/29/17 0554 10/30/17 0508  WBC 6.1 6.6 7.6  HGB 11.9* 12.0 11.6*  HCT 35.8 34.7* 34.7*  PLT 196 222 239    Coag's No results for input(s): APTT, INR in the last 168 hours.  Sepsis Markers No results for input(s): LATICACIDVEN, PROCALCITON, O2SATVEN in the last 168 hours.  ABG Recent Labs  Lab 10/27/17 0500  PHART 7.46*  PCO2ART 44  PO2ART 93    Liver Enzymes Recent Labs  Lab 10/25/17 0357  AST 21  ALT 14  ALKPHOS 34*  BILITOT 0.6  ALBUMIN 2.7*    Cardiac Enzymes No results for input(s): TROPONINI, PROBNP in the last 168 hours.  Glucose Recent Labs  Lab 10/29/17 1640 10/29/17 2035 10/29/17 2351 10/30/17 0429 10/30/17 0706 10/30/17 1152  GLUCAP 96 128* 110* 107* 120* 141*    CXR: No new film   ASSESSMENT / PLAN:  PULMONARY A: Acute/chronic respiratory failure Status asthmaticus Failed extubation due to stridor Subglottic stenosis P:   Once she returns from tracheostomy tube placement, I expect that she can be rapidly liberated from mechanical ventilation.   CARDIOVASCULAR A:   PAF with RVR - rate presently controlled P:  Monitor per ICU protocol Holding anticoagulation for trach tube placement  -Resume 11/22 if no bleeding complications after trach tube Resume diltiazem as needed for rate control  RENAL A:   No issues P:   Monitor BMET intermittently Monitor I/Os Correct electrolytes as indicated   GASTROINTESTINAL A:   Obesity P:   SUP: IV famotidine  Resume TF's after return from  HEMATOLOGIC A:   Macrocytic anemia - normal serum vitamin B12, RBC folate P:  DVT px: SCDs (enox on hold) Monitor CBC intermittently Transfuse per usual guidelines   INFECTIOUS A:   Klebsiella tracheobronchitis P:   Monitor temp, WBC count Micro and abx as above  Completed 7 days ceftriaxone  ENDOCRINE A:   Type II DM - controlled P:   Continue SSI -changed to moderate scale 11/13 Continue Lantus-initiated 11/14  NEUROLOGIC A:   ICU/vent associated discomfort Myasthenia gravis Deconditioning P:   RASS goal: 0 Continue PAD protocol - propofol, PRN fentanyl Will likely discontinue propofol infusion after trach tube placement Continue pyridostigmine PT evaluation ordered Advance activity as able Discharge planning -might need LTAC  FAMILY: No family at bedside this a.m.  CCM time: 35 mins The above time includes time spent in consultation with patient and/or family members and reviewing care plan on multidisciplinary rounds  Merton Border, MD PCCM service Mobile [phone] Pager [phone] 10/30/2017, 2:00 PM

## 2017-10-30 NOTE — Anesthesia Post-op Follow-up Note (Signed)
Anesthesia QCDR form completed.        

## 2017-10-31 LAB — GLUCOSE, CAPILLARY
Glucose-Capillary: 107 mg/dL — ABNORMAL HIGH (ref 65–99)
Glucose-Capillary: 120 mg/dL — ABNORMAL HIGH (ref 65–99)
Glucose-Capillary: 143 mg/dL — ABNORMAL HIGH (ref 65–99)
Glucose-Capillary: 200 mg/dL — ABNORMAL HIGH (ref 65–99)
Glucose-Capillary: 68 mg/dL (ref 65–99)
Glucose-Capillary: 82 mg/dL (ref 65–99)
Glucose-Capillary: 84 mg/dL (ref 65–99)
Glucose-Capillary: 90 mg/dL (ref 65–99)

## 2017-10-31 MED ORDER — DEXTROSE 50 % IV SOLN
50.0000 mL | Freq: Once | INTRAVENOUS | Status: AC
  Administered 2017-10-31: 50 mL via INTRAVENOUS
  Filled 2017-10-31: qty 50

## 2017-10-31 NOTE — Progress Notes (Signed)
Pt continues to do well on trach collar, site care done and inner cannula changed, cuff deflated on trach. Pt in no respiratory distress. Will continue as tolerated. RN notified.

## 2017-10-31 NOTE — Progress Notes (Signed)
Pt placed on trach collar at 45%, sats 98%, respiratory rate 20/min, HR 100/min, no respiratory distress noted, patient states that he breathing is OK, RN made aware, will continue to monitor

## 2017-10-31 NOTE — Progress Notes (Signed)
CBG 68 Order set in place for hypoglycemia  Will follow through per protocol

## 2017-10-31 NOTE — Progress Notes (Signed)
PULMONARY / CRITICAL CARE MEDICINE   Name: Julia Crawford MRN: [id number] DOB: 1935/02/28    ADMISSION DATE:  10/20/2017  PT PROFILE:   30 F never smoker with history of DM II, hypothyroidism, myasthenia and asthma admitted via ED with 2-3 days of increasing SOB. Initially required BiPAP in ED. Required intubation 11/12 for persistent bronchospasm, hypercarbia despite BiPAP  MAJOR EVENTS/TEST RESULTS: 11/11 Admitted with dx of acute/chronic hypercarbic respiratory failure due to asthma exacerbation 11/12 intubated 11/13 very tight bronchospasm with very prolonged expiratory time and very high PIPs with pt-vent dyssynchrony requiring NMB.  11/13 developed AF with very slow vent rate. Made DNR in event of cardiac arrest 11/14 Severe but improved bronchospasm. Able to F/C.  11/15 persistent severe bronchospasm.  Tolerated PSV mode only briefly.  Cognition intact on WUA.  Increased airway secretions 11/18 failed SBT 11/19 Passed SBT and extubated. Tolerated well initially but developed progressive stridor throughout day and re-intubated. Noted to have severe subglottic stenosis upon re-intubation 11/20 Family agreed to proceed with trach tube placement. ENT consulted 11/21 Tracheostomy tube placement 11/22 no acute events overnight.  INDWELLING DEVICES:: ETT 11/12 >> 11/19, 11/19 >> 11/21 Trach tube 11/21 >>  L IJ CVL 11/12 >>   MICRO DATA: MRSA PCR 11/11 >> NEG Resp 11/12 >> NOF Blood 11/11 >> NEG Resp 11/15 >> Klebsiella pneumoniae  ANTIMICROBIALS:  Ceftriaxone 11/15 >> 11/21    SUBJECTIVE:  Patient opens eyes and follows commands.  Does not appear in acute distress.  VITAL SIGNS: BP 113/78   Pulse (!) 103   Temp 98.6 F (37 C) (Oral)   Resp (!) 38   Ht 5' (1.524 m)   Wt 214 lb 8.1 oz (97.3 kg)   SpO2 98%   BMI 41.89 kg/m   HEMODYNAMICS:    VENTILATOR SETTINGS: Vent Mode: Spontaneous FiO2 (%):  [30 %-45 %] 45 % Set Rate:  [15 bmp] 15 bmp Vt Set:  [500 mL] 500  mL PEEP:  [5 cmH20] 5 cmH20 Pressure Support:  [15 cmH20] 15 cmH20 Plateau Pressure:  [13 cmH20] 13 cmH20  INTAKE / OUTPUT: I/O last 3 completed shifts: In: 1768.6 [P.O.:60; I.V.:1388.6; NG/GT:120; IV Piggyback:200] Out: 1505 [Urine:1505]  PHYSICAL EXAMINATION: General: RASS 0.  + F/C Neuro: CNs intact, MAEs HEENT: NCAT, sclerae white, trach+ Cardiovascular: reg, no M Lungs: mildly prolonged exp phase, no wheezes Abdomen: obese, soft, diminished BS Ext: warm, no edema Skin: no lesions noted  LABS:  BMET Recent Labs  Lab 10/27/17 0415 10/29/17 0554 10/30/17 0508  NA 137 139 137  K 4.3 4.4 4.4  CL 99* 102 104  CO2 30 28 27   BUN 29* 31* 25*  CREATININE 0.53 0.48 0.41*  GLUCOSE 219* 75 107*    Electrolytes Recent Labs  Lab 10/26/17 0548 10/27/17 0415 10/29/17 0554 10/30/17 0508  CALCIUM 8.4* 8.4* 8.6* 8.4*  MG 2.2  --   --   --   PHOS 3.1  --   --   --     CBC Recent Labs  Lab 10/27/17 0415 10/29/17 0554 10/30/17 0508  WBC 6.1 6.6 7.6  HGB 11.9* 12.0 11.6*  HCT 35.8 34.7* 34.7*  PLT 196 222 239    Coag's No results for input(s): APTT, INR in the last 168 hours.  Sepsis Markers No results for input(s): LATICACIDVEN, PROCALCITON, O2SATVEN in the last 168 hours.  ABG Recent Labs  Lab 10/27/17 0500  PHART 7.46*  PCO2ART 44  PO2ART 93    Liver  Enzymes Recent Labs  Lab 10/25/17 0357  AST 21  ALT 14  ALKPHOS 34*  BILITOT 0.6  ALBUMIN 2.7*    Cardiac Enzymes No results for input(s): TROPONINI, PROBNP in the last 168 hours.  Glucose Recent Labs  Lab 10/30/17 1959 10/31/17 0008 10/31/17 0522 10/31/17 0739 10/31/17 1224 10/31/17 1653  GLUCAP 189* 143* 120* 107* 84 82    CXR: No new film   ASSESSMENT / PLAN:  PULMONARY A: Acute/chronic respiratory failure Status asthmaticus Failed extubation due to stridor Subglottic stenosis Status post tracheostomy P:   Weaning as tolerated.  CARDIOVASCULAR A:  PAF with RVR -  rate presently controlled P:  Monitor per ICU protocol Rate control meds.  Will resume anticoagulation.   RENAL A:   No issues P:   Monitor BMET intermittently Monitor I/Os Correct electrolytes as indicated   GASTROINTESTINAL A:   Obesity P:   SUP: IV famotidine  Resume TF's after return from  HEMATOLOGIC A:   Macrocytic anemia - normal serum vitamin B12, RBC folate P:  DVT px: SCDs (enox on hold) Monitor CBC intermittently Transfuse per usual guidelines   INFECTIOUS A:   Klebsiella tracheobronchitis P:   Monitor temp, WBC count Micro and abx as above  Completed 7 days ceftriaxone  ENDOCRINE A:   Type II DM - controlled P:   Continue SSI -changed to moderate scale 11/13 Continue Lantus-initiated 11/14  NEUROLOGIC A:   ICU/vent associated discomfort Myasthenia gravis Deconditioning P:   RASS goal: 0 Continue PAD protocol - propofol, PRN fentanyl Continue pyridostigmine PT evaluation ordered Advance activity as able Discharge planning -might need LTAC  FAMILY: No family at bedside this a.m.  CCM time: 32 mins The above time includes time spent in consultation with patient and/or family members and reviewing care plan on multidisciplinary rounds  Dimas Chyle MD PCCM

## 2017-11-01 LAB — GLUCOSE, CAPILLARY
Glucose-Capillary: 71 mg/dL (ref 65–99)
Glucose-Capillary: 89 mg/dL (ref 65–99)
Glucose-Capillary: 95 mg/dL (ref 65–99)
Glucose-Capillary: 125 mg/dL — ABNORMAL HIGH (ref 65–99)
Glucose-Capillary: 142 mg/dL — ABNORMAL HIGH (ref 65–99)

## 2017-11-01 MED ORDER — ENOXAPARIN SODIUM 100 MG/ML ~~LOC~~ SOLN
95.0000 mg | Freq: Two times a day (BID) | SUBCUTANEOUS | Status: DC
  Administered 2017-11-01 – 2017-11-05 (×10): 95 mg via SUBCUTANEOUS
  Filled 2017-11-01 (×11): qty 1

## 2017-11-01 MED ORDER — FAMOTIDINE IN NACL 20-0.9 MG/50ML-% IV SOLN
20.0000 mg | INTRAVENOUS | Status: DC
  Administered 2017-11-01 – 2017-11-05 (×5): 20 mg via INTRAVENOUS
  Filled 2017-11-01 (×5): qty 50

## 2017-11-01 MED ORDER — METHYLPREDNISOLONE SODIUM SUCC 40 MG IJ SOLR
20.0000 mg | Freq: Every day | INTRAMUSCULAR | Status: DC
  Administered 2017-11-01 – 2017-11-05 (×5): 20 mg via INTRAVENOUS
  Filled 2017-11-01 (×5): qty 1

## 2017-11-01 MED ORDER — DEXTROSE 5 % IV SOLN
INTRAVENOUS | Status: DC
  Administered 2017-11-01 – 2017-11-04 (×4): via INTRAVENOUS

## 2017-11-01 NOTE — Evaluation (Signed)
Physical Therapy Evaluation Patient Details Name: Julia Crawford MRN: [id number] DOB: 13-Jul-1935 Today's Date: 11/01/2017   History of Present Illness  Pt is an 82 yo F never smoker with history of DM II, hypothyroidism, myasthenia and asthma admitted via ED with 2-3 days of increasing SOB and diagnosed with acute on chronic hypercarbic respiratory failure due to asthma exacerbation. Initially required BiPAP in ED. Required intubation 10/21/17 for persistent bronchospasm, hypercarbia despite BiPAP.   Pt extubated 10/28/17 and tolerated well initially but developed progressive stridor and was re-intubated.  Pt noted to have severe subglottic stenosis upon re-intubation and trach tube placement performed 10/29/17.    Clinical Impression  Pt presents with deficits in strength, transfers, mobility, gait, balance, and activity tolerance.  Pt required +2 max A for sup to/from sit with cues for sequencing.  Pt able to sit at EOB with close SBA for 2-3 min but then required min A to prevent posterior LOB.  Pt's significant functional weakness prevented attempts at transfers/gait at this time.  Pt's SpO2 and HR WFL during session.  Pt will benefit from PT services in a SNF setting upon discharge to safely address above deficits for decreased caregiver assistance and eventual return to PLOF.      Follow Up Recommendations SNF    Equipment Recommendations  Other (comment)(TBD at next venue of care)    Recommendations for Other Services OT consult     Precautions / Restrictions Precautions Precautions: Fall Restrictions Weight Bearing Restrictions: No Other Position/Activity Restrictions: HOB elevated >/= 30 deg      Mobility  Bed Mobility Overal bed mobility: Needs Assistance Bed Mobility: Rolling;Supine to Sit;Sit to Supine Rolling: +2 for physical assistance;Max assist   Supine to sit: +2 for physical assistance;Max assist Sit to supine: Max assist   General bed mobility comments: Pt  attempts to participate with bed mobility tasks but functionally very weak  Transfers                 General transfer comment: Unable/unsafe to attempt  Ambulation/Gait             General Gait Details: Unable/unsafe to attempt  Stairs            Wheelchair Mobility    Modified Rankin (Stroke Patients Only)       Balance Overall balance assessment: Needs assistance Sitting-balance support: Feet supported;Bilateral upper extremity supported Sitting balance-Leahy Scale: Poor Sitting balance - Comments: Pt's sitting balance at EOB fair initially but after sitting 2-3 min pt required min A to prevent posterior LOB Postural control: Posterior lean     Standing balance comment: Unable                             Pertinent Vitals/Pain Pain Assessment: No/denies pain    Home Living Family/patient expects to be discharged to:: Assisted living(History by phone from daughter with pt's permission and verified by reading back to pt) Living Arrangements: Alone             Home Equipment: Walker - 4 wheels;Cane - single point      Prior Function Level of Independence: Needs assistance   Gait / Transfers Assistance Needed: Pt Mod I with amb facility distances with rollator with no fall history; pt Ind with bed mobility and transfers  ADL's / Homemaking Assistance Needed: Pt dresses independently but daughter assists with bathing and meds        Hand Dominance  Dominant Hand: Right    Extremity/Trunk Assessment   Upper Extremity Assessment Upper Extremity Assessment: Generalized weakness    Lower Extremity Assessment Lower Extremity Assessment: Generalized weakness       Communication   Communication: Tracheostomy  Cognition Arousal/Alertness: Awake/alert Behavior During Therapy: WFL for tasks assessed/performed Overall Cognitive Status: Within Functional Limits for tasks assessed                                         General Comments      Exercises Total Joint Exercises Ankle Circles/Pumps: AAROM;Both;5 reps;10 reps Quad Sets: AAROM;Both;10 reps Short Arc QuadSinclair Ship;Both;5 reps;10 reps Heel Slides: AAROM;Both;5 reps;10 reps Hip ABduction/ADduction: AAROM;Both;10 reps Straight Leg Raises: AAROM;Both;10 reps Other Exercises Other Exercises: Seated core/balance therex with pt only able to maintain independent sitting for 2-3 min before requiring assistance to prevent posterior LOB.   Assessment/Plan    PT Assessment Patient needs continued PT services  PT Problem List Decreased strength;Decreased activity tolerance;Decreased balance;Decreased knowledge of use of DME;Decreased mobility       PT Treatment Interventions DME instruction;Gait training;Functional mobility training;Neuromuscular re-education;Balance training;Therapeutic exercise;Therapeutic activities;Patient/family education    PT Goals (Current goals can be found in the Care Plan section)  Acute Rehab PT Goals PT Goal Formulation: Patient unable to participate in goal setting Time For Goal Achievement: 11/14/17 Potential to Achieve Goals: Good    Frequency Min 2X/week   Barriers to discharge        Co-evaluation               AM-PAC PT "6 Clicks" Daily Activity  Outcome Measure Difficulty turning over in bed (including adjusting bedclothes, sheets and blankets)?: Unable Difficulty moving from lying on back to sitting on the side of the bed? : Unable Difficulty sitting down on and standing up from a chair with arms (e.g., wheelchair, bedside commode, etc,.)?: Unable Help needed moving to and from a bed to chair (including a wheelchair)?: Total Help needed walking in hospital room?: Total Help needed climbing 3-5 steps with a railing? : Total 6 Click Score: 6    End of Session Equipment Utilized During Treatment: Oxygen Activity Tolerance: Patient limited by fatigue Patient left: in bed;with call bell/phone  within reach;with nursing/sitter in room;with bed alarm set Nurse Communication: Mobility status PT Visit Diagnosis: Muscle weakness (generalized) (M62.81);Difficulty in walking, not elsewhere classified (R26.2)    Time: 4627-0350 PT Time Calculation (min) (ACUTE ONLY): 37 min   Charges:   PT Evaluation $PT Eval Moderate Complexity: 1 Mod PT Treatments $Therapeutic Exercise: 8-22 mins   PT G Codes:   PT G-Codes **NOT FOR INPATIENT CLASS** Functional Assessment Tool Used: AM-PAC 6 Clicks Basic Mobility Functional Limitation: Mobility: Walking and moving around Mobility: Walking and Moving Around Current Status (K9381): 100 percent impaired, limited or restricted Mobility: Walking and Moving Around Goal Status (W2993): At least 40 percent but less than 60 percent impaired, limited or restricted    D. Scott Aasim Restivo PT, DPT 11/01/17, 12:02 PM

## 2017-11-01 NOTE — Progress Notes (Signed)
Pt is alert and oriented. Mouths words, gestures, nods yes/no. She is CAM-ICU negative for delerium. She mouths that she is "hungry". It was reported that pt has coughed out previous  Dobhoff tubes. Pt adamant she does not want a DHT replaced. Explained to her the other option was a feeding tube placed into stomach surgically in OR. She does not want that either. Explained risk of aspiration far to great to have food by mouth with her fresh tracheostomy and tachypnea.. She nods she understands the risk. I will have CCM doctor talk to her again about options. D5W at 61mlhr.

## 2017-11-01 NOTE — Evaluation (Signed)
Clinical/Bedside Swallow Evaluation Patient Details  Name: Julia Crawford MRN: [id number] Date of Birth: [birth year]  Today's Date: 11/01/2017 Time: SLP Start Time (ACUTE ONLY): 1350 SLP Stop Time (ACUTE ONLY): 1418 SLP Time Calculation (min) (ACUTE ONLY): 28 min  Past Medical History:  Past Medical History:  Diagnosis Date  . Arthritis   . Asthma   . Atrial fibrillation ([place]) 04/14/2015  . COPD (chronic obstructive pulmonary disease) ([place])   . Coronary artery disease   . Diabetes mellitus without complication ([place])   . Emphysema of lung ([place])   . GERD (gastroesophageal reflux disease)   . Hypothyroid   . Irregular heart beat   . Myasthenia gravis [facility])    Past Surgical History:  Past Surgical History:  Procedure Laterality Date  . BREAST BIOPSY    . CATARACT EXTRACTION    . PARTIAL HYSTERECTOMY    . TONSILLECTOMY     HPI:  Pt is an 82 yo F never smoker with history of DM II, hypothyroidism, myasthenia and asthma admitted via ED with 2-3 days of increasing SOB and diagnosed with acute on chronic hypercarbic respiratory failure due to asthma exacerbation. Initially required BiPAP in ED. Required intubation 10/21/17 for persistent bronchospasm, hypercarbia despite BiPAP.   Pt extubated 10/28/17 and tolerated well initially but developed progressive stridor and was re-intubated.  Pt noted to have severe subglottic stenosis upon re-intubation and trach tube placement performed 10/29/17.  Pt had NG tube, however coughed up NG tube and refusing PEG.  She would like to initiate PO.   Assessment / Plan / Recommendation Clinical Impression  Pt seen for clinical swallow evaluation after refusal of enteral nutrition.  See HPI for details.  Pt tolerating cuff deflation at this time, however still with oxygen needs.  SLP finger occluded trach for voicing and coughing trials, however minimal voice noted and pt unable to produce strong cough.  x5 trials completed this date.  No drop in O2 observed  with finger occlusion for 30 second increments.  Pt not yet appropriate for PSMV trials at this time.  PO trials of ice chips x4 and applesauce x2 completed (1 with crushed meds).  No overt s/sx of aspiration observed, however pt may not be fully sensate and remains at high risk of silent aspiration given other risk factors including prolonged intubation, respiratory status, hx of MG, and prolonged NPO status.  Pt is alert and able to communicate wants and needs via mouthing.  Recommend NPO at this time with ice chips PRN after thorough oral care.  Necessary meds only may be crushed in applesauce with strict adherence to aspiration precautions. Discussed risk of aspiration with pt and she reported understanding to such.  SLP will follow for PO readiness and further PSMV trials.  SLP Visit Diagnosis: Dysphagia, unspecified (R13.10)    Aspiration Risk  Severe aspiration risk;Risk for inadequate nutrition/hydration    Diet Recommendation NPO except meds;Ice chips PRN after oral care(Necessary meds ONLY)   Medication Administration: Crushed with puree Supervision: Staff to assist with self feeding Compensations: Minimize environmental distractions;Slow rate;Hard cough after swallow Postural Changes: Seated upright at 90 degrees;Remain upright for at least 30 minutes after po intake    Other  Recommendations Oral Care Recommendations: Oral care prior to ice chip/H20;Oral care QID;Staff/trained caregiver to provide oral care   Follow up Recommendations Inpatient Rehab      Frequency and Duration min 3x week  2 weeks       Prognosis Prognosis for Safe Diet Advancement:  Fair Barriers to Reach Goals: Severity of deficits      Swallow Study   General Date of Onset: 10/20/17 HPI: Pt is an 82 yo F never smoker with history of DM II, hypothyroidism, myasthenia and asthma admitted via ED with 2-3 days of increasing SOB and diagnosed with acute on chronic hypercarbic respiratory failure due to asthma  exacerbation. Initially required BiPAP in ED. Required intubation 10/21/17 for persistent bronchospasm, hypercarbia despite BiPAP.   Pt extubated 10/28/17 and tolerated well initially but developed progressive stridor and was re-intubated.  Pt noted to have severe subglottic stenosis upon re-intubation and trach tube placement performed 10/29/17.  Pt had NG tube, however coughed up NG tube and refusing PEG.  She would like to initiate PO. Type of Study: Bedside Swallow Evaluation Diet Prior to this Study: NPO;NG Tube Respiratory Status: Trach Collar History of Recent Intubation: Yes Length of Intubations (days): 8 days Date extubated: 10/29/17 Behavior/Cognition: Alert;Cooperative Oral Cavity Assessment: Within Functional Limits Oral Care Completed by SLP: Recent completion by staff Oral Cavity - Dentition: Missing dentition Vision: Functional for self-feeding Self-Feeding Abilities: Needs assist Patient Positioning: Upright in bed Baseline Vocal Quality: Aphonic Volitional Cough: Weak Volitional Swallow: Able to elicit    Oral/Motor/Sensory Function Overall Oral Motor/Sensory Function: Generalized oral weakness   Ice Chips Ice chips: Within functional limits   Thin Liquid Thin Liquid: Not tested    Nectar Thick Nectar Thick Liquid: Not tested   Honey Thick Honey Thick Liquid: Not tested   Puree Puree: Within functional limits   Solid   GO   Solid: Not tested        Viona Hosking 11/01/2017,4:19 PM

## 2017-11-01 NOTE — Progress Notes (Signed)
Alert. Oriented. Mouths words and nods yes/no. On 45% trach collar since 12 noon yesterday. Tolerates well. Tachypnea. RR 28-40/min unlabored. Pt denies need for more air. C/o of abdominal pain and cramping. Abdomen soft and obese. Had small smeary stool earlier. Dulcolax suppository given. She feels she needs to have bm for relief. Was seen by SLT for swallow eval. Mestinon n given crushed in small amount apple sauce. See notes by PT and SLT.

## 2017-11-01 NOTE — Progress Notes (Signed)
Patient expressed concern about going to sleep tonight and requested sleep medication. Spoke with [facility] Physician and they recommended to administer 42mcg of Fentanyl. Patient agreed that she would like to try the Fentanyl to help assist her to relax & sleep. See EMAR.

## 2017-11-02 ENCOUNTER — Inpatient Hospital Stay: Payer: Medicare Other

## 2017-11-02 LAB — GLUCOSE, CAPILLARY
Glucose-Capillary: 110 mg/dL — ABNORMAL HIGH (ref 65–99)
Glucose-Capillary: 110 mg/dL — ABNORMAL HIGH (ref 65–99)
Glucose-Capillary: 118 mg/dL — ABNORMAL HIGH (ref 65–99)
Glucose-Capillary: 125 mg/dL — ABNORMAL HIGH (ref 65–99)
Glucose-Capillary: 132 mg/dL — ABNORMAL HIGH (ref 65–99)
Glucose-Capillary: 153 mg/dL — ABNORMAL HIGH (ref 65–99)
Glucose-Capillary: 186 mg/dL — ABNORMAL HIGH (ref 65–99)

## 2017-11-02 MED ORDER — DEXTROSE 5 % IV SOLN
2.0000 g | Freq: Three times a day (TID) | INTRAVENOUS | Status: DC
  Administered 2017-11-03 – 2017-11-04 (×5): 2 g via INTRAVENOUS
  Filled 2017-11-02 (×7): qty 2000

## 2017-11-02 MED ORDER — PYRIDOSTIGMINE BROMIDE 10 MG/2ML IV SOLN
10.0000 mg | Freq: Three times a day (TID) | INTRAVENOUS | Status: DC
  Administered 2017-11-03 (×2): 10 mg via INTRAVENOUS
  Filled 2017-11-02 (×4): qty 2

## 2017-11-02 NOTE — Progress Notes (Signed)
While this nurse was a preforming trach care, pressure injury noted under trach. Dr.Choi, on call ENT paged and notified. Orders given.Night nurse updated. Will continue to assess.

## 2017-11-02 NOTE — Progress Notes (Signed)
Pharmacy Antibiotic Note  Julia Crawford is a 82 y.o. female admitted on 10/20/2017 with malodorous open wound inferior to tracheostomy site.  Pharmacy has been consulted for cefazolin dosing.  Plan: Will start patient on cefazolin 2g IV q8h for possible infected open wound inferior to tracheostomy site.  Height: 5' (152.4 cm) Weight: 214 lb 8.1 oz (97.3 kg) IBW/kg (Calculated) : 45.5  Temp (24hrs), Avg:98.7 F (37.1 C), Min:97.9 F (36.6 C), Max:99.3 F (37.4 C)  Recent Labs  Lab 10/27/17 0415 10/29/17 0554 10/30/17 0508  WBC 6.1 6.6 7.6  CREATININE 0.53 0.48 0.41*    Estimated Creatinine Clearance: 58.6 mL/min (A) (by C-G formula based on SCr of 0.41 mg/dL (L)).    Allergies  Allergen Reactions  . Ace Inhibitors Cough    Other reaction(s): Unknown  . Apixaban     Other reaction(s): Unknown  . Azithromycin     Myasthenia Gravis Patient  . Butylene Glycol Other (See Comments)  . Codeine Itching and Swelling  . Doxycycline   . Lasix [Furosemide] Other (See Comments)    Chest and back pain  . Peanut Butter Flavor     rash?  . Telithromycin     Other reaction(s): Other (See Comments) Myasthenia Gravis Patient   Thank you for allowing pharmacy to be a part of this patient's care.  Tobie Lords, PharmD, BCPS Clinical Pharmacist 11/02/2017

## 2017-11-02 NOTE — Progress Notes (Signed)
Speech Language Pathology Dysphagia Treatment Patient Details Name: Julia Crawford MRN: [id number] DOB: October 21, 1935 Today's Date: 11/02/2017 Time: 7893-8101 SLP Time Calculation (min) (ACUTE ONLY): 24 min  Assessment / Plan / Recommendation Clinical Impression   pt continues to present with a severe dysphagia characterized by an inability to intake solids or liquids at this time. SLP presented x 2 ice chips with pt. Pt able to manage ice chips with prolonged oral holding. Pt with drop in O2 status. Pt is medically fragile this date and on a ventilator connected to trach. Pt is not stable enough for PMSV at this time. SLP will continue to monitor for progression to attempt PMSV. SLP discontinued trials of ice chips or puree due to change in respiratory status. Pt will continued to be monitored and assessed for further trials  Over next 2-3 days.     Diet Recommendation    NPO   SLP Plan Continue with current plan of care   Pertinent Vitals/Pain None noted   Swallowing Goals     General Behavior/Cognition: Alert;Cooperative Patient Positioning: Partially reclined Oral care provided: N/A HPI: Pt is an 82 yo F never smoker with history of DM II, hypothyroidism, myasthenia and asthma admitted via ED with 2-3 days of increasing SOB and diagnosed with acute on chronic hypercarbic respiratory failure due to asthma exacerbation. Initially required BiPAP in ED. Required intubation 10/21/17 for persistent bronchospasm, hypercarbia despite BiPAP.   Pt extubated 10/28/17 and tolerated well initially but developed progressive stridor and was re-intubated.  Pt noted to have severe subglottic stenosis upon re-intubation and trach tube placement performed 10/29/17.  Pt had NG tube, however coughed up NG tube and refusing PEG.  She would like to initiate PO.  Oral Cavity - Oral Hygiene Patient is HIGH RISK: mechanically ventilated: Order set initiated to provide oral care every 4 hours   Dysphagia  Treatment Treatment Methods: Skilled observation;Upgraded PO texture trial;Patient/caregiver education Patient observed directly with PO's: Yes Type of PO's observed: Ice chips Feeding: Needs assist Liquids provided via: Teaspoon Oral Phase Signs & Symptoms: Prolonged mastication Pharyngeal Phase Signs & Symptoms: Suspected delayed swallow initiation Type of cueing: Verbal Amount of cueing: Minimal   GO Functional Limitations: Swallowing Swallow Current Status (B5102): At least 60 percent but less than 80 percent impaired, limited or restricted Swallow Goal Status [phone]): At least 20 percent but less than 40 percent impaired, limited or restricted   [place] 11/02/2017, 10:48 AM

## 2017-11-02 NOTE — Progress Notes (Signed)
OT Cancellation Note  Patient Details Name: LASHICA HANNAY MRN: [id number] DOB: 16-Apr-1935   Cancelled Treatment:    Reason Eval/Treat Not Completed: Other (comment). Order received, chart reviewed. Pt working with SLP upon attempt. Will re-attempt later this morning as pt is available.  Jeni Salles, MPH, MS, OTR/L ascom [phone] 11/02/17, 10:06 AM

## 2017-11-02 NOTE — Plan of Care (Signed)
Patient switched from trach collar to vent  at bedtime. Patient rested well throughout shift. BM during shift.  Patient able to make needs known. No acute distress noted.  Low urine output. Meds taken crushed in applesauce.     No acute distress noted. Will continue to monitor.

## 2017-11-02 NOTE — Progress Notes (Signed)
[place] at [place] NAME: Julia Crawford    MR#:  [id number]  DATE OF BIRTH:  [birth year]  SUBJECTIVE:  CHIEF COMPLAINT:   Chief Complaint  Patient presents with  . Respiratory Distress   Placed back on vent. Increased secretions Tracheostomy placed 11/21 Refused dobhuff  REVIEW OF SYSTEMS:   Review of Systems  Constitutional: Positive for malaise/fatigue. Negative for chills and fever.  HENT: Negative for sore throat.   Eyes: Negative for blurred vision, double vision and pain.  Respiratory: Positive for cough and shortness of breath. Negative for hemoptysis and wheezing.   Cardiovascular: Positive for leg swelling. Negative for chest pain, palpitations and orthopnea.  Gastrointestinal: Negative for abdominal pain, constipation, diarrhea, heartburn, nausea and vomiting.  Genitourinary: Negative for dysuria and hematuria.  Musculoskeletal: Negative for back pain and joint pain.  Skin: Negative for rash.  Neurological: Positive for weakness. Negative for sensory change, speech change, focal weakness and headaches.  Endo/Heme/Allergies: Does not bruise/bleed easily.  Psychiatric/Behavioral: Negative for depression. The patient is not nervous/anxious.      DRUG ALLERGIES:   Allergies  Allergen Reactions  . Ace Inhibitors Cough    Other reaction(s): Unknown  . Apixaban     Other reaction(s): Unknown  . Azithromycin     Myasthenia Gravis Patient  . Butylene Glycol Other (See Comments)  . Codeine Itching and Swelling  . Doxycycline   . Lasix [Furosemide] Other (See Comments)    Chest and back pain  . Peanut Butter Flavor     rash?  . Telithromycin     Other reaction(s): Other (See Comments) Myasthenia Gravis Patient    VITALS:  Blood pressure (!) 107/56, pulse 86, temperature 97.9 F (36.6 C), temperature source Oral, resp. rate 10, height 5' (1.524 m), weight 97.3 kg (214 lb 8.1 oz), SpO2 98 %.  PHYSICAL EXAMINATION:   GENERAL:  82 y.o.-year-old patient lying in the bed.  EYES: Pupils equal, round, reactive to light and accommodation. No scleral icterus. Extraocular muscles intact.  HEENT: Head atraumatic, normocephalic. Oropharynx and nasopharynx clear.  NECK:  Supple, no jugular venous distention. No thyroid enlargement, no tenderness.  LUNGS: Normal breath sounds bilaterally, wheezing, no crepitation. Trach CARDIOVASCULAR: S1, S2 normal. No murmurs, rubs, or gallops.  ABDOMEN: Soft, nontender, nondistended. Bowel sounds present. No organomegaly or mass.  EXTREMITIES: No pedal edema, cyanosis, or clubbing.  NEUROLOGIC: sedated SKIN: No obvious rash, lesion, or ulcer.   Physical Exam LABORATORY PANEL:   CBC Recent Labs  Lab 10/30/17 0508  WBC 7.6  HGB 11.6*  HCT 34.7*  PLT 239   ------------------------------------------------------------------------------------------------------------------  Chemistries  Recent Labs  Lab 10/30/17 0508  NA 137  K 4.4  CL 104  CO2 27  GLUCOSE 107*  BUN 25*  CREATININE 0.41*  CALCIUM 8.4*   ------------------------------------------------------------------------------------------------------------------  Cardiac Enzymes No results for input(s): TROPONINI in the last 168 hours. ------------------------------------------------------------------------------------------------------------------  RADIOLOGY:  Dg Chest Port 1 View  Result Date: 11/02/2017 CLINICAL DATA:  Pulmonary infiltrates. EXAM: PORTABLE CHEST 1 VIEW COMPARISON:  10/29/2017 FINDINGS: Tracheostomy tube overlies the airway. Left jugular catheter terminates near the cavoatrial junction. Enteric tube has been removed. The patient is rotated to the left, with grossly unchanged cardiomediastinal silhouette. There is improved aeration of the right lung base. Mild curvilinear opacity remains in both lung bases. Density in the lateral left lung base is favored to be secondary to rotation and  atelectasis, without a large pleural effusion or pneumothorax identified. IMPRESSION:  Improved aeration of the right lung base. Mild bibasilar atelectasis. Electronically Signed   By: Logan Bores M.D.   On: 11/02/2017 13:58    ASSESSMENT AND PLAN:   Active Problems:   Acute on chronic respiratory failure with hypercapnia (HCC)   Dyspnea  1.  Respiratory failure: Acute on chronic with hypoxia and hypercapnia.     Status asthamaticus, tracheobronchitis Extubated on 10/28/2017, but reintubated due to subglottic stenosis Nebs. Back On full vent support Tracheostomy 11/21 Transfer to LTAC when stable  2.  CAD: Stable; continue aspirin  3.  Atrial fibrillation: Rate controlled; continue diltiazem  4.  CHF: Systolic; chronic 5.  Diabetes mellitus type 2: keep on ISS. 6. Hyperlipidemia: Continue statin therapy 7.  Myasthenia gravis: Stable; continue pyridostigmine. 8.  Depression: Continue sertraline 9.  DVT prophylaxis: Lovenox  NUTRITION-discussed with patient and explained the need for ongoing nutrition.  She agrees for a Dobbhoff to be placed.   CODE STATUS: DNR  TOTAL TIME TAKING CARE OF THIS PATIENT: 25 minutes.   Neita Carp M.D on 11/02/2017   Between 7am to 6pm - Pager - [phone]  After 6pm go to [email or web address] - password [email or web address] [facility]  Office  [phone]  CC: Primary care physician; Jerrol Banana., MD  Note: This dictation was prepared with Dragon dictation along with smaller phrase technology. Any transcriptional errors that result from this process are unintentional.

## 2017-11-02 NOTE — Progress Notes (Signed)
[place] at [place] NAME: Julia Crawford    MR#:  [id number]  DATE OF BIRTH:  1934-12-20  SUBJECTIVE:  CHIEF COMPLAINT:   Chief Complaint  Patient presents with  . Respiratory Distress   Continued on vent  Tracheostomy placed 11/21  REVIEW OF SYSTEMS:   ROS Sedated  DRUG ALLERGIES:   Allergies  Allergen Reactions  . Ace Inhibitors Cough    Other reaction(s): Unknown  . Apixaban     Other reaction(s): Unknown  . Azithromycin     Myasthenia Gravis Patient  . Butylene Glycol Other (See Comments)  . Codeine Itching and Swelling  . Doxycycline   . Lasix [Furosemide] Other (See Comments)    Chest and back pain  . Peanut Butter Flavor     rash?  . Telithromycin     Other reaction(s): Other (See Comments) Myasthenia Gravis Patient    VITALS:  Blood pressure (!) 107/56, pulse 86, temperature 97.9 F (36.6 C), temperature source Oral, resp. rate 10, height 5' (1.524 m), weight 97.3 kg (214 lb 8.1 oz), SpO2 98 %.  PHYSICAL EXAMINATION:  GENERAL:  82 y.o.-year-old patient lying in the bed.  EYES: Pupils equal, round, reactive to light and accommodation. No scleral icterus. Extraocular muscles intact.  HEENT: Head atraumatic, normocephalic. Oropharynx and nasopharynx clear.  NECK:  Supple, no jugular venous distention. No thyroid enlargement, no tenderness.  LUNGS: Normal breath sounds bilaterally, wheezing, no crepitation. Trach , on vent support. CARDIOVASCULAR: S1, S2 normal. No murmurs, rubs, or gallops.  ABDOMEN: Soft, nontender, nondistended. Bowel sounds present. No organomegaly or mass.  EXTREMITIES: No pedal edema, cyanosis, or clubbing.  NEUROLOGIC: sedated SKIN: No obvious rash, lesion, or ulcer.   Physical Exam LABORATORY PANEL:   CBC Recent Labs  Lab 10/30/17 0508  WBC 7.6  HGB 11.6*  HCT 34.7*  PLT 239    ------------------------------------------------------------------------------------------------------------------  Chemistries  Recent Labs  Lab 10/30/17 0508  NA 137  K 4.4  CL 104  CO2 27  GLUCOSE 107*  BUN 25*  CREATININE 0.41*  CALCIUM 8.4*   ------------------------------------------------------------------------------------------------------------------  Cardiac Enzymes No results for input(s): TROPONINI in the last 168 hours. ------------------------------------------------------------------------------------------------------------------  RADIOLOGY:  Dg Chest Port 1 View  Result Date: 11/02/2017 CLINICAL DATA:  Pulmonary infiltrates. EXAM: PORTABLE CHEST 1 VIEW COMPARISON:  10/29/2017 FINDINGS: Tracheostomy tube overlies the airway. Left jugular catheter terminates near the cavoatrial junction. Enteric tube has been removed. The patient is rotated to the left, with grossly unchanged cardiomediastinal silhouette. There is improved aeration of the right lung base. Mild curvilinear opacity remains in both lung bases. Density in the lateral left lung base is favored to be secondary to rotation and atelectasis, without a large pleural effusion or pneumothorax identified. IMPRESSION: Improved aeration of the right lung base. Mild bibasilar atelectasis. Electronically Signed   By: Logan Bores M.D.   On: 11/02/2017 13:58    ASSESSMENT AND PLAN:   Active Problems:   Acute on chronic respiratory failure with hypercapnia (HCC)   Dyspnea  1.  Respiratory failure: Acute on chronic with hypoxia and hypercapnia.     Status asthamaticus, tracheobronchitis Was on BiPAP and had to be intubated. Extubated on 10/28/2017, but reintubated due to subglottic stenosis  IV abx, steroids, Nebs On full vent support Tracheostomy 11/21 Transfer to LTAC when stable  2.  CAD: Stable; continue aspirin  3.  Atrial fibrillation: Rate controlled; continue diltiazem  4.  CHF: Systolic;  chronic 5.  Diabetes  mellitus type 2: keep on ISS. 6. Hyperlipidemia: Continue statin therapy 7.  Myasthenia gravis: Stable; continue pyridostigmine. 8.  Depression: Continue sertraline 9.  DVT prophylaxis: Lovenox  CODE STATUS: DNR  TOTAL TIME TAKING CARE OF THIS PATIENT: 25 minutes.   Neita Carp M.D on 11/02/2017   Between 7am to 6pm - Pager - [phone]  After 6pm go to [email or web address] - password [email or web address] [facility]  Office  [phone]  CC: Primary care physician; Jerrol Banana., MD  Note: This dictation was prepared with Dragon dictation along with smaller phrase technology. Any transcriptional errors that result from this process are unintentional.

## 2017-11-02 NOTE — Progress Notes (Addendum)
[place] at [place] NAME: Julia Crawford    MR#:  [id number]  DATE OF BIRTH:  1935-10-22  SUBJECTIVE:  CHIEF COMPLAINT:   Chief Complaint  Patient presents with  . Respiratory Distress   On Trach collar today Tracheostomy placed 11/21 Refused dobhuff  REVIEW OF SYSTEMS:   Review of Systems  Constitutional: Positive for malaise/fatigue. Negative for chills and fever.  HENT: Negative for sore throat.   Eyes: Negative for blurred vision, double vision and pain.  Respiratory: Positive for cough and shortness of breath. Negative for hemoptysis and wheezing.   Cardiovascular: Positive for leg swelling. Negative for chest pain, palpitations and orthopnea.  Gastrointestinal: Negative for abdominal pain, constipation, diarrhea, heartburn, nausea and vomiting.  Genitourinary: Negative for dysuria and hematuria.  Musculoskeletal: Negative for back pain and joint pain.  Skin: Negative for rash.  Neurological: Positive for weakness. Negative for sensory change, speech change, focal weakness and headaches.  Endo/Heme/Allergies: Does not bruise/bleed easily.  Psychiatric/Behavioral: Negative for depression. The patient is not nervous/anxious.      DRUG ALLERGIES:   Allergies  Allergen Reactions  . Ace Inhibitors Cough    Other reaction(s): Unknown  . Apixaban     Other reaction(s): Unknown  . Azithromycin     Myasthenia Gravis Patient  . Butylene Glycol Other (See Comments)  . Codeine Itching and Swelling  . Doxycycline   . Lasix [Furosemide] Other (See Comments)    Chest and back pain  . Peanut Butter Flavor     rash?  . Telithromycin     Other reaction(s): Other (See Comments) Myasthenia Gravis Patient    VITALS:  Blood pressure (!) 107/56, pulse 86, temperature 97.9 F (36.6 C), temperature source Oral, resp. rate 10, height 5' (1.524 m), weight 97.3 kg (214 lb 8.1 oz), SpO2 98 %.  PHYSICAL EXAMINATION:  GENERAL:  82  y.o.-year-old patient lying in the bed.  EYES: Pupils equal, round, reactive to light and accommodation. No scleral icterus. Extraocular muscles intact.  HEENT: Head atraumatic, normocephalic. Oropharynx and nasopharynx clear.  NECK:  Supple, no jugular venous distention. No thyroid enlargement, no tenderness.  LUNGS: Normal breath sounds bilaterally, wheezing, no crepitation. Trach CARDIOVASCULAR: S1, S2 normal. No murmurs, rubs, or gallops.  ABDOMEN: Soft, nontender, nondistended. Bowel sounds present. No organomegaly or mass.  EXTREMITIES: No pedal edema, cyanosis, or clubbing.  NEUROLOGIC: sedated SKIN: No obvious rash, lesion, or ulcer.   Physical Exam LABORATORY PANEL:   CBC Recent Labs  Lab 10/30/17 0508  WBC 7.6  HGB 11.6*  HCT 34.7*  PLT 239   ------------------------------------------------------------------------------------------------------------------  Chemistries  Recent Labs  Lab 10/30/17 0508  NA 137  K 4.4  CL 104  CO2 27  GLUCOSE 107*  BUN 25*  CREATININE 0.41*  CALCIUM 8.4*   ------------------------------------------------------------------------------------------------------------------  Cardiac Enzymes No results for input(s): TROPONINI in the last 168 hours. ------------------------------------------------------------------------------------------------------------------  RADIOLOGY:  Dg Chest Port 1 View  Result Date: 11/02/2017 CLINICAL DATA:  Pulmonary infiltrates. EXAM: PORTABLE CHEST 1 VIEW COMPARISON:  10/29/2017 FINDINGS: Tracheostomy tube overlies the airway. Left jugular catheter terminates near the cavoatrial junction. Enteric tube has been removed. The patient is rotated to the left, with grossly unchanged cardiomediastinal silhouette. There is improved aeration of the right lung base. Mild curvilinear opacity remains in both lung bases. Density in the lateral left lung base is favored to be secondary to rotation and atelectasis,  without a large pleural effusion or pneumothorax identified. IMPRESSION: Improved aeration  of the right lung base. Mild bibasilar atelectasis. Electronically Signed   By: Logan Bores M.D.   On: 11/02/2017 13:58    ASSESSMENT AND PLAN:   Active Problems:   Acute on chronic respiratory failure with hypercapnia (HCC)   Dyspnea  1.  Respiratory failure: Acute on chronic with hypoxia and hypercapnia.     Status asthamaticus, tracheobronchitis Extubated on 10/28/2017, but reintubated due to subglottic stenosis Nebs On full vent support Tracheostomy 11/21 Transfer to LTAC when stable  2.  CAD: Stable; continue aspirin  3.  Atrial fibrillation: Rate controlled; continue diltiazem  4.  CHF: Systolic; chronic 5.  Diabetes mellitus type 2: keep on ISS. 6. Hyperlipidemia: Continue statin therapy 7.  Myasthenia gravis: Stable; continue pyridostigmine. 8.  Depression: Continue sertraline 9.  DVT prophylaxis: Lovenox  CODE STATUS: DNR  TOTAL TIME TAKING CARE OF THIS PATIENT: 25 minutes.   Neita Carp M.D on 11/02/2017   Between 7am to 6pm - Pager - [phone]  After 6pm go to [email or web address] - password [email or web address] [facility]  Office  [phone]  CC: Primary care physician; Jerrol Banana., MD  Note: This dictation was prepared with Dragon dictation along with smaller phrase technology. Any transcriptional errors that result from this process are unintentional.

## 2017-11-02 NOTE — Therapy (Signed)
Patient became apneic and the ventilator was ventilating with apnea parameters..  Pt was placed back on PRVC at Vt-500 RR-10 PEEP-5 35% O2.

## 2017-11-02 NOTE — Progress Notes (Signed)
[place] Progress Note Patient Name: Julia Crawford DOB: 1935/10/01 MRN: [id number]   Date of Service  11/02/2017  HPI/Events of Note    eICU Interventions  Reviewed case with ENT.  Evaluated area of erosion at trach stoma site.  He recommended cefazolin for possible associated cellulitis.  Orders placed     Intervention Category Major Interventions: Other:  Collene Gobble 11/02/2017, 9:42 PM

## 2017-11-02 NOTE — Progress Notes (Signed)
POD 3 tracheotomy  Called to bedside due to pressure ulcer development under the tracheotomy. She had to return to the OR on POD 0 for control of bleeding from the tracheotomy site.  She currently has a 6-0 cuffed shiley and is ventilating well. Malodorous open wound inferior to the tracheotomy site. Wet to dry dressing with saline and 4x4 gauze was placed. Silk sutures were removed and trach tie was secured. Drain sponge placed.  Recommend BID wet to dry dressing changes to the tracheotomy site and course of ancef until clinical improvement in wound bed noted.  Vondell Babers, Parke Simmers, MD

## 2017-11-02 NOTE — Progress Notes (Signed)
PULMONARY / CRITICAL CARE MEDICINE   Name: JANIKA JEDLICKA MRN: [id number] DOB: January 07, 1935    ADMISSION DATE:  10/20/2017  PT PROFILE:   51 F never smoker with history of DM II, hypothyroidism, myasthenia and asthma admitted via ED with 2-3 days of increasing SOB. Initially required BiPAP in ED. Required intubation 11/12 for persistent bronchospasm, hypercarbia despite BiPAP  MAJOR EVENTS/TEST RESULTS: 11/11 Admitted with dx of acute/chronic hypercarbic respiratory failure due to asthma exacerbation 11/12 intubated 11/13 very tight bronchospasm with very prolonged expiratory time and very high PIPs with pt-vent dyssynchrony requiring NMB.  11/13 developed AF with very slow vent rate. Made DNR in event of cardiac arrest 11/14 Severe but improved bronchospasm. Able to F/C.  11/15 persistent severe bronchospasm.  Tolerated PSV mode only briefly.  Cognition intact on WUA.  Increased airway secretions 11/18 failed SBT 11/19 Passed SBT and extubated. Tolerated well initially but developed progressive stridor throughout day and re-intubated. Noted to have severe subglottic stenosis upon re-intubation 11/20 Family agreed to proceed with trach tube placement. ENT consulted 11/21 Tracheostomy tube placement 11/22 no acute events overnight. 11/24. Patient placed back on ventilator overnight. Thick tan secretions on suction.  INDWELLING DEVICES:: ETT 11/12 >> 11/19, 11/19 >> 11/21 Trach tube 11/21 >>  L IJ CVL 11/12 >>   MICRO DATA: MRSA PCR 11/11 >> NEG Resp 11/12 >> NOF Blood 11/11 >> NEG Resp 11/15 >> Klebsiella pneumoniae  ANTIMICROBIALS:  Ceftriaxone 11/15 >> 11/21    SUBJECTIVE:  Patient opens eyes and follows commands.  Does not appear in acute distress.  VITAL SIGNS: BP (!) 107/56   Pulse 86   Temp 98.4 F (36.9 C) (Oral)   Resp 10   Ht 5' (1.524 m)   Wt 214 lb 8.1 oz (97.3 kg)   SpO2 98%   BMI 41.89 kg/m   HEMODYNAMICS:    VENTILATOR SETTINGS: Vent Mode:  PSV;CPAP FiO2 (%):  [35 %-45 %] 35 % PEEP:  [5 cmH20] 5 cmH20 Pressure Support:  [15 cmH20-20 cmH20] 20 cmH20  INTAKE / OUTPUT: I/O last 3 completed shifts: In: 792 [I.V.:742; IV Piggyback:50] Out: 4098 [Urine:1530]  PHYSICAL EXAMINATION: General: RASS 0.  + F/C Neuro: CNs intact, MAEs HEENT: NCAT, sclerae white, trach+ Cardiovascular: reg, no M Lungs: minimal rhonchi b/l + Abdomen: obese, soft, diminished BS Ext: warm, no edema Skin: no lesions noted  LABS:  BMET Recent Labs  Lab 10/27/17 0415 10/29/17 0554 10/30/17 0508  NA 137 139 137  K 4.3 4.4 4.4  CL 99* 102 104  CO2 30 28 27   BUN 29* 31* 25*  CREATININE 0.53 0.48 0.41*  GLUCOSE 219* 75 107*    Electrolytes Recent Labs  Lab 10/27/17 0415 10/29/17 0554 10/30/17 0508  CALCIUM 8.4* 8.6* 8.4*    CBC Recent Labs  Lab 10/27/17 0415 10/29/17 0554 10/30/17 0508  WBC 6.1 6.6 7.6  HGB 11.9* 12.0 11.6*  HCT 35.8 34.7* 34.7*  PLT 196 222 239    Coag's No results for input(s): APTT, INR in the last 168 hours.  Sepsis Markers No results for input(s): LATICACIDVEN, PROCALCITON, O2SATVEN in the last 168 hours.  ABG Recent Labs  Lab 10/27/17 0500  PHART 7.46*  PCO2ART 44  PO2ART 93    Liver Enzymes No results for input(s): AST, ALT, ALKPHOS, BILITOT, ALBUMIN in the last 168 hours.  Cardiac Enzymes No results for input(s): TROPONINI, PROBNP in the last 168 hours.  Glucose Recent Labs  Lab 11/01/17 1859 11/01/17 2018 11/01/17  2357 11/02/17 0407 11/02/17 0737 11/02/17 1210  GLUCAP 125* 142* 125* 110* 110* 118*    CXR: No new film   ASSESSMENT / PLAN:  PULMONARY A: Acute/chronic respiratory failure Status asthmaticus Failed extubation due to stridor Subglottic stenosis Status post tracheostomy P:   On PSV 20/5. Will wean as tolerated. Check sputum culture. Will hold off on antibiotics at this time. CxR.  CARDIOVASCULAR A:  PAF with RVR - rate presently controlled P:   Monitor per ICU protocol Rate control meds.  Will resume anticoagulation.   RENAL A:   No issues P:   Monitor BMET intermittently Monitor I/Os Correct electrolytes as indicated   GASTROINTESTINAL A:   Obesity P:   SUP: IV famotidine  Resume TF's after return from  HEMATOLOGIC A:   Macrocytic anemia - normal serum vitamin B12, RBC folate P:  DVT px: SCDs (enox on hold) Monitor CBC intermittently Transfuse per usual guidelines   INFECTIOUS A:   Klebsiella tracheobronchitis P:   Monitor temp, WBC count Micro and abx as above  Completed 7 days ceftriaxone  ENDOCRINE A:   Type II DM - controlled P:   Continue SSI -changed to moderate scale 11/13 Continue Lantus-initiated 11/14  NEUROLOGIC A:   ICU/vent associated discomfort Myasthenia gravis Deconditioning P:   RASS goal: 0 Continue PAD protocol - propofol, PRN fentanyl Continue pyridostigmine PT evaluation ordered Advance activity as able Discharge planning -might need LTAC  FAMILY: No family at bedside this a.m.  CCM time: 32 mins The above time includes time spent in consultation with patient and/or family members and reviewing care plan on multidisciplinary rounds  Dimas Chyle MD PCCM

## 2017-11-02 NOTE — Progress Notes (Signed)
OT Cancellation Note  Patient Details Name: Julia Crawford MRN: [id number] DOB: December 13, 1934   Cancelled Treatment:    Reason Eval/Treat Not Completed: Medical issues which prohibited therapy. On second attempt, pt with resting HR 110-123. Not appropriate for therapy at this time. RN notified. Will re-attempt at later date/time as pt is medically appropriate.   Jeni Salles, MPH, MS, OTR/L ascom [phone] 11/02/17, 11:31 AM

## 2017-11-03 DIAGNOSIS — L8990 Pressure ulcer of unspecified site, unspecified stage: Secondary | ICD-10-CM

## 2017-11-03 LAB — GLUCOSE, CAPILLARY
Glucose-Capillary: 143 mg/dL — ABNORMAL HIGH (ref 65–99)
Glucose-Capillary: 114 mg/dL — ABNORMAL HIGH (ref 65–99)
Glucose-Capillary: 122 mg/dL — ABNORMAL HIGH (ref 65–99)
Glucose-Capillary: 170 mg/dL — ABNORMAL HIGH (ref 65–99)
Glucose-Capillary: 155 mg/dL — ABNORMAL HIGH (ref 65–99)

## 2017-11-03 LAB — CBC
HEMATOCRIT: 30.2 % — AB (ref 35.0–47.0)
HEMOGLOBIN: 10.3 g/dL — AB (ref 12.0–16.0)
MCH: 35.8 pg — AB (ref 26.0–34.0)
MCHC: 34.1 g/dL (ref 32.0–36.0)
MCV: 104.9 fL — AB (ref 80.0–100.0)
Platelets: 252 10*3/uL (ref 150–440)
RBC: 2.88 MIL/uL — AB (ref 3.80–5.20)
RDW: 16 % — ABNORMAL HIGH (ref 11.5–14.5)
WBC: 5.9 10*3/uL (ref 3.6–11.0)

## 2017-11-03 LAB — CREATININE, SERUM
Creatinine, Ser: 0.39 mg/dL — ABNORMAL LOW (ref 0.44–1.00)
GFR calc Af Amer: >60 mL/min (ref >60)

## 2017-11-03 MED ORDER — PYRIDOSTIGMINE BROMIDE 10 MG/2ML IV SOLN
10.0000 mg | Freq: Three times a day (TID) | INTRAVENOUS | Status: DC
  Filled 2017-11-03: qty 2

## 2017-11-03 MED ORDER — PYRIDOSTIGMINE BROMIDE 10 MG/2ML IV SOLN
2.0000 mg | Freq: Three times a day (TID) | INTRAVENOUS | Status: DC
  Administered 2017-11-03: 2 mg via INTRAVENOUS
  Filled 2017-11-03 (×3): qty 0.4

## 2017-11-03 MED ORDER — PYRIDOSTIGMINE BROMIDE 10 MG/2ML IV SOLN
2.0000 mg | Freq: Three times a day (TID) | INTRAVENOUS | Status: DC
  Administered 2017-11-03 – 2017-11-05 (×5): 2 mg via INTRAVENOUS
  Filled 2017-11-03 (×6): qty 0.4

## 2017-11-03 NOTE — Progress Notes (Signed)
[facility] [place] NAME: Julia Crawford    MR#:  [id number]  DATE OF BIRTH:  03/29/1935  SUBJECTIVE:  CHIEF COMPLAINT:   Chief Complaint  Patient presents with  . Respiratory Distress  alert and nodes her head for questions. She thanked for care REVIEW OF SYSTEMS:   Review of Systems  Constitutional: Positive for malaise/fatigue. Negative for chills and fever.  HENT: Negative for sore throat.   Eyes: Negative for blurred vision, double vision and pain.  Respiratory: Positive for cough and shortness of breath. Negative for hemoptysis and wheezing.   Cardiovascular: Positive for leg swelling. Negative for chest pain, palpitations and orthopnea.  Gastrointestinal: Negative for abdominal pain, constipation, diarrhea, heartburn, nausea and vomiting.  Genitourinary: Negative for dysuria and hematuria.  Musculoskeletal: Negative for back pain and joint pain.  Skin: Negative for rash.  Neurological: Positive for weakness. Negative for sensory change, speech change, focal weakness and headaches.  Endo/Heme/Allergies: Does not bruise/bleed easily.  Psychiatric/Behavioral: Negative for depression. The patient is not nervous/anxious.    DRUG ALLERGIES:   Allergies  Allergen Reactions  . Ace Inhibitors Cough    Other reaction(s): Unknown  . Apixaban     Other reaction(s): Unknown  . Azithromycin     Myasthenia Gravis Patient  . Butylene Glycol Other (See Comments)  . Codeine Itching and Swelling  . Doxycycline   . Lasix [Furosemide] Other (See Comments)    Chest and back pain  . Peanut Butter Flavor     rash?  . Telithromycin     Other reaction(s): Other (See Comments) Myasthenia Gravis Patient    VITALS:  Blood pressure 114/64, pulse 96, temperature 98.5 F (36.9 C), temperature source Oral, resp. rate 12, height 5' (1.524 m), weight 97.3 kg (214 lb 8.1 oz), SpO2 99 %.  PHYSICAL EXAMINATION:  GENERAL:  82 y.o.-year-old patient lying  in the bed.  EYES: Pupils equal, round, reactive to light and accommodation. No scleral icterus. Extraocular muscles intact.  HEENT: Head atraumatic, normocephalic. Oropharynx and nasopharynx clear. Trach in place, dressing intact NECK:  Supple, no jugular venous distention. No thyroid enlargement, no tenderness.  LUNGS: Normal breath sounds bilaterally, wheezing, no crepitation. Trach CARDIOVASCULAR: S1, S2 normal. No murmurs, rubs, or gallops.  ABDOMEN: Soft, nontender, nondistended. Bowel sounds present. No organomegaly or mass.  EXTREMITIES: No pedal edema, cyanosis, or clubbing.  NEUROLOGIC: sedated SKIN: No obvious rash, lesion, or ulcer.   Physical Exam LABORATORY PANEL:   CBC Recent Labs  Lab 11/03/17 0439  WBC 5.9  HGB 10.3*  HCT 30.2*  PLT 252   ------------------------------------------------------------------------------------------------------------------  Chemistries  Recent Labs  Lab 10/30/17 0508 11/03/17 0439  NA 137  --   K 4.4  --   CL 104  --   CO2 27  --   GLUCOSE 107*  --   BUN 25*  --   CREATININE 0.41* 0.39*  CALCIUM 8.4*  --    ------------------------------------------------------------------------------------------------------------------  Cardiac Enzymes No results for input(s): TROPONINI in the last 168 hours. ------------------------------------------------------------------------------------------------------------------  RADIOLOGY:  Dg Chest Port 1 View  Result Date: 11/02/2017 CLINICAL DATA:  Pulmonary infiltrates. EXAM: PORTABLE CHEST 1 VIEW COMPARISON:  10/29/2017 FINDINGS: Tracheostomy tube overlies the airway. Left jugular catheter terminates near the cavoatrial junction. Enteric tube has been removed. The patient is rotated to the left, with grossly unchanged cardiomediastinal silhouette. There is improved aeration of the right lung base. Mild curvilinear opacity remains in both lung bases. Density in the  lateral left lung base is  favored to be secondary to rotation and atelectasis, without a large pleural effusion or pneumothorax identified. IMPRESSION: Improved aeration of the right lung base. Mild bibasilar atelectasis. Electronically Signed   By: Logan Bores M.D.   On: 11/02/2017 13:58    ASSESSMENT AND PLAN:   Active Problems:   Acute on chronic respiratory failure with hypercapnia (HCC)   Dyspnea   Pressure injury of skin  1.  Respiratory failure: Acute on chronic with hypoxia and hypercapnia.     Status asthamaticus, tracheobronchitis Extubated on 10/28/2017, but reintubated due to subglottic stenosis Nebs. On full vent support Tracheostomy 11/21, dressing changed by ENT and cleaned the area as there's concern for infection Transfer to LTAC when stable  2.  CAD: Stable; continue aspirin  3.  Atrial fibrillation: Rate controlled; continue diltiazem  4.  CHF: Systolic; chronic 5.  Diabetes mellitus type 2: keep on ISS. 6. Hyperlipidemia: Continue statin therapy 7.  Myasthenia gravis: Stable; continue pyridostigmine. Dose adjusted after d/w pharmacy 8.  Depression: Continue sertraline 9.  DVT prophylaxis: Lovenox     CODE STATUS: DNR  TOTAL TIME TAKING CARE OF THIS PATIENT: 25 minutes.   Max Sane M.D on 11/03/2017   Between 7am to 6pm - Pager - [phone]  After 6pm go to [email or web address] - password [email or web address] [facility]  Office  [phone]  CC: Primary care physician; Jerrol Banana., MD  Note: This dictation was prepared with Dragon dictation along with smaller phrase technology. Any transcriptional errors that result from this process are unintentional.

## 2017-11-03 NOTE — Plan of Care (Signed)
Dr. Maylene Roes in assess trach.  Area cleaned, and packed with gauze wet to dry.  Stitches removed. Orders to change dressing  BID.  Patient tolerated well. Patient bathed. Small bowel movement. Tolerating vent well.  Patient has new orders for Ancef.  Increased secretions.  Malodorous secretion.  No acute concerns. Will continue to monitor.

## 2017-11-04 ENCOUNTER — Encounter: Payer: Self-pay | Admitting: Otolaryngology

## 2017-11-04 ENCOUNTER — Inpatient Hospital Stay: Payer: Medicare Other

## 2017-11-04 LAB — GLUCOSE, CAPILLARY
Glucose-Capillary: 110 mg/dL — ABNORMAL HIGH (ref 65–99)
Glucose-Capillary: 129 mg/dL — ABNORMAL HIGH (ref 65–99)
Glucose-Capillary: 130 mg/dL — ABNORMAL HIGH (ref 65–99)
Glucose-Capillary: 146 mg/dL — ABNORMAL HIGH (ref 65–99)
Glucose-Capillary: 149 mg/dL — ABNORMAL HIGH (ref 65–99)
Glucose-Capillary: 96 mg/dL (ref 65–99)
Glucose-Capillary: 98 mg/dL (ref 65–99)
Glucose-Capillary: 99 mg/dL (ref 65–99)

## 2017-11-04 MED ORDER — VITAL AF 1.2 CAL PO LIQD
1000.0000 mL | ORAL | Status: DC
  Administered 2017-11-04 – 2017-11-08 (×5): 1000 mL

## 2017-11-04 MED ORDER — LEVOTHYROXINE SODIUM 100 MCG IV SOLR
50.0000 ug | Freq: Every day | INTRAVENOUS | Status: DC
  Administered 2017-11-04 – 2017-11-06 (×3): 50 ug via INTRAVENOUS
  Filled 2017-11-04 (×5): qty 5

## 2017-11-04 MED ORDER — JEVITY 1.2 CAL PO LIQD: 1000.0000 mL | ORAL | Status: DC

## 2017-11-04 MED ORDER — CEFTRIAXONE SODIUM 1 G IJ SOLR
1.0000 g | INTRAMUSCULAR | Status: DC
  Administered 2017-11-04 – 2017-11-06 (×3): 1 g via INTRAVENOUS
  Filled 2017-11-04 (×4): qty 10

## 2017-11-04 MED ORDER — STERILE WATER FOR INJECTION IJ SOLN
INTRAMUSCULAR | Status: AC
  Administered 2017-11-04: 10 mL
  Filled 2017-11-04: qty 10

## 2017-11-04 MED ORDER — MIDAZOLAM HCL 2 MG/2ML IJ SOLN
4.0000 mg | Freq: Once | INTRAMUSCULAR | Status: AC
  Administered 2017-11-04: 4 mg via INTRAVENOUS
  Filled 2017-11-04: qty 4

## 2017-11-04 NOTE — Progress Notes (Signed)
PULMONARY / CRITICAL CARE MEDICINE   Name: MISK GALENTINE MRN: [id number] DOB: [birth year]    ADMISSION DATE:  10/20/2017  PT PROFILE:   89 F never smoker with history of DM II, hypothyroidism, myasthenia and asthma admitted via ED with 2-3 days of increasing SOB. Initially required BiPAP in ED. Required intubation 11/12 for persistent bronchospasm, hypercarbia despite BiPAP  MAJOR EVENTS/TEST RESULTS: 11/11 Admitted with dx of acute/chronic hypercarbic respiratory failure due to asthma exacerbation 11/12 intubated 11/13 very tight bronchospasm with very prolonged expiratory time and very high PIPs with pt-vent dyssynchrony requiring NMB.  11/13 developed AF with very slow vent rate. Made DNR in event of cardiac arrest 11/14 Severe but improved bronchospasm. Able to F/C.  11/15 persistent severe bronchospasm.  Tolerated PSV mode only briefly.  Cognition intact on WUA.  Increased airway secretions 11/18 failed SBT 11/19 Passed SBT and extubated. Tolerated well initially but developed progressive stridor throughout day and re-intubated. Noted to have severe subglottic stenosis upon re-intubation 11/20 Family agreed to proceed with trach tube placement. ENT consulted 11/21 Tracheostomy tube placement 11/22 no acute events overnight. 11/24. Patient placed back on ventilator overnight. Thick tan secretions on suction. 11/25 On PRVC overnight  INDWELLING DEVICES:: ETT 11/12 >> 11/19, 11/19 >> 11/21 Trach tube 11/21 >>  L IJ CVL 11/12 >>   MICRO DATA: MRSA PCR 11/11 >> NEG Resp 11/12 >> NOF Blood 11/11 >> NEG Resp 11/15 >> Klebsiella pneumoniae  ANTIMICROBIALS:  Ceftriaxone 11/15 >> 11/21    SUBJECTIVE:  Patient opens eyes and follows commands.  Does not appear in acute distress. Thick secretions from [place]: BP (!) 150/104 (BP Location: Left Arm)   Pulse 75   Temp 99.4 F (37.4 C) (Oral)   Resp (!) 26   Ht 5' (1.524 m)   Wt 214 lb 8.1 oz (97.3 kg)   SpO2 100%    BMI 41.89 kg/m   HEMODYNAMICS:    VENTILATOR SETTINGS: Vent Mode: PRVC FiO2 (%):  [35 %] 35 % Set Rate:  [10 bmp] 10 bmp Vt Set:  [500 mL] 500 mL PEEP:  [5 cmH20] 5 cmH20 Plateau Pressure:  [18 cmH20] 18 cmH20  INTAKE / OUTPUT: I/O last 3 completed shifts: In: 1570 [I.V.:1170; IV Piggyback:400] Out: 610 [Urine:610]  PHYSICAL EXAMINATION: General: RASS 0.  + F/C Neuro: CNs intact, MAEs HEENT: NCAT, sclerae white, trach+ Cardiovascular: reg, no M Lungs: rhonchi b/l + Abdomen: obese, soft, diminished BS Ext: warm, no edema Skin: no lesions noted  LABS:  BMET Recent Labs  Lab 10/29/17 0554 10/30/17 0508 11/03/17 0439  NA 139 137  --   K 4.4 4.4  --   CL 102 104  --   CO2 28 27  --   BUN 31* 25*  --   CREATININE 0.48 0.41* 0.39*  GLUCOSE 75 107*  --     Electrolytes Recent Labs  Lab 10/29/17 0554 10/30/17 0508  CALCIUM 8.6* 8.4*    CBC Recent Labs  Lab 10/29/17 0554 10/30/17 0508 11/03/17 0439  WBC 6.6 7.6 5.9  HGB 12.0 11.6* 10.3*  HCT 34.7* 34.7* 30.2*  PLT 222 239 252    Coag's No results for input(s): APTT, INR in the last 168 hours.  Sepsis Markers No results for input(s): LATICACIDVEN, PROCALCITON, O2SATVEN in the last 168 hours.  ABG No results for input(s): PHART, PCO2ART, PO2ART in the last 168 hours.  Liver Enzymes No results for input(s): AST, ALT, ALKPHOS, BILITOT, ALBUMIN in the  last 168 hours.  Cardiac Enzymes No results for input(s): TROPONINI, PROBNP in the last 168 hours.  Glucose Recent Labs  Lab 11/03/17 0731 11/03/17 1142 11/03/17 1616 11/03/17 1937 11/04/17 0038 11/04/17 0349  GLUCAP 114* 122* 170* 155* 146* 96    CXR: No new film   ASSESSMENT / PLAN:  PULMONARY A: Acute/chronic respiratory failure Status asthmaticus Failed extubation due to stridor Subglottic stenosis Status post tracheostomy P:   On PRVC. Will wean as tolerated. Continue to hold off on antibiotics at this time. Trach aspirate  culture pending  CARDIOVASCULAR A:  PAF with RVR - rate presently controlled P:  Monitor per ICU protocol Rate control meds.   RENAL A:   No issues P:   Monitor BMET intermittently Monitor I/Os Correct electrolytes as indicated   GASTROINTESTINAL A:   Obesity P:   SUP: IV famotidine  Resume TF's after return from  HEMATOLOGIC A:   Macrocytic anemia - normal serum vitamin B12, RBC folate P:  DVT px: SCDs (enox on hold) Monitor CBC intermittently Transfuse per usual guidelines   INFECTIOUS A:   Klebsiella tracheobronchitis P:   Monitor temp, WBC count Micro and abx as above  Completed 7 days ceftriaxone  ENDOCRINE A:   Type II DM - controlled P:   Continue SSI -changed to moderate scale 11/13 Continue Lantus-initiated 11/14  NEUROLOGIC A:   ICU/vent associated discomfort Myasthenia gravis Deconditioning P:   RASS goal: 0 Continue PAD protocol - propofol, PRN fentanyl Continue pyridostigmine PT evaluation ordered Advance activity as able Discharge planning -might need LTAC  FAMILY: No family at bedside this a.m.  CCM time: 32 mins The above time includes time spent in consultation with patient and/or family members and reviewing care plan on multidisciplinary rounds  Dimas Chyle MD PCCM

## 2017-11-04 NOTE — Care Management (Signed)
Spoke with Hotel manager. She plans to start auth with insurance tomorrow (day 7 trach).

## 2017-11-04 NOTE — Consult Note (Signed)
[facility] Nurse wound consult note Reason for Consult: Full thickness device related pressure injury to trach site.   Wound type:pressure Pressure Injury POA: No Measurement: 2 cm x 2 cm x 0.3 cm  Wound bed: pale pink Drainage (amount, consistency, odor) minimal serosanguinous  Musty odor Periwound:intact Dressing procedure/placement/frequency:Keep trach site clean and dry.  Please cleanse wound to trach site with NS and gently fill wound depth with calcium alginate dressing.  Cover with drain sponge.  Change twice daily.  Will not follow at this time.  Please re-consult if needed.  Domenic Moras RN BSN [place] Pager [phone]

## 2017-11-04 NOTE — Progress Notes (Signed)
Placed Ng tube per verbal orders. Central line removed per Dr. Mariel Kansky orders and trach care preformed. Pt tolerated well. Will continue to assess.

## 2017-11-04 NOTE — Progress Notes (Signed)
Nutrition Follow-up  DOCUMENTATION CODES:   Obesity unspecified  INTERVENTION:  Plan is for placement of 10 French NGT with weighted Dobbhoff tip today.  Once tube placed and confirmed recommend initiating Vital AF 1.2 at 65 mL/hr (1560 mL goal daily volume). Provides 1872 kcal, 117 grams of protein, 1264 mL H2O daily.  As patient is on D5W at 30 mL/hr, recommend minimum free water flush of 30 mL Q4hrs to maintain tube patency.  Once patient is more stable and able to stay on trach collar, can consider transitioning to overnight feeds or bolus feeding regimen. However, as patient is still requiring time on ventilator recommend providing 24 hour continuous feed at this time.  NUTRITION DIAGNOSIS:   Inadequate oral intake related to inability to eat as evidenced by NPO status.  Ongoing.  GOAL:   Provide needs based on ASPEN/SCCM guidelines  Not met. Addressing with re-initiation of tube feeds today.  MONITOR:   Vent status, Labs, Weight trends, TF tolerance, I & O's  REASON FOR ASSESSMENT:   Ventilator, Consult Enteral/tube feeding initiation and management  ASSESSMENT:   82 year old female with PMHx of CAD, COPD, arthritis, CHF, DM type 2, myasthenia gravis, hypothyroidism, A-fib, GERD, emphysema of lung, hx of partial hysterectomy who presented with shortness of breath who presented with acute on chronic respiratory failure with hypoxia and hypercapnia and required emergent intubation on 11/12 after failing BiPAP.  -On 11/21 after 2.5 hours on trach collar patient was placed back on vent for increased WOB. Went back on trach collar AM of 11/22. Switched back to vent at bedtime on 11/24. -SLP following patient. Unable to get Passy Muir valve at this time due to secretions. Also will not be safe for PO intake at this time. -Patient coughed up NGT and did not want it replaced over the weekend. It appears tube feeds have been off since 11/22 or 11/23, but difficult to tell per  chart. -Patient now has full-thickness device related pressure injury to trach site (2cm x 2cm x 0.3cm) with minimal serosanguinous drainage that has musty odor.  Met with patient at bedside. She is now back on ventilator. She is not on any sedation. She is able to communicate by mouthing words and also has a white board for communication. Discussed with patient need for nutrition so she can stay strong for therapy and heal wound on tracheostomy. Also discussed that per SLP assessment this morning it will be a while before she is safe to swallow, so we need another way to feed her. She is amenable to getting another NGT placed for nutrition. Per discussion in rounds plan is for some sedation during placement.  Access: plan is for placement of 10 French NGT with Dobbhoff weighted tip today  MAP: 92-122 mmHg  Patient is currently intubated on ventilator support MV: 9.1 L/min Temp (24hrs), Avg:99 F (37.2 C), Min:98.2 F (36.8 C), Max:99.4 F (37.4 C)  Medications reviewed and include: Novolog 0-15 units Q4hrs, levothyroxine, Solu-Medrol 20 mg daily IV, Senokot, ceftriaxone, D5W at 30 mL/hr (36 grams dextrose, 122 kcal daily), famotidine IV.  Labs reviewed: CBG 96-170 past 24 hrs. Creatinine 0.39 on 11/25.  I/O: 2 occurrences unmeasured UOP overnight; one BM overnight  Weight trend: 93.4 kg on 11/26; -2.5 kg since 11/11  Discussed with RN.  Diet Order:  No diet orders on file  EDUCATION NEEDS:   No education needs have been identified at this time  Skin:  Skin Assessment: Skin Integrity Issues: Skin Integrity  Issues:: Stage III Stage III: full-thickness device related pressure injury to trach site (2cm x 2cm x 0.3cm)  Last BM:  11/04/2017 - small type 7  Height:   Ht Readings from Last 1 Encounters:  10/20/17 5' (1.524 m)    Weight:   Wt Readings from Last 1 Encounters:  11/04/17 205 lb 14.6 oz (93.4 kg)    Ideal Body Weight:  45.5 kg  BMI:  Body mass index is 40.21  kg/m.  Estimated Nutritional Needs:   Kcal:  1700-1960 (MSJ x 1.3-1.5)  Protein:  110-120 grams (1.1-1.3 grams/kg; approximately 2.4-2.6 grams/kg IBW)  Fluid:  1.4-1.6 L/day (30-35 mL/kg IBW)  Willey Blade, MS, RD, LDN Office: [phone] Pager: [phone] After Hours/Weekend Pager: [phone]

## 2017-11-04 NOTE — Plan of Care (Signed)
Patient on trach/vent. Dressing to stoma completed.  Malodorous.  Patient declines pain medication before treatment.  Bladder scan revealed 300 mls.  Patient voided x 2, unmeasured.  Medium BM. Purewick remains in place.  Makes needs known.  Will continue to monitor.

## 2017-11-04 NOTE — Progress Notes (Signed)
Pt ransfered to reclyner with four person assist. Pt tolerated fair, HR 106 BP 141/110 RR 22 O2 96. Pt remains alert and oriented. Pt declines pain at this time.

## 2017-11-04 NOTE — Evaluation (Signed)
Occupational Therapy Evaluation Patient Details Name: VICTORYA HILLMAN MRN: [id number] DOB: [birth year] Today's Date: 11/04/2017    History of Present Illness Pt is an 82 yo F never smoker with history of DM II, hypothyroidism, myasthenia and asthma admitted via ED with 2-3 days of increasing SOB and diagnosed with acute on chronic hypercarbic respiratory failure due to asthma exacerbation. Initially required BiPAP in ED. Required intubation 10/21/17 for persistent bronchospasm, hypercarbia despite BiPAP.   Pt extubated 10/28/17 and tolerated well initially but developed progressive stridor and was re-intubated.  Pt noted to have severe subglottic stenosis upon re-intubation and trach tube placement performed 10/29/17.   Clinical Impression   Pt seen for OT evaluation this date. Pt eager to work with therapist and attempt to get out of bed this date. Prior to admission, pt was living in an ALF, modified independent with rollator for mobility, independent with toileting, dressing, grooming, and self feeding, requiring assist from her daughter for bathing and medication mgt. Currently, pt presents with generalized weakness BUE 3+/5, BLE at least 3/5, trach/vent in place, impaired balance, and impaired activity tolerance which impact pt's ability to perform functional mobility and self care tasks. Pt required min-mod assist to roll side to side in order to perform hygiene after BM; max assist x2 for supine>sit EOB. Pt was able to tolerate sitting EOB for approx 5 minutes with supervision to min guard assist. Max assist x2 with +2 for equipment/safety and to perform squat pivot transfer to recliner with max assist for scooting hips back with verbal cues for technique/sequencing. Pt educated in pursed lip breathing to support SOB, HR, and BP. Pt O2 sats 96% within less than 1 minute seated, HR 106, BP 141/110. Pt very happy to be sitting up in the recliner at end of session. Pt will benefit from skilled OT  services to address noted impairments and functional deficits in order to maximize return to PLOF and minimize risk of further functional decline and increased caregiver burden/level of care. Recommend transition to [facility] or SNF pending progress with trach/ventilator.     Follow Up Recommendations  LTACH    Equipment Recommendations  Other (comment)(TBD)    Recommendations for Other Services       Precautions / Restrictions Precautions Precautions: Fall Restrictions Weight Bearing Restrictions: No Other Position/Activity Restrictions: HOB elevated >/= 30 deg      Mobility Bed Mobility Overal bed mobility: Needs Assistance Bed Mobility: Rolling;Supine to Sit Rolling: +2 for physical assistance;Max assist   Supine to sit: +2 for physical assistance;Max assist;HOB elevated(+1 for equipment)     General bed mobility comments: VC for hand placement and sequencing, but ultimately required significant assist to sit EOB, once EOB was able to remain upright for approx 5 minutes with supervision to min guard  Transfers Overall transfer level: Needs assistance Equipment used: Standard walker Transfers: Squat Pivot Transfers     Squat pivot transfers: Max assist;+2 physical assistance;+2 safety/equipment     General transfer comment: on 3rd attempt, was able to perform squat pivot transfer from EOB to recliner with max assist x2 and +2 for safety/equipment, max assist x2 to scoot hips back in recliner.    Balance Overall balance assessment: Needs assistance Sitting-balance support: Feet supported;Bilateral upper extremity supported Sitting balance-Leahy Scale: Fair Sitting balance - Comments: fair-   Standing balance support: Bilateral upper extremity supported Standing balance-Leahy Scale: Zero  ADL either performed or assessed with clinical judgement   ADL Overall ADL's : Needs assistance/impaired Eating/Feeding: NPO Eating/Feeding  Details (indicate cue type and reason): tracheostomy  Grooming: Sitting;Set up;Supervision/safety   Upper Body Bathing: Sitting;Moderate assistance;Maximal assistance   Lower Body Bathing: Bed level;Total assistance;Maximal assistance   Upper Body Dressing : Sitting;Moderate assistance;Maximal assistance   Lower Body Dressing: Bed level;Maximal assistance;Total assistance       Toileting- Clothing Manipulation and Hygiene: Total assistance;Bed level       Functional mobility during ADLs: Maximal assistance;+2 for physical assistance;+2 for safety/equipment;Cueing for sequencing;Rolling walker(+2 physical assist, with +2 for equipment/safety)       Vision Baseline Vision/History: Wears glasses Wears Glasses: At all times(does not have with her at the hospital) Patient Visual Report: No change from baseline Vision Assessment?: No apparent visual deficits     Perception     Praxis      Pertinent Vitals/Pain Pain Assessment: No/denies pain     Hand Dominance Right   Extremity/Trunk Assessment Upper Extremity Assessment Upper Extremity Assessment: Generalized weakness(grossly 3+/5 bilaterally)   Lower Extremity Assessment Lower Extremity Assessment: Generalized weakness(at least 3/5 bilaterally)   Cervical / Trunk Assessment Cervical / Trunk Assessment: Normal   Communication Communication Communication: Tracheostomy   Cognition Arousal/Alertness: Awake/alert Behavior During Therapy: WFL for tasks assessed/performed Overall Cognitive Status: Within Functional Limits for tasks assessed                                     General Comments  all lines/leads/trach/vent in place at start and at end of session; noted some mild edema in RUE    Exercises Other Exercises Other Exercises: Pt educated in pursed lip breathing to support SOB and decreased HR and BP after exertion. Pt able to demo good technique, requiring verbal cues occasionally to utilize    Shoulder Instructions      Home Living Family/patient expects to be discharged to:: Assisted living Living Arrangements: Alone                           Home Equipment: Walker - 4 wheels;Cane - single point          Prior Functioning/Environment Level of Independence: Needs assistance  Gait / Transfers Assistance Needed: Pt Mod I with amb facility distances with rollator with no fall history; pt Ind with bed mobility and transfers ADL's / Homemaking Assistance Needed: Pt dresses independently but daughter assists with bathing and meds            OT Problem List: Decreased strength;Decreased knowledge of use of DME or AE;Increased edema      OT Treatment/Interventions: Self-care/ADL training;Therapeutic exercise;Therapeutic activities;Energy conservation;DME and/or AE instruction;Patient/family education    OT Goals(Current goals can be found in the care plan section) Acute Rehab OT Goals Patient Stated Goal: get stronger OT Goal Formulation: With patient Time For Goal Achievement: 11/18/17 Potential to Achieve Goals: Good  OT Frequency: Min 2X/week   Barriers to D/C:            Co-evaluation              AM-PAC PT "6 Clicks" Daily Activity     Outcome Measure Help from another person eating meals?: Total Help from another person taking care of personal grooming?: A Little Help from another person toileting, which includes using toliet, bedpan, or urinal?: Total Help from  another person bathing (including washing, rinsing, drying)?: A Lot Help from another person to put on and taking off regular upper body clothing?: A Lot Help from another person to put on and taking off regular lower body clothing?: A Lot 6 Click Score: 11   End of Session Equipment Utilized During Treatment: Gait belt;Rolling walker  Activity Tolerance: Patient tolerated treatment well Patient left: in chair;with call bell/phone within reach  OT Visit Diagnosis: Other  abnormalities of gait and mobility (R26.89);Muscle weakness (generalized) (M62.81)                Time: 1638-4536 OT Time Calculation (min): 68 min Charges:  OT General Charges $OT Visit: 1 Visit OT Evaluation $OT Eval Moderate Complexity: 1 Mod OT Treatments $Self Care/Home Management : 38-52 mins G-Codes: OT G-codes **NOT FOR INPATIENT CLASS** Functional Assessment Tool Used: AM-PAC 6 Clicks Daily Activity;Clinical judgement Functional Limitation: Self care Self Care Current Status (I6803): At least 60 percent but less than 80 percent impaired, limited or restricted Self Care Goal Status (O1224): At least 40 percent but less than 60 percent impaired, limited or restricted   Jeni Salles, MPH, MS, OTR/L ascom [phone] 11/04/17, 1:29 PM

## 2017-11-04 NOTE — Progress Notes (Signed)
Physical Therapy Treatment Patient Details Name: Julia Crawford MRN: [id number] DOB: 1935-02-16 Today's Date: 11/04/2017    History of Present Illness Pt is an 82 yo F never smoker with history of DM II, hypothyroidism, myasthenia and asthma admitted via ED with 2-3 days of increasing SOB and diagnosed with acute on chronic hypercarbic respiratory failure due to asthma exacerbation. Initially required BiPAP in ED. Required intubation 10/21/17 for persistent bronchospasm, hypercarbia despite BiPAP.   Pt extubated 10/28/17 and tolerated well initially but developed progressive stridor and was re-intubated.  Pt noted to have severe subglottic stenosis upon re-intubation and trach tube placement performed 10/29/17.    PT Comments    Pt continues to present with deficits in strength, transfers, mobility, gait, balance, and activity tolerance.  Pt required hoyer lift for recliner chair to bed transfer and +2 max A for rolling and scooting in bed.  Pt tolerated therapeutic exercise with frequent short therapeutic rest breaks with vital signs WFL during session.  Pt will benefit from PT services in a SNF setting upon discharge to safely address above deficits for decreased caregiver assistance and eventual return to PLOF.      Follow Up Recommendations  SNF     Equipment Recommendations  Other (comment)(TBD at next venue of care)    Recommendations for Other Services       Precautions / Restrictions Precautions Precautions: Fall Restrictions Weight Bearing Restrictions: No Other Position/Activity Restrictions: HOB elevated >/= 30 deg    Mobility  Bed Mobility Overal bed mobility: Needs Assistance Bed Mobility: Rolling Rolling: +2 for physical assistance;Max assist         General bed mobility comments: VC for hand placement and sequencing  Transfers                 General transfer comment: Deferred this session secondary to pt fatigue  Ambulation/Gait              General Gait Details: Unable/unsafe to attempt   Stairs            Wheelchair Mobility    Modified Rankin (Stroke Patients Only)       Balance                                            Cognition Arousal/Alertness: Awake/alert Behavior During Therapy: WFL for tasks assessed/performed Overall Cognitive Status: Within Functional Limits for tasks assessed                                        Exercises Total Joint Exercises Ankle Circles/Pumps: Strengthening;Both;5 reps;10 reps(Manual resistance provided) Quad Sets: Strengthening;Both;5 reps;10 reps Gluteal Sets: Strengthening;Both;5 reps;10 reps Short Arc Quad: Strengthening;Both;5 reps;10 reps(Manual resistance provided) Heel Slides: Both;5 reps;10 reps;AAROM Hip ABduction/ADduction: AAROM;Both;5 reps;10 reps Straight Leg Raises: AAROM;Both;5 reps;10 reps Other Exercises Other Exercises: BLE Leg press x 10 with manual resistance     General Comments        Pertinent Vitals/Pain Pain Assessment: No/denies pain    Home Living                      Prior Function            PT Goals (current goals can now be found in the care plan section)  Progress towards PT goals: Progressing toward goals    Frequency    Min 2X/week      PT Plan Current plan remains appropriate    Co-evaluation              AM-PAC PT "6 Clicks" Daily Activity  Outcome Measure                   End of Session Equipment Utilized During Treatment: Oxygen Activity Tolerance: Patient limited by fatigue Patient left: in bed;with call bell/phone within reach;with bed alarm set Nurse Communication: Mobility status PT Visit Diagnosis: Muscle weakness (generalized) (M62.81);Difficulty in walking, not elsewhere classified (R26.2)     Time: 4944-9675 PT Time Calculation (min) (ACUTE ONLY): 29 min  Charges:  $Therapeutic Exercise: 23-37 mins                    G Codes:        DRoyetta Asal PT, DPT 11/04/17, 5:01 PM

## 2017-11-04 NOTE — Progress Notes (Signed)
[place] [facility] [place] NAME: Julia Crawford    MR#:  [id number]  DATE OF BIRTH:  1935-05-03  SUBJECTIVE:  CHIEF COMPLAINT:   Chief Complaint  Patient presents with  . Respiratory Distress   in the chair, alert. Purulent secretions from trach site. Not tolerating PSV REVIEW OF SYSTEMS:   Review of Systems  Constitutional: Positive for malaise/fatigue. Negative for chills and fever.  HENT: Negative for sore throat.   Eyes: Negative for blurred vision, double vision and pain.  Respiratory: Positive for cough and shortness of breath. Negative for hemoptysis and wheezing.   Cardiovascular: Positive for leg swelling. Negative for chest pain, palpitations and orthopnea.  Gastrointestinal: Negative for abdominal pain, constipation, diarrhea, heartburn, nausea and vomiting.  Genitourinary: Negative for dysuria and hematuria.  Musculoskeletal: Negative for back pain and joint pain.  Skin: Negative for rash.  Neurological: Positive for weakness. Negative for sensory change, speech change, focal weakness and headaches.  Endo/Heme/Allergies: Does not bruise/bleed easily.  Psychiatric/Behavioral: Negative for depression. The patient is not nervous/anxious.    DRUG ALLERGIES:   Allergies  Allergen Reactions  . Ace Inhibitors Cough    Other reaction(s): Unknown  . Apixaban     Other reaction(s): Unknown  . Azithromycin     Myasthenia Gravis Patient  . Butylene Glycol Other (See Comments)  . Codeine Itching and Swelling  . Doxycycline   . Lasix [Furosemide] Other (See Comments)    Chest and back pain  . Peanut Butter Flavor     rash?  . Telithromycin     Other reaction(s): Other (See Comments) Myasthenia Gravis Patient    VITALS:  Blood pressure 125/71, pulse 82, temperature 98.2 F (36.8 C), temperature source Oral, resp. rate 11, height 5' (1.524 m), weight 93.4 kg (205 lb 14.6 oz), SpO2 99 %.  PHYSICAL EXAMINATION:  GENERAL:  82  y.o.-year-old patient lying in the bed.  EYES: Pupils equal, round, reactive to light and accommodation. No scleral icterus. Extraocular muscles intact.  HEENT: Head atraumatic, normocephalic. Oropharynx and nasopharynx clear. Trach in place -purulent secretions around, dressing intact NECK:  Supple, no jugular venous distention. No thyroid enlargement, no tenderness.  LUNGS: Normal breath sounds bilaterally, wheezing, no crepitation. Trach CARDIOVASCULAR: S1, S2 normal. No murmurs, rubs, or gallops.  ABDOMEN: Soft, nontender, nondistended. Bowel sounds present. No organomegaly or mass.  EXTREMITIES: No pedal edema, cyanosis, or clubbing.  NEUROLOGIC: sedated SKIN: No obvious rash, lesion, or ulcer.   Physical Exam LABORATORY PANEL:   CBC Recent Labs  Lab 11/03/17 0439  WBC 5.9  HGB 10.3*  HCT 30.2*  PLT 252   ------------------------------------------------------------------------------------------------------------------  Chemistries  Recent Labs  Lab 10/30/17 0508 11/03/17 0439  NA 137  --   K 4.4  --   CL 104  --   CO2 27  --   GLUCOSE 107*  --   BUN 25*  --   CREATININE 0.41* 0.39*  CALCIUM 8.4*  --    ------------------------------------------------------------------------------------------------------------------  Cardiac Enzymes No results for input(s): TROPONINI in the last 168 hours. ------------------------------------------------------------------------------------------------------------------  RADIOLOGY:  No results found.  ASSESSMENT AND PLAN:   Active Problems:   Acute on chronic respiratory failure with hypercapnia (HCC)   Dyspnea   Pressure injury of skin  1.  Respiratory failure: Acute on chronic with hypoxia and hypercapnia.   - Status asthamaticus, tracheobronchitis Extubated on 10/28/2017, but reintubated due to subglottic stenosis Nebs.  Tracheostomy 11/21, dressing changed by ENT and cleaned the  area as there's concern for infection  -on Rocephin Transfer to LTAC when stable (eval tomorrow s/p day 7 per CM notes)  2.  CAD: Stable; continue aspirin  3.  Atrial fibrillation: Rate controlled; continue diltiazem  4.  CHF: Systolic; chronic 5.  Diabetes mellitus type 2: keep on ISS. 6. Hyperlipidemia: Continue statin therapy 7.  Myasthenia gravis: Stable; continue pyridostigmine. Dose adjusted after d/w pharmacy 8.  Depression: Continue sertraline 9.  DVT prophylaxis: Lovenox     CODE STATUS: DNR  TOTAL TIME TAKING CARE OF THIS PATIENT: 15 minutes.   Max Sane M.D on 11/04/2017   Between 7am to 6pm - Pager - [phone]  After 6pm go to [email or web address] - password [email or web address] [facility]  Office  [phone]  CC: Primary care physician; Jerrol Banana., MD  Note: This dictation was prepared with Dragon dictation along with smaller phrase technology. Any transcriptional errors that result from this process are unintentional.

## 2017-11-04 NOTE — Progress Notes (Signed)
SLP Cancellation Note  Patient Details Name: Julia Crawford MRN: [id number] DOB: Oct 20, 1935   Cancelled treatment:       Reason Eval/Treat Not Completed: Medical issues which prohibited therapy(chart reviewed; consulted NSG then met w/ pt).  Pt recently transferred to trach collar from vent support (over night). Upon review of pt's vitals, RR was increased b/t 28-33bpm w/ slight increase in HR. O2 sats remained mid-90's. Noted moerate+ tracheal secretions around the trach and in the trach collar mask itself. With this presentation, pt is not appropriate for PMV use/wear at this time. NSG reported pt was needing vent support at night d/t apnea issues; tracheal secretions are d/t wound secondary to device(trach).  ST services will f/u daily for ongoing assessment of appropriateness for PMV use/wear. It is recommended for use during oral intake to aid the swallow function. At this time, pt is unable to participate in a MBSS off the floor d/t respiratory needs per NSG. ST services will also monitor appropriate time for MBSS. Dietician consulted and will f/u w/ MD re: pt's nutritional support needs. Recommend frequent oral care d/t NPO status to aid oral hygiene and stimulation for swallowing.     Orinda Kenner, MS, CCC-SLP Watson,Katherine 11/04/2017, 1:52 PM

## 2017-11-04 NOTE — Progress Notes (Signed)
PULMONARY / CRITICAL CARE MEDICINE   Name: Julia Crawford MRN: [id number] DOB: Sep 22, 1935    ADMISSION DATE:  10/20/2017  PT PROFILE:   39 F never smoker with history of DM II, hypothyroidism, myasthenia and asthma admitted via ED with 2-3 days of increasing SOB. Initially required BiPAP in ED. Required intubation 11/12 for persistent bronchospasm, hypercarbia despite BiPAP  MAJOR EVENTS/TEST RESULTS: 11/11 Admitted with dx of acute/chronic hypercarbic respiratory failure due to asthma exacerbation 11/12 intubated 11/13 very tight bronchospasm with very prolonged expiratory time and very high PIPs with pt-vent dyssynchrony requiring NMB.  11/13 developed AF with very slow vent rate. Made DNR in event of cardiac arrest 11/14 Severe but improved bronchospasm. Able to F/C.  11/15 persistent severe bronchospasm.  Tolerated PSV mode only briefly.  Cognition intact on WUA.  Increased airway secretions 11/18 failed SBT 11/19 Passed SBT and extubated. Tolerated well initially but developed progressive stridor throughout day and re-intubated. Noted to have severe subglottic stenosis upon re-intubation 11/20 Family agreed to proceed with trach tube placement. ENT consulted 11/21 Tracheostomy tube placement 11/22 - 11/25 Weaning on PSV <> ATC. Little progress 11/24 Cefazolin initiated for trach tube site infection 11/26 Purulent secretions. Not tolerating PSV. abx expanded to ceftriaxone  INDWELLING DEVICES:: ETT 11/12 >> 11/19, 11/19 >> 11/21 L IJ CVL 11/12 >> 11/26 Trach tube 11/21 >>    MICRO DATA: MRSA PCR 11/11 >> NEG Resp 11/12 >> NOF Blood 11/11 >> NEG Resp 11/15 >> Klebsiella pneumoniae Resp 11/24 >> mult organisms, none predominant   ANTIMICROBIALS:  Ceftriaxone 11/15 >> 11/21, 11/26 >>  Cefazolin 11/24 >> 11/26      SUBJECTIVE:  RASS 0.  Synchronous on ventilator. Cognition fully intact. Purulent endotracheal secretions. Did not tolerate PSV  VITAL SIGNS: BP 125/71    Pulse 82   Temp 98.2 F (36.8 C) (Oral)   Resp 11   Ht 5' (1.524 m)   Wt 93.4 kg (205 lb 14.6 oz)   SpO2 99%   BMI 40.21 kg/m   HEMODYNAMICS:    VENTILATOR SETTINGS: Vent Mode: PRVC FiO2 (%):  [35 %] 35 % Set Rate:  [10 bmp] 10 bmp Vt Set:  [500 mL] 500 mL PEEP:  [5 cmH20-55 cmH20] 55 cmH20 Plateau Pressure:  [18 cmH20] 18 cmH20  INTAKE / OUTPUT: I/O last 3 completed shifts: In: 1460 [I.V.:1110; IV Piggyback:350] Out: 410 [Urine:410]  PHYSICAL EXAMINATION: General: RASS 0.  + F/C Neuro: CNs intact, MAEs HEENT: NCAT, sclerae white Cardiovascular: reg, no M Lungs: prolonged exp phase, no wheezes Abdomen: obese, soft, diminished BS Ext: warm, no edema Skin: no lesions noted  LABS:  BMET Recent Labs  Lab 10/29/17 0554 10/30/17 0508 11/03/17 0439  NA 139 137  --   K 4.4 4.4  --   CL 102 104  --   CO2 28 27  --   BUN 31* 25*  --   CREATININE 0.48 0.41* 0.39*  GLUCOSE 75 107*  --     Electrolytes Recent Labs  Lab 10/29/17 0554 10/30/17 0508  CALCIUM 8.6* 8.4*    CBC Recent Labs  Lab 10/29/17 0554 10/30/17 0508 11/03/17 0439  WBC 6.6 7.6 5.9  HGB 12.0 11.6* 10.3*  HCT 34.7* 34.7* 30.2*  PLT 222 239 252    Coag's No results for input(s): APTT, INR in the last 168 hours.  Sepsis Markers No results for input(s): LATICACIDVEN, PROCALCITON, O2SATVEN in the last 168 hours.  ABG No results for input(s): PHART, PCO2ART,  PO2ART in the last 168 hours.  Liver Enzymes No results for input(s): AST, ALT, ALKPHOS, BILITOT, ALBUMIN in the last 168 hours.  Cardiac Enzymes No results for input(s): TROPONINI, PROBNP in the last 168 hours.  Glucose Recent Labs  Lab 11/03/17 1616 11/03/17 1937 11/04/17 0038 11/04/17 0349 11/04/17 0740 11/04/17 1156  GLUCAP 170* 155* 146* 96 98 130*    CXR: No new film   ASSESSMENT / PLAN:  PULMONARY A: Acute/chronic respiratory failure Status asthmaticus Failed extubation due to stridor Subglottic  stenosis P:   Once she returns from tracheostomy tube placement, I expect that she can be rapidly liberated from mechanical ventilation.   CARDIOVASCULAR A:  PAF with RVR - rate presently controlled P:  Monitor per ICU protocol Cont full dose enox for AF Resume diltiazem as needed for rate control  RENAL A:   No issues P:   Monitor BMET intermittently Monitor I/Os Correct electrolytes as indicated   GASTROINTESTINAL A:   Obesity P:   SUP: IV famotidine  Resume TF's after replacement of NGT  HEMATOLOGIC A:   Macrocytic anemia - normal serum vitamin B12, RBC folate P:  DVT px: full dose enoxaparin Monitor CBC intermittently Transfuse per usual guidelines   INFECTIOUS A:   Klebsiella tracheobronchitis Trach stoma infection P:   Monitor temp, WBC count Micro and abx as above   ENDOCRINE A:   Type II DM - controlled P:   Continue SSI -changed to moderate scale 11/13 Continue Lantus-initiated 11/14  NEUROLOGIC A:   ICU/vent associated discomfort Myasthenia gravis Deconditioning P:   RASS goal: 0 Continue pyridostigmine PT evaluation and treatment Advance activity as able Discharge planning - might need LTAC  FAMILY: No family at bedside this a.m.   Merton Border, MD PCCM service Mobile [phone] Pager [phone] 11/04/2017, 3:57 PM

## 2017-11-05 ENCOUNTER — Other Ambulatory Visit: Payer: Self-pay | Admitting: Family Medicine

## 2017-11-05 DIAGNOSIS — J441 Chronic obstructive pulmonary disease with (acute) exacerbation: Secondary | ICD-10-CM

## 2017-11-05 LAB — GLUCOSE, CAPILLARY
Glucose-Capillary: 116 mg/dL — ABNORMAL HIGH (ref 65–99)
Glucose-Capillary: 128 mg/dL — ABNORMAL HIGH (ref 65–99)
Glucose-Capillary: 139 mg/dL — ABNORMAL HIGH (ref 65–99)
Glucose-Capillary: 186 mg/dL — ABNORMAL HIGH (ref 65–99)
Glucose-Capillary: 207 mg/dL — ABNORMAL HIGH (ref 65–99)
Glucose-Capillary: 286 mg/dL — ABNORMAL HIGH (ref 65–99)

## 2017-11-05 LAB — BASIC METABOLIC PANEL
Anion gap: 12 (ref 5–15)
BUN: 13 mg/dL (ref 6–20)
CALCIUM: 8.8 mg/dL — AB (ref 8.9–10.3)
CO2: 24 mmol/L (ref 22–32)
Chloride: 98 mmol/L — ABNORMAL LOW (ref 101–111)
Creatinine, Ser: 0.46 mg/dL (ref 0.44–1.00)
GFR calc Af Amer: >60 mL/min (ref >60)
GLUCOSE: 239 mg/dL — AB (ref 65–99)
Potassium: 3.7 mmol/L (ref 3.5–5.1)
Sodium: 134 mmol/L — ABNORMAL LOW (ref 135–145)

## 2017-11-05 LAB — CULTURE, RESPIRATORY W GRAM STAIN

## 2017-11-05 LAB — MAGNESIUM: Magnesium: 1.6 mg/dL — ABNORMAL LOW (ref 1.7–2.4)

## 2017-11-05 MED ORDER — FAMOTIDINE 20 MG PO TABS
20.0000 mg | ORAL_TABLET | Freq: Two times a day (BID) | ORAL | Status: DC
  Administered 2017-11-05 – 2017-11-06 (×4): 20 mg
  Filled 2017-11-05 (×4): qty 1

## 2017-11-05 MED ORDER — PREDNISONE 10 MG PO TABS
10.0000 mg | ORAL_TABLET | Freq: Every day | ORAL | Status: DC
  Administered 2017-11-06 – 2017-11-07 (×2): 10 mg
  Filled 2017-11-05 (×2): qty 1

## 2017-11-05 MED ORDER — FREE WATER
100.0000 mL | Freq: Three times a day (TID) | Status: DC
  Administered 2017-11-05 – 2017-11-08 (×11): 100 mL

## 2017-11-05 MED ORDER — INSULIN ASPART 100 UNIT/ML ~~LOC~~ SOLN
3.0000 [IU] | Freq: Four times a day (QID) | SUBCUTANEOUS | Status: DC
  Administered 2017-11-05 – 2017-11-07 (×7): 3 [IU] via SUBCUTANEOUS
  Filled 2017-11-05 (×7): qty 1

## 2017-11-05 MED ORDER — PYRIDOSTIGMINE BROMIDE 60 MG PO TABS
60.0000 mg | ORAL_TABLET | Freq: Three times a day (TID) | ORAL | Status: DC
  Administered 2017-11-05 – 2017-11-08 (×11): 60 mg
  Filled 2017-11-05 (×12): qty 1

## 2017-11-05 NOTE — Progress Notes (Signed)
Pt placed in recliner using lift. Pt tolerated well.   1500- Pt placed on trach collar by RT, Pt tolerating well.Tube feeds held due to high risk of tube displacement and aspiration.   1653- Called to patients room, pt breathing 50 RR, O2 sats 83 %, and HR 160 bpm. Pt placed back on ventilator. Pt continues to want to sit in bedside recliner and has been observed making frequent small changes in body position. Will continue to assess.

## 2017-11-05 NOTE — Progress Notes (Signed)
PULMONARY / CRITICAL CARE MEDICINE   Name: Julia Crawford MRN: [id number] DOB: [birth year]    ADMISSION DATE:  10/20/2017  PT PROFILE:   5 F never smoker with history of DM II, hypothyroidism, myasthenia and asthma admitted via ED with 2-3 days of increasing SOB. Initially required BiPAP in ED. Required intubation 11/12 for persistent bronchospasm, hypercarbia despite BiPAP  MAJOR EVENTS/TEST RESULTS: 11/11 Admitted with dx of acute/chronic hypercarbic respiratory failure due to asthma exacerbation 11/12 intubated 11/13 very tight bronchospasm with very prolonged expiratory time and very high PIPs with pt-vent dyssynchrony requiring NMB.  11/13 developed AF with very slow vent rate. Made DNR in event of cardiac arrest 11/14 Severe but improved bronchospasm. Able to F/C.  11/15 persistent severe bronchospasm.  Tolerated PSV mode only briefly.  Cognition intact on WUA.  Increased airway secretions 11/18 failed SBT 11/19 Passed SBT and extubated. Tolerated well initially but developed progressive stridor throughout day and re-intubated. Noted to have severe subglottic stenosis upon re-intubation 11/20 Family agreed to proceed with trach tube placement. ENT consulted 11/21 Tracheostomy tube placement 11/22 - 11/25 Weaning on PSV <> ATC. Little progress 11/24 Cefazolin initiated for trach tube site infection 11/26 Purulent secretions. Not tolerating PSV. abx expanded to ceftriaxone   INDWELLING DEVICES:: ETT 11/12 >> 11/19, 11/19 >> 11/21 L IJ CVL 11/12 >> 11/26 Trach tube 11/21 >>    MICRO DATA: MRSA PCR 11/11 >> NEG Resp 11/12 >> NOF Blood 11/11 >> NEG Resp 11/15 >> Klebsiella pneumoniae Resp 11/24 >> mult organisms, none predominant   ANTIMICROBIALS:  Ceftriaxone 11/15 >> 11/21, 11/26 >>  Cefazolin 11/24 >> 11/26      SUBJECTIVE:  NSC.  Remains on full vent support this AM. To attempt ATC later today. Cognition fully intact. Purulent endotracheal secretions - improving.     VITAL SIGNS: BP (!) 114/99   Pulse (!) 107   Temp 98.6 F (37 C) (Oral)   Resp (!) 21   Ht 5' (1.524 m)   Wt 92.7 kg (204 lb 5.9 oz)   SpO2 98%   BMI 39.91 kg/m   HEMODYNAMICS:    VENTILATOR SETTINGS: Vent Mode: PRVC FiO2 (%):  [35 %] 35 % Set Rate:  [10 bmp] 10 bmp Vt Set:  [500 mL] 500 mL PEEP:  [5 cmH20] 5 cmH20 Plateau Pressure:  [22 cmH20-27 cmH20] 24 cmH20  INTAKE / OUTPUT: I/O last 3 completed shifts: In: 1326.8 [I.V.:630; NG/GT:646.8; IV Piggyback:50] Out: 150 [Urine:150]  PHYSICAL EXAMINATION: General: RASS 0.  + F/C Neuro: CNs intact, MAEs HEENT: NCAT, sclerae white Cardiovascular: reg, no M Lungs: prolonged exp phase, no wheezes Abdomen: obese, soft, diminished BS Ext: warm, no edema Skin: no lesions noted  LABS:  BMET Recent Labs  Lab 10/30/17 0508 11/03/17 0439  NA 137  --   K 4.4  --   CL 104  --   CO2 27  --   BUN 25*  --   CREATININE 0.41* 0.39*  GLUCOSE 107*  --     Electrolytes Recent Labs  Lab 10/30/17 0508  CALCIUM 8.4*    CBC Recent Labs  Lab 10/30/17 0508 11/03/17 0439  WBC 7.6 5.9  HGB 11.6* 10.3*  HCT 34.7* 30.2*  PLT 239 252    Coag's No results for input(s): APTT, INR in the last 168 hours.  Sepsis Markers No results for input(s): LATICACIDVEN, PROCALCITON, O2SATVEN in the last 168 hours.  ABG No results for input(s): PHART, PCO2ART, PO2ART in the last 168  hours.  Liver Enzymes No results for input(s): AST, ALT, ALKPHOS, BILITOT, ALBUMIN in the last 168 hours.  Cardiac Enzymes No results for input(s): TROPONINI, PROBNP in the last 168 hours.  Glucose Recent Labs  Lab 11/04/17 1156 11/04/17 1609 11/04/17 1944 11/04/17 2325 11/05/17 0355 11/05/17 0753  GLUCAP 130* 149* 129* 110* 128* 139*    CXR: No new film   ASSESSMENT / PLAN:  PULMONARY A: Acute/chronic respiratory failure Status asthmaticus, resolved Prolonged ventilator dependence Subglottic stenosis P:   Cont vent support  - settings reviewed and/or adjusted PSV > ATC as tolerated Cont vent bundle  CARDIOVASCULAR A:  PAF - rate controlled P:  Monitor per ICU protocol Cont full dose enox for AF  RENAL A:   No issues P:   Monitor BMET intermittently Monitor I/Os Correct electrolytes as indicated   GASTROINTESTINAL A:   Obesity P:   SUP: IV famotidine  Cont TF protocol  HEMATOLOGIC A:   Macrocytic anemia - normal serum vitamin B12, RBC folate P:  DVT px: full dose enoxaparin Monitor CBC intermittently Transfuse per usual guidelines   INFECTIOUS A:   Klebsiella tracheobronchitis Trach stoma infection P:   Monitor temp, WBC count Micro and abx as above   ENDOCRINE A:   Type II DM - controlled P:   Continue SSI -changed to moderate scale 11/13 Continue Lantus-initiated 11/14  NEUROLOGIC A:   ICU/vent associated discomfort Myasthenia gravis Deconditioning P:   RASS goal: 0 Continue pyridostigmine PT evaluation and treatment Advance activity as able Discharge planning - might need LTAC  FAMILY: No family at bedside this a.m.   Merton Border, MD PCCM service Mobile [phone] Pager [phone] 11/05/2017, 12:22 PM

## 2017-11-05 NOTE — Progress Notes (Signed)
Physical Therapy Treatment Patient Details Name: Julia Crawford MRN: [id number] DOB: [birth year] Today's Date: 11/05/2017    History of Present Illness Pt is an 82 yo F never smoker with history of DM II, hypothyroidism, myasthenia and asthma admitted via ED with 2-3 days of increasing SOB and diagnosed with acute on chronic hypercarbic respiratory failure due to asthma exacerbation. Initially required BiPAP in ED. Required intubation 10/21/17 for persistent bronchospasm, hypercarbia despite BiPAP.   Pt extubated 10/28/17 and tolerated well initially but developed progressive stridor and was re-intubated.  Pt noted to have severe subglottic stenosis upon re-intubation and trach tube placement performed 10/29/17.    PT Comments    Pt in recliner upon arrival by nursing with lift system.  Participated in exercises as described below.  Pt tolerated overall increased exercises this session.  Vitals monitored throughout session and rest breaks given as needed.     Follow Up Recommendations  SNF     Equipment Recommendations       Recommendations for Other Services       Precautions / Restrictions Precautions Precautions: Fall Restrictions Weight Bearing Restrictions: No Other Position/Activity Restrictions: HOB elevated >/= 30 deg    Mobility  Bed Mobility               General bed mobility comments: in recliner upon arrival with lift by nursing  Transfers                 General transfer comment: Deferred this session secondary to pt fatigue  Ambulation/Gait             General Gait Details: Unable/unsafe to attempt   Stairs            Wheelchair Mobility    Modified Rankin (Stroke Patients Only)       Balance                                            Cognition Arousal/Alertness: Awake/alert Behavior During Therapy: WFL for tasks assessed/performed;Flat affect Overall Cognitive Status: Within Functional Limits for tasks  assessed                                        Exercises Other Exercises Other Exercises: BLE ex 2 x 10 for ankle pumps, LAQ, ab/add and marches in sitting, glut sets, hip ab/add in long sitting.    General Comments        Pertinent Vitals/Pain Pain Assessment: No/denies pain    Home Living                      Prior Function            PT Goals (current goals can now be found in the care plan section) Progress towards PT goals: Progressing toward goals    Frequency    Min 2X/week      PT Plan Current plan remains appropriate    Co-evaluation              AM-PAC PT "6 Clicks" Daily Activity  Outcome Measure  Difficulty turning over in bed (including adjusting bedclothes, sheets and blankets)?: Unable Difficulty moving from lying on back to sitting on the side of the bed? : Unable Difficulty sitting down on and  standing up from a chair with arms (e.g., wheelchair, bedside commode, etc,.)?: Unable Help needed moving to and from a bed to chair (including a wheelchair)?: Total Help needed walking in hospital room?: Total Help needed climbing 3-5 steps with a railing? : Total 6 Click Score: 6    End of Session Equipment Utilized During Treatment: Oxygen Activity Tolerance: Patient limited by fatigue Patient left: in chair;with chair alarm set;with call bell/phone within reach;with family/visitor present;Other (comment)         Time: 9678-9381 PT Time Calculation (min) (ACUTE ONLY): 18 min  Charges:  $Therapeutic Exercise: 8-22 mins                    G Codes:       Chesley Noon, PTA 11/05/17, 3:12 PM

## 2017-11-05 NOTE — Progress Notes (Signed)
[place] at [place] NAME: Julia Crawford    MR#:  [id number]  DATE OF BIRTH:  Jun 07, 1935  SUBJECTIVE:  CHIEF COMPLAINT:   Chief Complaint  Patient presents with  . Respiratory Distress  Sitting in the chair, family at bedside, off ventilator, getting feed through [place], on trach collar REVIEW OF SYSTEMS:   Review of Systems  Constitutional: Positive for malaise/fatigue. Negative for chills and fever.  HENT: Negative for sore throat.   Eyes: Negative for blurred vision, double vision and pain.  Respiratory: Positive for cough and shortness of breath. Negative for hemoptysis and wheezing.   Cardiovascular: Positive for leg swelling. Negative for chest pain, palpitations and orthopnea.  Gastrointestinal: Negative for abdominal pain, constipation, diarrhea, heartburn, nausea and vomiting.  Genitourinary: Negative for dysuria and hematuria.  Musculoskeletal: Negative for back pain and joint pain.  Skin: Negative for rash.  Neurological: Positive for weakness. Negative for sensory change, speech change, focal weakness and headaches.  Endo/Heme/Allergies: Does not bruise/bleed easily.  Psychiatric/Behavioral: Negative for depression. The patient is not nervous/anxious.    DRUG ALLERGIES:   Allergies  Allergen Reactions  . Ace Inhibitors Cough    Other reaction(s): Unknown  . Apixaban     Other reaction(s): Unknown  . Azithromycin     Myasthenia Gravis Patient  . Butylene Glycol Other (See Comments)  . Codeine Itching and Swelling  . Doxycycline   . Lasix [Furosemide] Other (See Comments)    Chest and back pain  . Peanut Butter Flavor     rash?  . Telithromycin     Other reaction(s): Other (See Comments) Myasthenia Gravis Patient    VITALS:  Blood pressure 108/65, pulse (!) 54, temperature 98.5 F (36.9 C), temperature source Axillary, resp. rate (!) 27, height 5' (1.524 m), weight 92.7 kg (204 lb 5.9 oz), SpO2 99 %.  PHYSICAL  EXAMINATION:  GENERAL:  82 y.o.-year-old patient lying in the bed.  EYES: Pupils equal, round, reactive to light and accommodation. No scleral icterus. Extraocular muscles intact.  HEENT: Head atraumatic, normocephalic. Oropharynx and nasopharynx clear. Trach in place,dressing intact NECK:  Supple, no jugular venous distention. No thyroid enlargement, no tenderness.  LUNGS: Normal breath sounds bilaterally, wheezing, no crepitation. Trach CARDIOVASCULAR: S1, S2 normal. No murmurs, rubs, or gallops.  ABDOMEN: Soft, nontender, nondistended. Bowel sounds present. No organomegaly or mass.  EXTREMITIES: No pedal edema, cyanosis, or clubbing.  NEUROLOGIC: sedated SKIN: No obvious rash, lesion, or ulcer.   Physical Exam LABORATORY PANEL:   CBC Recent Labs  Lab 11/03/17 0439  WBC 5.9  HGB 10.3*  HCT 30.2*  PLT 252   ------------------------------------------------------------------------------------------------------------------  Chemistries  Recent Labs  Lab 11/05/17 1440  NA 134*  K 3.7  CL 98*  CO2 24  GLUCOSE 239*  BUN 13  CREATININE 0.46  CALCIUM 8.8*  MG 1.6*   ------------------------------------------------------------------------------------------------------------------  Cardiac Enzymes No results for input(s): TROPONINI in the last 168 hours. ------------------------------------------------------------------------------------------------------------------  RADIOLOGY:  Dg Abd 1 View  Result Date: 11/04/2017 CLINICAL DATA:  NG tube placement EXAM: ABDOMEN - 1 VIEW COMPARISON:  None. FINDINGS: The tip of the feeding tube is in the mid stomach. Markedly distended loops of large bowel are noted. There is no obvious free intraperitoneal gas. IMPRESSION: Feeding tube tip is in the mid stomach. Electronically Signed   By: Marybelle Killings M.D.   On: 11/04/2017 20:04   Dg Chest Port 1 View  Result Date: 11/04/2017 CLINICAL DATA:  82 y/o  F; NJ tube placement. EXAM:  PORTABLE CHEST 1 VIEW COMPARISON:  11/02/2017 chest radiograph FINDINGS: Stable cardiomegaly. Aortic atherosclerosis with calcification. Clear lungs. Stable chronic blunting of left costal diaphragmatic angle. No acute osseous abnormality is evident. Enteric tube tip projects over the gastric body. IMPRESSION: 1. Stable enteric tube projecting over gastric body. 2. Cardiomegaly and aortic atherosclerosis. Stable blunting of left costal diaphragmatic angle. Electronically Signed   By: Kristine Garbe M.D.   On: 11/04/2017 20:14    ASSESSMENT AND PLAN:   Active Problems:   Acute on chronic respiratory failure with hypercapnia (HCC)   Dyspnea   Pressure injury of skin  1.  Respiratory failure: Acute on chronic with hypoxia and hypercapnia.   - Status asthamaticus, tracheobronchitis Extubated on 10/28/2017, but reintubated due to subglottic stenosis - Tracheostomy 11/21, dressing changed by ENT,on Rocephin Transfer to LTAC when stable -it may take up to 72 hours for select specialty to give decision on acceptance, it is under review per care management - on trach collar, Tube feeds through DBT 2.  CAD: Stable; continue aspirin 3.  Atrial fibrillation: Rate controlled; continue diltiazem 4.  CHF: Systolic; chronic 5.  Diabetes mellitus type 2: keep on ISS. 6. Hyperlipidemia: Continue statin therapy 7.  Myasthenia gravis: Stable; continue pyridostigmine 8.  Depression: Continue sertraline 9.  DVT prophylaxis: Lovenox     CODE STATUS: DNR  TOTAL TIME TAKING CARE OF THIS PATIENT: 15 minutes.   Max Sane M.D on 11/05/2017   Between 7am to 6pm - Pager - [phone]  After 6pm go to [email or web address] - password [email or web address] [facility]  Office  [phone]  CC: Primary care physician; Jerrol Banana., MD  Note: This dictation was prepared with Dragon dictation along with smaller phrase technology. Any transcriptional errors that result from this process  are unintentional.

## 2017-11-06 LAB — GLUCOSE, CAPILLARY
Glucose-Capillary: 117 mg/dL — ABNORMAL HIGH (ref 65–99)
Glucose-Capillary: 121 mg/dL — ABNORMAL HIGH (ref 65–99)
Glucose-Capillary: 168 mg/dL — ABNORMAL HIGH (ref 65–99)
Glucose-Capillary: 178 mg/dL — ABNORMAL HIGH (ref 65–99)
Glucose-Capillary: 182 mg/dL — ABNORMAL HIGH (ref 65–99)
Glucose-Capillary: 214 mg/dL — ABNORMAL HIGH (ref 65–99)

## 2017-11-06 MED ORDER — TRAZODONE HCL 50 MG PO TABS
50.0000 mg | ORAL_TABLET | Freq: Every evening | ORAL | Status: DC | PRN
  Administered 2017-11-06: 50 mg via ORAL
  Filled 2017-11-06: qty 1

## 2017-11-06 MED ORDER — INSULIN GLARGINE 100 UNIT/ML ~~LOC~~ SOLN
10.0000 [IU] | Freq: Every day | SUBCUTANEOUS | Status: DC
  Administered 2017-11-06 – 2017-11-08 (×3): 10 [IU] via SUBCUTANEOUS
  Filled 2017-11-06 (×5): qty 0.1

## 2017-11-06 MED ORDER — BACITRACIN-NEOMYCIN-POLYMYXIN 400-5-5000 EX OINT
TOPICAL_OINTMENT | Freq: Two times a day (BID) | CUTANEOUS | Status: DC
  Administered 2017-11-06 – 2017-11-08 (×6): 1 via TOPICAL
  Filled 2017-11-06 (×6): qty 1

## 2017-11-06 MED ORDER — RIVAROXABAN 20 MG PO TABS
20.0000 mg | ORAL_TABLET | Freq: Every day | ORAL | Status: DC
  Administered 2017-11-06 – 2017-11-08 (×3): 20 mg via ORAL
  Filled 2017-11-06 (×3): qty 1

## 2017-11-06 NOTE — Progress Notes (Signed)
[place] at [place] NAME: Reene Harlacher    MR#:  [id number]  DATE OF BIRTH:  1935/06/27  SUBJECTIVE:  CHIEF COMPLAINT:   Chief Complaint  Patient presents with  . Respiratory Distress  doing well, wanting to sit up REVIEW OF SYSTEMS:   Review of Systems  Constitutional: Positive for malaise/fatigue. Negative for chills and fever.  HENT: Negative for sore throat.   Eyes: Negative for blurred vision, double vision and pain.  Respiratory: Positive for cough and shortness of breath. Negative for hemoptysis and wheezing.   Cardiovascular: Positive for leg swelling. Negative for chest pain, palpitations and orthopnea.  Gastrointestinal: Negative for abdominal pain, constipation, diarrhea, heartburn, nausea and vomiting.  Genitourinary: Negative for dysuria and hematuria.  Musculoskeletal: Negative for back pain and joint pain.  Skin: Negative for rash.  Neurological: Positive for weakness. Negative for sensory change, speech change, focal weakness and headaches.  Endo/Heme/Allergies: Does not bruise/bleed easily.  Psychiatric/Behavioral: Negative for depression. The patient is not nervous/anxious.    DRUG ALLERGIES:   Allergies  Allergen Reactions  . Ace Inhibitors Cough    Other reaction(s): Unknown  . Apixaban     Other reaction(s): Unknown  . Azithromycin     Myasthenia Gravis Patient  . Butylene Glycol Other (See Comments)  . Codeine Itching and Swelling  . Doxycycline   . Lasix [Furosemide] Other (See Comments)    Chest and back pain  . Peanut Butter Flavor     rash?  . Telithromycin     Other reaction(s): Other (See Comments) Myasthenia Gravis Patient    VITALS:  Blood pressure 131/77, pulse (!) 110, temperature 98.6 F (37 C), temperature source Oral, resp. rate (!) 25, height 5' (1.524 m), weight 92.7 kg (204 lb 5.9 oz), SpO2 100 %.  PHYSICAL EXAMINATION:  GENERAL:  82 y.o.-year-old patient lying in the bed.  EYES:  Pupils equal, round, reactive to light and accommodation. No scleral icterus. Extraocular muscles intact.  HEENT: Head atraumatic, normocephalic. Oropharynx and nasopharynx clear. Trach in place,dressing intact NECK:  Supple, no jugular venous distention. No thyroid enlargement, no tenderness.  LUNGS: Normal breath sounds bilaterally, wheezing, no crepitation. Trach CARDIOVASCULAR: S1, S2 normal. No murmurs, rubs, or gallops.  ABDOMEN: Soft, nontender, nondistended. Bowel sounds present. No organomegaly or mass.  EXTREMITIES: No pedal edema, cyanosis, or clubbing.  NEUROLOGIC: sedated SKIN: No obvious rash, lesion, or ulcer.   Physical Exam LABORATORY PANEL:   CBC Recent Labs  Lab 11/03/17 0439  WBC 5.9  HGB 10.3*  HCT 30.2*  PLT 252   ------------------------------------------------------------------------------------------------------------------  Chemistries  Recent Labs  Lab 11/05/17 1440  NA 134*  K 3.7  CL 98*  CO2 24  GLUCOSE 239*  BUN 13  CREATININE 0.46  CALCIUM 8.8*  MG 1.6*   ------------------------------------------------------------------------------------------------------------------  Cardiac Enzymes No results for input(s): TROPONINI in the last 168 hours. ------------------------------------------------------------------------------------------------------------------  RADIOLOGY:  Dg Abd 1 View  Result Date: 11/04/2017 CLINICAL DATA:  NG tube placement EXAM: ABDOMEN - 1 VIEW COMPARISON:  None. FINDINGS: The tip of the feeding tube is in the mid stomach. Markedly distended loops of large bowel are noted. There is no obvious free intraperitoneal gas. IMPRESSION: Feeding tube tip is in the mid stomach. Electronically Signed   By: Marybelle Killings M.D.   On: 11/04/2017 20:04   Dg Chest Port 1 View  Result Date: 11/04/2017 CLINICAL DATA:  82 y/o  F; NJ tube placement. EXAM: PORTABLE CHEST 1 VIEW  COMPARISON:  11/02/2017 chest radiograph FINDINGS: Stable  cardiomegaly. Aortic atherosclerosis with calcification. Clear lungs. Stable chronic blunting of left costal diaphragmatic angle. No acute osseous abnormality is evident. Enteric tube tip projects over the gastric body. IMPRESSION: 1. Stable enteric tube projecting over gastric body. 2. Cardiomegaly and aortic atherosclerosis. Stable blunting of left costal diaphragmatic angle. Electronically Signed   By: Kristine Garbe M.D.   On: 11/04/2017 20:14    ASSESSMENT AND PLAN:   Active Problems:   Acute on chronic respiratory failure with hypercapnia (HCC)   Dyspnea   Pressure injury of skin  1.  Respiratory failure: Acute on chronic with hypoxia and hypercapnia.   - Status asthamaticus, tracheobronchitis Extubated on 10/28/2017, but reintubated due to subglottic stenosis - Tracheostomy 11/21, dressing changed by ENT, continue Rocephin Transfer to LTAC when stable -it may take up to 72 hours for select specialty to give decision on acceptance, it is under review per care management - on trach collar, Tube feeds through DBT 2.  CAD: Stable; continue aspirin 3.  Atrial fibrillation: Rate controlled; continue diltiazem 4.  CHF: Systolic; chronic 5.  Diabetes mellitus type 2: keep on ISS. 6. Hyperlipidemia: Continue statin therapy 7.  Myasthenia gravis: Stable; continue pyridostigmine 8.  Depression: Continue sertraline 9.  DVT prophylaxis: Lovenox     CODE STATUS: DNR  TOTAL TIME TAKING CARE OF THIS PATIENT: 15 minutes.   Max Sane M.D on 11/06/2017   Between 7am to 6pm - Pager - [phone]  After 6pm go to [email or web address] - password [email or web address] [facility]  Office  [phone]  CC: Primary care physician; Jerrol Banana., MD  Note: This dictation was prepared with Dragon dictation along with smaller phrase technology. Any transcriptional errors that result from this process are unintentional.

## 2017-11-06 NOTE — Progress Notes (Signed)
Patient tolerating trach collar well today- she has been on trach collar since around 8am on 40% Fi02.  Vitals stable.  Pt in chair position throughout most of the day- tolerated well.

## 2017-11-06 NOTE — Progress Notes (Signed)
PULMONARY / CRITICAL CARE MEDICINE   Name: Julia Crawford MRN: [id number] DOB: [birth year]    ADMISSION DATE:  10/20/2017  PT PROFILE:   37 F never smoker with history of DM II, hypothyroidism, myasthenia and asthma admitted via ED with 2-3 days of increasing SOB. Initially required BiPAP in ED. Required intubation 11/12 for persistent bronchospasm, hypercarbia despite BiPAP  MAJOR EVENTS/TEST RESULTS: 11/11 Admitted with dx of acute/chronic hypercarbic respiratory failure due to asthma exacerbation 11/12 intubated 11/13 very tight bronchospasm with very prolonged expiratory time and very high PIPs with pt-vent dyssynchrony requiring NMB.  11/13 developed AF with very slow vent rate. Made DNR in event of cardiac arrest 11/14 Severe but improved bronchospasm. Able to F/C.  11/15 persistent severe bronchospasm.  Tolerated PSV mode only briefly.  Cognition intact on WUA.  Increased airway secretions 11/18 failed SBT 11/19 Passed SBT and extubated. Tolerated well initially but developed progressive stridor throughout day and re-intubated. Noted to have severe subglottic stenosis upon re-intubation 11/20 Family agreed to proceed with trach tube placement. ENT consulted 11/21 Tracheostomy tube placement 11/22 - 11/25 Weaning on PSV <> ATC. Little progress 11/24 Cefazolin initiated for trach tube site infection 11/26 Purulent secretions. Not tolerating PSV. abx expanded to ceftriaxone 11/28 tolerating ATC.  Cognition intact  INDWELLING DEVICES:: ETT 11/12 >> 11/19, 11/19 >> 11/21 L IJ CVL 11/12 >> 11/26 Trach tube 11/21 >>    MICRO DATA: MRSA PCR 11/11 >> NEG Resp 11/12 >> NOF Blood 11/11 >> NEG Resp 11/15 >> Klebsiella pneumoniae Resp 11/24 >> mult organisms, none predominant  Resp 11/26 >>   ANTIMICROBIALS:  Ceftriaxone 11/15 >> 11/21, 11/26 >>  Cefazolin 11/24 >> 11/26      SUBJECTIVE:  NSC. Cognition fully intact. Resp secretions much improved    VITAL SIGNS: BP 130/77    Pulse (!) 110   Temp 98.8 F (37.1 C) (Oral)   Resp (!) 33   Ht 5' (1.524 m)   Wt 92.7 kg (204 lb 5.9 oz)   SpO2 99%   BMI 39.91 kg/m   HEMODYNAMICS:    VENTILATOR SETTINGS: Vent Mode: PRVC FiO2 (%):  [35 %-40 %] 40 % Set Rate:  [10 bmp] 10 bmp Vt Set:  [500 mL] 500 mL PEEP:  [5 cmH20] 5 cmH20 Plateau Pressure:  [17 cmH20] 17 cmH20  INTAKE / OUTPUT: I/O last 3 completed shifts: In: 1631.8 [I.V.:10; NG/GT:1621.8] Out: -   PHYSICAL EXAMINATION: General: RASS 0.  + F/C Neuro: CNs intact, MAEs HEENT: NCAT, sclerae white Cardiovascular: reg, no M Lungs: no wheezes, few rhonchi Abdomen: obese, soft, diminished BS Ext: warm, no edema Skin: no lesions noted  LABS:  BMET Recent Labs  Lab 11/03/17 0439 11/05/17 1440  NA  --  134*  K  --  3.7  CL  --  98*  CO2  --  24  BUN  --  13  CREATININE 0.39* 0.46  GLUCOSE  --  239*    Electrolytes Recent Labs  Lab 11/05/17 1440  CALCIUM 8.8*  MG 1.6*    CBC Recent Labs  Lab 11/03/17 0439  WBC 5.9  HGB 10.3*  HCT 30.2*  PLT 252    Coag's No results for input(s): APTT, INR in the last 168 hours.  Sepsis Markers No results for input(s): LATICACIDVEN, PROCALCITON, O2SATVEN in the last 168 hours.  ABG No results for input(s): PHART, PCO2ART, PO2ART in the last 168 hours.  Liver Enzymes No results for input(s): AST, ALT, ALKPHOS, BILITOT,  ALBUMIN in the last 168 hours.  Cardiac Enzymes No results for input(s): TROPONINI, PROBNP in the last 168 hours.  Glucose Recent Labs  Lab 11/05/17 1556 11/05/17 1939 11/05/17 2343 11/06/17 0331 11/06/17 0743 11/06/17 1158  GLUCAP 286* 207* 116* 117* 178* 214*    CXR: No new film   ASSESSMENT / PLAN:  PULMONARY A: Acute/chronic respiratory failure Status asthmaticus, resolved Prolonged ventilator dependence Subglottic stenosis Tracheostomy status P:   Cont vent support - settings reviewed and/or adjusted PSV > ATC as tolerated Cont vent  bundle SLP eval for possible Passy-Muir valve  CARDIOVASCULAR A:  PAF - rate controlled P:  Monitor per ICU protocol Change enoxaparin to rivaroxaban   RENAL A:   No issues P:   Monitor BMET intermittently Monitor I/Os Correct electrolytes as indicated   GASTROINTESTINAL A:   Obesity P:   SUP: enteral famotidine  Cont TF protocol  HEMATOLOGIC A:   Macrocytic anemia - normal serum vitamin B12, RBC folate P:  DVT px: Rivaroxaban Monitor CBC intermittently Transfuse per usual guidelines   INFECTIOUS A:   Klebsiella tracheobronchitis Trach stoma infection P:   Monitor temp, WBC count Micro and abx as above   ENDOCRINE A:   Type II DM - controlled P:   Continue moderate scale SSI  Continue Lantus  NEUROLOGIC A:   ICU/vent associated discomfort Myasthenia gravis Deconditioning P:   RASS goal: 0 Continue pyridostigmine PT evaluation and treatment Advance activity as able Discharge planning - Seeking LTAC  FAMILY: No family at bedside this a.m.   Merton Border, MD PCCM service Mobile [phone] Pager [phone] 11/06/2017, 2:16 PM

## 2017-11-06 NOTE — Progress Notes (Signed)
[place] for Rivaroxaban Dosing Indication: atrial fibrillation  Allergies  Allergen Reactions  . Ace Inhibitors Cough    Other reaction(s): Unknown  . Apixaban     Other reaction(s): Unknown  . Azithromycin     Myasthenia Gravis Patient  . Butylene Glycol Other (See Comments)  . Codeine Itching and Swelling  . Doxycycline   . Lasix [Furosemide] Other (See Comments)    Chest and back pain  . Peanut Butter Flavor     rash?  . Telithromycin     Other reaction(s): Other (See Comments) Myasthenia Gravis Patient    Patient Measurements: Height: 5' (152.4 cm) Weight: 204 lb 5.9 oz (92.7 kg) IBW/kg (Calculated) : 45.5  Vital Signs: Temp: 98.8 F (37.1 C) (11/28 0800) Temp Source: Oral (11/28 0800) BP: 134/78 (11/28 1000) Pulse Rate: 86 (11/28 1000)  Labs: Recent Labs    11/05/17 1440  CREATININE 0.46    Estimated Creatinine Clearance: 57 mL/min (by C-G formula based on SCr of 0.46 mg/dL).   Medical History: Past Medical History:  Diagnosis Date  . Arthritis   . Asthma   . Atrial fibrillation ([place]) 04/14/2015  . COPD (chronic obstructive pulmonary disease) ([place])   . Coronary artery disease   . Diabetes mellitus without complication ([place])   . Emphysema of lung ([place])   . GERD (gastroesophageal reflux disease)   . Hypothyroid   . Irregular heart beat   . Myasthenia gravis [facility])     Assessment: 82 y/o F to transition from Lovenox to rivaroxaban for atrial fibrillation. Last dose of Lovenox was last PM.    Plan:  Will initiate rivaroxaban 20 mg daily starting at noon and then continuing daily with supper to bridge the gap between stopping Lovenox and starting rivaroxaban.   Ulice Dash D 11/06/2017,10:53 AM

## 2017-11-06 NOTE — Progress Notes (Signed)
Occupational Therapy Treatment Patient Details Name: Julia Crawford MRN: [id number] DOB: Apr 13, 1935 Today's Date: 11/06/2017    History of present illness Pt is an 82 yo F never smoker with history of DM II, hypothyroidism, myasthenia and asthma admitted via ED with 2-3 days of increasing SOB and diagnosed with acute on chronic hypercarbic respiratory failure due to asthma exacerbation. Initially required BiPAP in ED. Required intubation 10/21/17 for persistent bronchospasm, hypercarbia despite BiPAP.   Pt extubated 10/28/17 and tolerated well initially but developed progressive stridor and was re-intubated.  Pt noted to have severe subglottic stenosis upon re-intubation and trach tube placement performed 10/29/17.   OT comments  Pt seen for brief treatment session this date, limited by fatigue. Pt performed bilateral elbow flex/ext and shoulder flexion exercises x10, x5 respectively with encouragement to perform on her own during commercial breaks to support carryover outside of therapy. Pt agreeable. Pt declined additional therapy, reporting fatigue. HR 96-110, BP 112/62, O2 99-100%. Trach collar, all lines/leads in place at end of session. Will continue to progress.    Follow Up Recommendations  LTACH    Equipment Recommendations       Recommendations for Other Services      Precautions / Restrictions Precautions Precautions: Fall Restrictions Weight Bearing Restrictions: No Other Position/Activity Restrictions: HOB elevated >/= 30 deg       Mobility Bed Mobility               General bed mobility comments: bed in chair position  Transfers                      Balance                                           ADL either performed or assessed with clinical judgement   ADL Overall ADL's : Needs assistance/impaired Eating/Feeding: NPO Eating/Feeding Details (indicate cue type and reason): tracheostomy  Grooming: Wash/dry face;Set  up;Sitting Grooming Details (indicate cue type and reason): chair position for bed                                     Vision Patient Visual Report: No change from baseline     Perception     Praxis      Cognition Arousal/Alertness: Awake/alert Behavior During Therapy: WFL for tasks assessed/performed Overall Cognitive Status: Within Functional Limits for tasks assessed                                          Exercises General Exercises - Upper Extremity Shoulder Flexion: AROM;Both;5 reps;Standing;Strengthening Elbow Flexion: AROM;Strengthening;Both;10 reps;Seated Elbow Extension: AROM;Strengthening;Seated;Both;10 reps   Shoulder Instructions       General Comments      Pertinent Vitals/ Pain       Pain Assessment: No/denies pain  Home Living                                          Prior Functioning/Environment              Frequency  Min 2X/week  Progress Toward Goals  OT Goals(current goals can now be found in the care plan section)  Progress towards OT goals: OT to reassess next treatment  Acute Rehab OT Goals Patient Stated Goal: get stronger OT Goal Formulation: With patient Time For Goal Achievement: 11/18/17 Potential to Achieve Goals: Good  Plan Discharge plan remains appropriate;Frequency remains appropriate    Co-evaluation                 AM-PAC PT "6 Clicks" Daily Activity     Outcome Measure   Help from another person eating meals?: Total Help from another person taking care of personal grooming?: A Little Help from another person toileting, which includes using toliet, bedpan, or urinal?: Total Help from another person bathing (including washing, rinsing, drying)?: A Lot Help from another person to put on and taking off regular upper body clothing?: A Lot Help from another person to put on and taking off regular lower body clothing?: A Lot 6 Click Score: 11     End of Session    OT Visit Diagnosis: Other abnormalities of gait and mobility (R26.89);Muscle weakness (generalized) (M62.81)   Activity Tolerance Patient limited by fatigue   Patient Left in bed;with call bell/phone within reach;with bed alarm set   Nurse Communication          Time: 4469-5072 OT Time Calculation (min): 13 min  Charges: OT General Charges $OT Visit: 1 Visit OT Treatments $Therapeutic Exercise: 8-22 mins  Jeni Salles, MPH, MS, OTR/L ascom [phone] 11/06/17, 4:26 PM

## 2017-11-07 LAB — CBC
HEMATOCRIT: 33.5 % — AB (ref 35.0–47.0)
Hemoglobin: 11.3 g/dL — ABNORMAL LOW (ref 12.0–16.0)
MCH: 34.3 pg — ABNORMAL HIGH (ref 26.0–34.0)
MCHC: 33.6 g/dL (ref 32.0–36.0)
MCV: 102.1 fL — ABNORMAL HIGH (ref 80.0–100.0)
Platelets: 231 10*3/uL (ref 150–440)
RBC: 3.29 MIL/uL — AB (ref 3.80–5.20)
RDW: 16.1 % — ABNORMAL HIGH (ref 11.5–14.5)
WBC: 4.8 10*3/uL (ref 3.6–11.0)

## 2017-11-07 LAB — BASIC METABOLIC PANEL
Anion gap: 7 (ref 5–15)
BUN: 14 mg/dL (ref 6–20)
CHLORIDE: 101 mmol/L (ref 101–111)
CO2: 28 mmol/L (ref 22–32)
Calcium: 9.1 mg/dL (ref 8.9–10.3)
Creatinine, Ser: 0.34 mg/dL — ABNORMAL LOW (ref 0.44–1.00)
GFR calc Af Amer: >60 mL/min (ref >60)
Glucose, Bld: 113 mg/dL — ABNORMAL HIGH (ref 65–99)
POTASSIUM: 3.4 mmol/L — AB (ref 3.5–5.1)
SODIUM: 136 mmol/L (ref 135–145)

## 2017-11-07 LAB — GLUCOSE, CAPILLARY
Glucose-Capillary: 131 mg/dL — ABNORMAL HIGH (ref 65–99)
Glucose-Capillary: 173 mg/dL — ABNORMAL HIGH (ref 65–99)
Glucose-Capillary: 174 mg/dL — ABNORMAL HIGH (ref 65–99)
Glucose-Capillary: 211 mg/dL — ABNORMAL HIGH (ref 65–99)
Glucose-Capillary: 80 mg/dL (ref 65–99)
Glucose-Capillary: 91 mg/dL (ref 65–99)

## 2017-11-07 MED ORDER — POTASSIUM CHLORIDE 20 MEQ/15ML (10%) PO SOLN
40.0000 meq | Freq: Two times a day (BID) | ORAL | Status: AC
  Administered 2017-11-07 (×2): 40 meq
  Filled 2017-11-07 (×2): qty 30

## 2017-11-07 MED ORDER — FAMOTIDINE 20 MG PO TABS
20.0000 mg | ORAL_TABLET | Freq: Every day | ORAL | Status: DC
  Administered 2017-11-07 – 2017-11-08 (×3): 20 mg
  Filled 2017-11-07 (×2): qty 1

## 2017-11-07 MED ORDER — DEXTROSE 5 % IV SOLN
2.0000 g | Freq: Three times a day (TID) | INTRAVENOUS | Status: DC
  Filled 2017-11-07 (×3): qty 2

## 2017-11-07 MED ORDER — TRAZODONE HCL 50 MG PO TABS: 50.0000 mg | ORAL_TABLET | Freq: Every evening | ORAL | Status: DC | PRN

## 2017-11-07 MED ORDER — CEFTAZIDIME SODIUM IN D5W 1-5 GM/50ML-% IV SOLN: 1.0000 g | Freq: Three times a day (TID) | INTRAVENOUS | Status: DC

## 2017-11-07 MED ORDER — CEFTAZIDIME SODIUM IN D5W 2-5 GM/50ML-% IV SOLN
2.0000 g | Freq: Three times a day (TID) | INTRAVENOUS | Status: DC
  Administered 2017-11-07 (×2): 2 g via INTRAVENOUS
  Filled 2017-11-07 (×4): qty 50

## 2017-11-07 MED ORDER — LEVOTHYROXINE SODIUM 100 MCG PO TABS
100.0000 ug | ORAL_TABLET | ORAL | Status: DC
  Administered 2017-11-07 – 2017-11-08 (×2): 100 ug
  Filled 2017-11-07 (×2): qty 1

## 2017-11-07 NOTE — Progress Notes (Signed)
Nutrition Follow-up  DOCUMENTATION CODES:   Obesity unspecified  INTERVENTION:  Continue Vital AF 1.2 at 65 mL/hr (1560 mL goal daily volume). Provides 1872 kcal, 117 grams of protein, 1264 mL H2O daily.  Recommend utilizing PEPuP protocol to reduce accumulation of protein and calorie deficits.  With current free water flush of 100 mL Q8hrs patient is receiving a total of 1564 mL H2O including water in tube feeds.  NUTRITION DIAGNOSIS:   Inadequate oral intake related to inability to eat as evidenced by NPO status.  Ongoing - addressing with TF regimen.  GOAL:   Provide needs based on ASPEN/SCCM guidelines  Not met - patient receiving <80% goal tube feed rates the past two days as it is being held when patient moves. Recommend addressing with implementation of PEPuP protocol after tube feeds are able to be restarted.  MONITOR:   Vent status, Labs, Weight trends, TF tolerance, I & O's  REASON FOR ASSESSMENT:   Ventilator, Consult Enteral/tube feeding initiation and management  ASSESSMENT:   82 year old female with PMHx of CAD, COPD, arthritis, CHF, DM type 2, myasthenia gravis, hypothyroidism, A-fib, GERD, emphysema of lung, hx of partial hysterectomy who presented with shortness of breath who presented with acute on chronic respiratory failure with hypoxia and hypercapnia and required emergent intubation on 11/12 after failing BiPAP.  -Patient has now been tolerating tracheostomy collar since 11/28 at 0751 per oxygen therapy flowsheet. Currently on O2 Flow Rate of 10 L/min with FiO2 of 35%. -Patient's insurance has approved LTAC with [facility]. Awaiting bed availability.  Patient was sleeping at time of RD assessment this morning. Per RN she is still tolerating her goal rate of tube feeds well.  Access: 10 French NGT with weighted Dobbhoff tip placed 11/04/2017; terminates in mid stomach per abdominal x-ray 11/26; 55 cm at left nare  MAP: 72-109 mmHg; patient  remains hemodynamically stable  TF: patient tolerating Vital AF 1.2 at 65 mL/hr; ordered for 100 mL free water flush Q8hrs; per pump history patient received 1237 mL tube feeds in the past 24 hours (79% goal tube feed volume) and the day before the patient only received 69% goal tube feed volume  Medications reviewed and include: famotidine, Novolog 0-15 units Q4hrs (received 18 units past 24 hrs), Lantus 10 units, levothyroxine, potassium chloride 40 mEq BID, Senokot.  Labs reviewed: CBG 80-214 past 24 hrs, Potassium 3.4, Creatinine 0.34.  I/O: 4 occurrences unmeasured UOP yesterday, 2 occurrences BM  Weight trend: 93.4 kg on 11/29; +2.5 kg from admission  Discussed with RN.  Diet Order:  No diet orders on file  EDUCATION NEEDS:   No education needs have been identified at this time  Skin:  Skin Assessment: Skin Integrity Issues: Skin Integrity Issues:: Stage III Stage III: full-thickness device related pressure injury to trach site (2cm x 2cm x 0.3cm)  Last BM:  11/06/2017 - small type 7  Height:   Ht Readings from Last 1 Encounters:  10/20/17 5' (1.524 m)    Weight:   Wt Readings from Last 1 Encounters:  11/07/17 205 lb 14.6 oz (93.4 kg)    Ideal Body Weight:  45.5 kg  BMI:  Body mass index is 40.21 kg/m.  Estimated Nutritional Needs:   Kcal:  1700-1960 (MSJ x 1.3-1.5)  Protein:  110-120 grams (1.1-1.3 grams/kg; approximately 2.4-2.6 grams/kg IBW)  Fluid:  1.4-1.6 L/day (30-35 mL/kg IBW)  Willey Blade, MS, RD, LDN Office: [phone] Pager: [phone] After Hours/Weekend Pager: [phone]

## 2017-11-07 NOTE — Progress Notes (Signed)
[place] [facility] [place] NAME: Julia Crawford    MR#:  [id number]  DATE OF BIRTH:  13-Sep-1935  SUBJECTIVE:  CHIEF COMPLAINT:   Chief Complaint  Patient presents with  . Respiratory Distress  doing well, no complaints REVIEW OF SYSTEMS:   Review of Systems  Constitutional: Positive for malaise/fatigue. Negative for chills and fever.  HENT: Negative for sore throat.   Eyes: Negative for blurred vision, double vision and pain.  Respiratory: Positive for cough and shortness of breath. Negative for hemoptysis and wheezing.   Cardiovascular: Positive for leg swelling. Negative for chest pain, palpitations and orthopnea.  Gastrointestinal: Negative for abdominal pain, constipation, diarrhea, heartburn, nausea and vomiting.  Genitourinary: Negative for dysuria and hematuria.  Musculoskeletal: Negative for back pain and joint pain.  Skin: Negative for rash.  Neurological: Positive for weakness. Negative for sensory change, speech change, focal weakness and headaches.  Endo/Heme/Allergies: Does not bruise/bleed easily.  Psychiatric/Behavioral: Negative for depression. The patient is not nervous/anxious.    DRUG ALLERGIES:   Allergies  Allergen Reactions  . Ace Inhibitors Cough    Other reaction(s): Unknown  . Apixaban     Other reaction(s): Unknown  . Azithromycin     Myasthenia Gravis Patient  . Butylene Glycol Other (See Comments)  . Codeine Itching and Swelling  . Doxycycline   . Lasix [Furosemide] Other (See Comments)    Chest and back pain  . Peanut Butter Flavor     rash?  . Telithromycin     Other reaction(s): Other (See Comments) Myasthenia Gravis Patient    VITALS:  Blood pressure (!) 121/96, pulse (!) 104, temperature 98.2 F (36.8 C), temperature source Oral, resp. rate (!) 33, height 5' (1.524 m), weight 93.4 kg (205 lb 14.6 oz), SpO2 97 %.  PHYSICAL EXAMINATION:  GENERAL:  82 y.o.-year-old patient lying in the bed.  EYES:  Pupils equal, round, reactive to light and accommodation. No scleral icterus. Extraocular muscles intact.  HEENT: Head atraumatic, normocephalic. Oropharynx and nasopharynx clear. Trach in place,dressing intact NECK:  Supple, no jugular venous distention. No thyroid enlargement, no tenderness.  LUNGS: Normal breath sounds bilaterally, wheezing, no crepitation. Trach CARDIOVASCULAR: S1, S2 normal. No murmurs, rubs, or gallops.  ABDOMEN: Soft, nontender, nondistended. Bowel sounds present. No organomegaly or mass.  EXTREMITIES: No pedal edema, cyanosis, or clubbing.  NEUROLOGIC: sedated SKIN: No obvious rash, lesion, or ulcer.   Physical Exam LABORATORY PANEL:   CBC Recent Labs  Lab 11/07/17 0604  WBC 4.8  HGB 11.3*  HCT 33.5*  PLT 231   ------------------------------------------------------------------------------------------------------------------  Chemistries  Recent Labs  Lab 11/05/17 1440 11/07/17 0604  NA 134* 136  K 3.7 3.4*  CL 98* 101  CO2 24 28  GLUCOSE 239* 113*  BUN 13 14  CREATININE 0.46 0.34*  CALCIUM 8.8* 9.1  MG 1.6*  --    ------------------------------------------------------------------------------------------------------------------  Cardiac Enzymes No results for input(s): TROPONINI in the last 168 hours. ------------------------------------------------------------------------------------------------------------------  RADIOLOGY:  No results found.  ASSESSMENT AND PLAN:   Active Problems:   Acute on chronic respiratory failure with hypercapnia (HCC)   Dyspnea   Pressure injury of skin  1.  Respiratory failure: Acute on chronic with hypoxia and hypercapnia.   - Status asthamaticus, tracheobronchitis Extubated on 10/28/2017, but reintubated due to subglottic stenosis - Tracheostomy 11/21, dressing changed by ENT, continue Rocephin Transfer to LTAC when approved from insurance - on trach collar, Tube feeds through DBT 2.  CAD: Stable;  continue aspirin 3.  Atrial fibrillation: Rate controlled; continue diltiazem 4.  CHF: Systolic; chronic 5.  Diabetes mellitus type 2: keep on ISS. 6. Hyperlipidemia: Continue statin therapy 7.  Myasthenia gravis: Stable; continue pyridostigmine 8.  Depression: Continue sertraline 9.  DVT prophylaxis: Lovenox     CODE STATUS: DNR  TOTAL TIME TAKING CARE OF THIS PATIENT: 15 minutes.   Max Sane M.D on 11/07/2017   Between 7am to 6pm - Pager - [phone]  After 6pm go to [email or web address] - password [email or web address] [facility]  Office  [phone]  CC: Primary care physician; Jerrol Banana., MD  Note: This dictation was prepared with Dragon dictation along with smaller phrase technology. Any transcriptional errors that result from this process are unintentional.

## 2017-11-07 NOTE — Progress Notes (Signed)
PULMONARY / CRITICAL CARE MEDICINE   Name: EDLA PARA MRN: [id number] DOB: 18-Jan-1935    ADMISSION DATE:  10/20/2017  PT PROFILE:   40 F never smoker with history of DM II, hypothyroidism, myasthenia and asthma admitted via ED with 2-3 days of increasing SOB. Initially required BiPAP in ED. Required intubation 11/12 for persistent bronchospasm, hypercarbia despite BiPAP  MAJOR EVENTS/TEST RESULTS: 11/11 Admitted with dx of acute/chronic hypercarbic respiratory failure due to asthma exacerbation 11/12 intubated 11/13 very tight bronchospasm with very prolonged expiratory time and very high PIPs with pt-vent dyssynchrony requiring NMB.  11/13 developed AF with very slow vent rate. Made DNR in event of cardiac arrest 11/14 Severe but improved bronchospasm. Able to F/C.  11/15 persistent severe bronchospasm.  Tolerated PSV mode only briefly.  Cognition intact on WUA.  Increased airway secretions 11/18 failed SBT 11/19 Passed SBT and extubated. Tolerated well initially but developed progressive stridor throughout day and re-intubated. Noted to have severe subglottic stenosis upon re-intubation 11/20 Family agreed to proceed with trach tube placement. ENT consulted 11/21 Tracheostomy tube placement 11/22 - 11/25 Weaning on PSV <> ATC. Little progress 11/24 Cefazolin initiated for trach tube site infection 11/26 Purulent secretions. Not tolerating PSV. abx expanded to ceftriaxone 11/28 tolerating ATC.  Cognition intact 11/29 has remained on ATC >24 hours  INDWELLING DEVICES:: ETT 11/12 >> 11/19, 11/19 >> 11/21 L IJ CVL 11/12 >> 11/26 Trach tube 11/21 >>    MICRO DATA: MRSA PCR 11/11 >> NEG Resp 11/12 >> NOF Blood 11/11 >> NEG Resp 11/15 >> Klebsiella pneumoniae Resp 11/24 >> mult organisms, none predominant  Resp 11/26 >> Pseudomonas  ANTIMICROBIALS:  Ceftriaxone 11/15 >> 11/21, 11/26 >> 11/29 Cefazolin 11/24 >> 11/26   Ceftazidime 11/29 >> (5 days planned)   SUBJECTIVE:   No new complaints.  NAD   VITAL SIGNS: BP (!) 121/96   Pulse (!) 104   Temp 98.2 F (36.8 C) (Oral)   Resp (!) 33   Ht 5' (1.524 m)   Wt 93.4 kg (205 lb 14.6 oz)   SpO2 99%   BMI 40.21 kg/m   HEMODYNAMICS:    VENTILATOR SETTINGS: FiO2 (%):  [35 %-40 %] 35 %  INTAKE / OUTPUT: I/O last 3 completed shifts: In: 2490 [NG/GT:2440; IV Piggyback:50] Out: 1 [Urine:1]  PHYSICAL EXAMINATION: General: RASS 0.  + F/C Neuro: CNs intact, MAEs HEENT: NCAT, sclerae white Cardiovascular: reg, no M Lungs: no wheezes, few rhonchi Abdomen: obese, soft, diminished BS Ext: warm, no edema Skin: no lesions noted  LABS:  BMET Recent Labs  Lab 11/03/17 0439 11/05/17 1440 11/07/17 0604  NA  --  134* 136  K  --  3.7 3.4*  CL  --  98* 101  CO2  --  24 28  BUN  --  13 14  CREATININE 0.39* 0.46 0.34*  GLUCOSE  --  239* 113*    Electrolytes Recent Labs  Lab 11/05/17 1440 11/07/17 0604  CALCIUM 8.8* 9.1  MG 1.6*  --     CBC Recent Labs  Lab 11/03/17 0439 11/07/17 0604  WBC 5.9 4.8  HGB 10.3* 11.3*  HCT 30.2* 33.5*  PLT 252 231    Coag's No results for input(s): APTT, INR in the last 168 hours.  Sepsis Markers No results for input(s): LATICACIDVEN, PROCALCITON, O2SATVEN in the last 168 hours.  ABG No results for input(s): PHART, PCO2ART, PO2ART in the last 168 hours.  Liver Enzymes No results for input(s): AST, ALT, ALKPHOS,  BILITOT, ALBUMIN in the last 168 hours.  Cardiac Enzymes No results for input(s): TROPONINI, PROBNP in the last 168 hours.  Glucose Recent Labs  Lab 11/06/17 1620 11/06/17 1928 11/06/17 2348 11/07/17 0351 11/07/17 0807 11/07/17 1155  GLUCAP 182* 168* 121* 131* 80 174*    CXR: No new film   ASSESSMENT / PLAN:  PULMONARY A: Acute/chronic respiratory failure Status asthmaticus, resolved Prolonged ventilator dependence Subglottic stenosis Tracheostomy status P:   Cont vent support - settings reviewed and/or adjusted PSV  > ATC as tolerated Cont vent bundle SLP eval for possible Passy-Muir valve  CARDIOVASCULAR A:  PAF - rate controlled P:  Monitor per ICU protocol Continue rivaroxaban   RENAL A:   Mild hypokalemia P:   Monitor BMET intermittently Monitor I/Os Correct electrolytes as indicated   GASTROINTESTINAL A:   Obesity P:   SUP: enteral famotidine  Cont TF protocol  HEMATOLOGIC A:   Macrocytic anemia - normal serum vitamin B12, RBC folate P:  DVT px: Rivaroxaban Monitor CBC intermittently Transfuse per usual guidelines   INFECTIOUS A:   Klebsiella tracheobronchitis - treated Pseudomonas tracheobronchitis P:   Monitor temp, WBC count Micro and abx as above   ENDOCRINE A:   Type II DM - controlled P:   Continue moderate scale SSI  Continue Lantus  NEUROLOGIC A:   ICU/vent associated discomfort Myasthenia gravis Deconditioning P:   RASS goal: 0 Continue pyridostigmine PT/OT following Advance activity as able Discharge planning - Seeking LTAC  FAMILY: No family at bedside this a.m.   Merton Border, MD PCCM service Mobile [phone] Pager [phone] 11/07/2017, 2:42 PM

## 2017-11-07 NOTE — Discharge Summary (Signed)
Physician Discharge Summary  Patient ID: Julia Crawford MRN: [id number] DOB/AGE: Jul 01, 1935 82 y.o.  Admit date: 10/20/2017 Discharge date: 10/14/2017  Admission Diagnoses: Acute hypercarbic respiratory failure Status asthmaticus Subglottic stenosis Afib with RVR Macrocytic anemia Myasthenia gravis  Discharge Diagnoses:  Acute hypercarbic respiratory failure s/p tracheostomy Status asthmaticus Subglottic stenosis Paroxysmal Afib-rate controlled Klebsiella tracheobronchitis Pseudomonas pneumonia Myasthenia gravis  Active Problems:   Acute on chronic respiratory failure with hypercapnia (HCC)   Dyspnea   Pressure injury of skin   Discharged Condition: stable  Hospital Course:  11/11 Admitted with dx of acute/chronic hypercarbic respiratory failure due to asthma exacerbation 11/12 intubated 11/13 very tight bronchospasm with very prolonged expiratory time and very high PIPs with pt-vent dyssynchrony requiring NMB.  11/13 developed AF with very slow vent rate. Made DNR in event of cardiac arrest 11/14 Severe but improved bronchospasm. Able to F/C.  11/15 persistent severe bronchospasm.  Tolerated PSV mode only briefly.  Cognition intact on WUA.  Increased airway secretions 11/18 failed SBT 11/19 Passed SBT and extubated. Tolerated well initially but developed progressive stridor throughout day and re-intubated. Noted to have severe subglottic stenosis upon re-intubation 11/20 Family agreed to proceed with trach tube placement. ENT consulted 11/21 Tracheostomy tube placement 11/22 - 11/25 Weaning on PSV <> ATC. Little progress 11/24 Cefazolin initiated for trach tube site infection 11/26 Purulent secretions. Not tolerating PSV. abx expanded to ceftriaxone 11/28 tolerating ATC.  Cognition intact 11/29 On TC at 3:55 11/30 Stable on continuous trach collar   Consults: pulmonary/intensive care and ENT for trach placement  Significant Diagnostic Studies: microbiology: blood  culture: negative, sputum culture: positive for PSEUDOMONAS AERUGINOSA  and urine culture: negative and radiology: CXR: NORMAL WITH NO ACUTE INFILTRATE  VENTILATOR SETTINGS: On Trach collar   Discharge Exam: Blood pressure 101/69, pulse 84, temperature 98.1 F (36.7 C), temperature source Oral, resp. rate (!) 39, height 5' (1.524 m), weight 200 lb 9.9 oz (91 kg), SpO2 99 %.  General: RASS 0.  + F/C Neuro: CNs intact, MAEs HEENT: NCAT, sclerae white Cardiovascular: reg, no M Lungs: no wheezes, few rhonchi Abdomen: obese, soft, diminished BS Ext: warm, no edema Skin: no lesions noted Trach in place   Allergies as of 10/19/2017      Reactions   Ace Inhibitors Cough   Other reaction(s): Unknown   Apixaban    Other reaction(s): Unknown   Azithromycin    Myasthenia Gravis Patient   Butylene Glycol Other (See Comments)   Codeine Itching, Swelling   Doxycycline    Lasix [furosemide] Other (See Comments)   Chest and back pain   Peanut Butter Flavor    rash?   Telithromycin    Other reaction(s): Other (See Comments) Myasthenia Gravis Patient      Medication List    STOP taking these medications   albuterol 108 (90 Base) MCG/ACT inhaler Commonly known as:  PROVENTIL HFA;VENTOLIN HFA   aspirin 81 MG tablet Replaced by:  aspirin 81 MG chewable tablet   azaTHIOprine 50 MG tablet Commonly known as:  IMURAN   BREO ELLIPTA 100-25 MCG/INH Aepb Generic drug:  fluticasone furoate-vilanterol   diltiazem 120 MG 24 hr capsule Commonly known as:  CARDIZEM CD   furosemide 20 MG tablet Commonly known as:  LASIX   metFORMIN 500 MG tablet Commonly known as:  GLUCOPHAGE   metoCLOPramide 10 MG tablet Commonly known as:  REGLAN   NON FORMULARY   SPIRIVA HANDIHALER 18 MCG inhalation capsule Generic drug:  tiotropium  TAKE these medications   aspirin 81 MG chewable tablet Place 1 tablet (81 mg total) into feeding tube daily. Replaces:  aspirin 81 MG tablet   bisacodyl  10 MG suppository Commonly known as:  DULCOLAX Place 1 suppository (10 mg total) rectally daily as needed for moderate constipation.   budesonide 0.25 MG/2ML nebulizer solution Commonly known as:  PULMICORT Take 2 mLs (0.25 mg total) by nebulization every 6 (six) hours.   ceftAZIDime 2-5 GM/50ML-% Commonly known as:  FORTAZ Inject 50 mLs (2 g total) into the vein every 8 (eight) hours.   ceftAZIDime 2-5 GM/50ML-% Commonly known as:  FORTAZ Inject 50 mLs (2 g total) into the vein every 8 (eight) hours.   chlorhexidine gluconate (MEDLINE KIT) 0.12 % solution Commonly known as:  PERIDEX 15 mLs by Mouth Rinse route 2 (two) times daily.   cyanocobalamin 1000 MCG tablet Place 1 tablet (1,000 mcg total) into feeding tube daily. What changed:  how to take this   diltiazem 10 mg/ml oral suspension Commonly known as:  CARDIZEM Place 3 mLs (30 mg total) into feeding tube every 8 (eight) hours.   famotidine 20 MG tablet Commonly known as:  PEPCID Place 1 tablet (20 mg total) into feeding tube daily.   feeding supplement (VITAL AF 1.2 CAL) Liqd Place 1,000 mLs into feeding tube continuous.   free water Soln Place 100 mLs into feeding tube every 8 (eight) hours.   insulin aspart 100 UNIT/ML injection Commonly known as:  novoLOG Inject 0-15 Units into the skin every 4 (four) hours.   insulin glargine 100 UNIT/ML injection Commonly known as:  LANTUS Inject 0.1 mLs (10 Units total) into the skin daily.   ipratropium 0.02 % nebulizer solution Commonly known as:  ATROVENT Take 2.5 mLs (0.5 mg total) by nebulization every 6 (six) hours.   levalbuterol 0.63 MG/3ML nebulizer solution Commonly known as:  XOPENEX Take 3 mLs (0.63 mg total) by nebulization every 3 (three) hours as needed for wheezing or shortness of breath.   levalbuterol 0.63 MG/3ML nebulizer solution Commonly known as:  XOPENEX Take 3 mLs (0.63 mg total) by nebulization every 6 (six) hours.   levothyroxine 100 MCG  tablet Commonly known as:  SYNTHROID, LEVOTHROID Place 1 tablet (100 mcg total) into feeding tube every morning. What changed:    how to take this  when to take this   neomycin-bacitracin-polymyxin ointment Commonly known as:  NEOSPORIN Apply topically 2 (two) times daily. apply to eye   ondansetron 4 MG/2ML Soln injection Commonly known as:  ZOFRAN Inject 2 mLs (4 mg total) into the vein every 6 (six) hours as needed for nausea.   pyridostigmine 60 MG tablet Commonly known as:  MESTINON Place 1 tablet (60 mg total) into feeding tube every 8 (eight) hours. What changed:    how to take this  when to take this   rivaroxaban 20 MG Tabs tablet Commonly known as:  XARELTO Take 1 tablet (20 mg total) by mouth daily with supper.   sennosides 8.8 MG/5ML syrup Commonly known as:  SENOKOT Place 5 mLs into feeding tube 2 (two) times daily.   sertraline 25 MG tablet Commonly known as:  ZOLOFT Place 1 tablet (25 mg total) into feeding tube daily. What changed:  how to take this   simvastatin 10 MG tablet Commonly known as:  ZOCOR Place 1 tablet (10 mg total) into feeding tube daily at 6 PM. What changed:    how to take this  when to  take this   traZODone 50 MG tablet Commonly known as:  DESYREL Place 1 tablet (50 mg total) into feeding tube at bedtime as needed for sleep.       Current Facility-Administered Medications  Medication Dose Route Frequency Provider Last Rate Last Dose  . acetaminophen (TYLENOL) tablet 650 mg  650 mg Per Tube Q6H PRN Wilhelmina Mcardle, MD   650 mg at 11/06/17 1935  . aspirin chewable tablet 81 mg  81 mg Per Tube Daily Nettie Elm, MD      . bisacodyl (DULCOLAX) suppository 10 mg  10 mg Rectal Daily PRN Wilhelmina Mcardle, MD   10 mg at 11/01/17 1904  . budesonide (PULMICORT) nebulizer solution 0.25 mg  0.25 mg Nebulization Q6H Wilhelmina Mcardle, MD   0.25 mg at 10/29/2017 1322  . ceftAZIDime (FORTAZ) IVPB 2 g  2 g Intravenous Q8H Nettie Elm, MD       . chlorhexidine gluconate (MEDLINE KIT) (PERIDEX) 0.12 % solution 15 mL  15 mL Mouth Rinse BID Awilda Bill, NP   15 mL at 10/26/2017 0802  . diltiazem (CARDIZEM) 10 mg/ml oral suspension 30 mg  30 mg Per Tube Judye Bos, MD      . famotidine (PEPCID) tablet 20 mg  20 mg Per Tube Daily Wilhelmina Mcardle, MD   20 mg at 11/05/2017 1025  . feeding supplement (VITAL AF 1.2 CAL) liquid 1,000 mL  1,000 mL Per Tube Continuous Wilhelmina Mcardle, MD 65 mL/hr at 10/16/2017 1218 1,000 mL at 11/07/2017 1218  . free water 100 mL  100 mL Per Tube Q8H Wilhelmina Mcardle, MD   100 mL at 10/26/2017 0554  . insulin aspart (novoLOG) injection 0-15 Units  0-15 Units Subcutaneous Q4H Wilhelmina Mcardle, MD   3 Units at 11/05/2017 1141  . insulin glargine (LANTUS) injection 10 Units  10 Units Subcutaneous Daily Wilhelmina Mcardle, MD   10 Units at 11/07/17 2357  . ipratropium (ATROVENT) nebulizer solution 0.5 mg  0.5 mg Nebulization Q6H Wilhelmina Mcardle, MD   0.5 mg at 11/06/2017 1322  . levalbuterol (XOPENEX) nebulizer solution 0.63 mg  0.63 mg Nebulization Q3H PRN Wilhelmina Mcardle, MD   0.63 mg at 10/29/17 0115  . levalbuterol (XOPENEX) nebulizer solution 0.63 mg  0.63 mg Nebulization Q6H Sudini, Alveta Heimlich, MD   0.63 mg at 11/04/2017 1322  . levothyroxine (SYNTHROID, LEVOTHROID) tablet 100 mcg  100 mcg Per Tube Benson Setting, MD   100 mcg at 10/12/2017 0754  . MEDLINE mouth rinse  15 mL Mouth Rinse 10 times per day Awilda Bill, NP   15 mL at 10/11/2017 1026  . neomycin-bacitracin-polymyxin (NEOSPORIN) ointment   Topical BID Wilhelmina Mcardle, MD   1 application at 22/02/54 1141  . ondansetron (ZOFRAN) injection 4 mg  4 mg Intravenous Q6H PRN Harrie Foreman, MD      . pyridostigmine (MESTINON) tablet 60 mg  60 mg Per Tube Q8H Wilhelmina Mcardle, MD   60 mg at 10/20/2017 0554  . rivaroxaban (XARELTO) tablet 20 mg  20 mg Oral Q supper Napoleon Form, RPH   20 mg at 11/07/17 1531  . sennosides (SENOKOT) 8.8 MG/5ML syrup 5  mL  5 mL Per Tube BID Wilhelmina Mcardle, MD   5 mL at 11/07/17 2357  . sertraline (ZOLOFT) tablet 25 mg  25 mg Per Tube Daily Nettie Elm, MD      . simvastatin [facility])  tablet 10 mg  10 mg Per Tube q1800 Nettie Elm, MD      . sodium chloride flush (NS) 0.9 % injection 10-40 mL  10-40 mL Intracatheter Q12H Wilhelmina Mcardle, MD   10 mL at 10/30/2017 1026  . sodium chloride flush (NS) 0.9 % injection 10-40 mL  10-40 mL Intracatheter PRN Wilhelmina Mcardle, MD      . traZODone (DESYREL) tablet 50 mg  50 mg Per Tube QHS PRN Wilhelmina Mcardle, MD      . vitamin B-12 (CYANOCOBALAMIN) tablet 1,000 mcg  1,000 mcg Per Tube Daily Nettie Elm, MD       INDWELLING DEVICES:: ETT 11/12 >> 11/19, 11/19 >> 11/21 L IJ CVL 11/12 >> 11/26 Trach tube 11/21 >>   MICRO DATA: MRSA PCR 11/11 >> NEG Resp 11/12 >> NOF Blood 11/11 >> NEG Resp 11/15 >> Klebsiella pneumoniae Resp 11/24 >> mult organisms, none predominant  Resp 11/26 >> Pseudomonas   ANTIMICROBIALS:  Ceftriaxone 11/15 >> 11/29 Cefazolin 11/24 >> 11/26   Ceftazidime 11/29> (continue for total 14 days)  DISCHARGE PLAN   56 F never smoker with history of DM II, hypothyroidism, myasthenia and asthma admitted via [place] SOB. Initially required BiPAP in ED. Required intubation 11/12 for persistent bronchospasm, hypercarbia.  Extubated 11/19 but was reintubated for severe subglottic stenosis. S/P tract 10/30/17.   Problem list:  Respiratory failure requiring tracheostomy Sub glottic stenosis CAD afib CHF DM Dyslipidemia Myasthenia gravis Hypothyroidism Asthma with exacerbation - exacerbation resolved. Klebsiella PNA 10/24/17, Pseudomonas PNA 11/04/17. Macrocytic anemia - normal serum vitamin B12, RBC folate  Has been on TC since yesterday and is tolerating it well. Continue on trach collar as tolerated. Continue xopenex and atrovent Q6h Continune steroid nebs. Continue ceftazidime for pseudomonas pneumonia . Total duration of  abx should be 14 days.  Patient found to have hypomagnesemia and hypophosphatemia. These were likely causing PVCs today. Electrolytes replaced.  Continue xeralto. Re-starting home cardizem for afib but given her SPB in the low 100s, will decrease the dose to cardizem 30 mg Q8h. This can be increased back to home dose of cardizem if her BP remains stable.  Re-start home ASA. Holding home lasix given the above as well but it can be re-started once the patient's BP remains stable after starting cardizem.   On pyridostigmine for myasthenia gravis. She is on asathioprine at home (used for myasthenia gravis). We have not sterted it as it cannot be crushed. Consider starting azathioprine when the patient is able to tolerate PO.  Continue home zoloft  Continue home statins  Continue synthroid  Insulin SS and lantus for glycemic control. Holding home glucophage  Pepcid for SUP.  Transfer to [facility] later today     Disposition: LTAC  Marda Stalker, [place] Pager [phone] (please enter 7 digits) [place] Pager [phone] (please enter 7 digits)   This discharge summary was originally written by Marda Stalker, NP and has been modified to reflect the events and medication changes since this morning.  Nettie Elm, MD Pulmonary and Critical Care Medicine

## 2017-11-07 NOTE — Evaluation (Signed)
[facility] Speaking Valve - Evaluation Patient Details  Name: Julia Crawford MRN: [id number] Date of Birth: 1935-11-09  Today's Date: 11/07/2017 Time: 1500-1600 SLP Time Calculation (min) (ACUTE ONLY): 60 min  Past Medical History:  Past Medical History:  Diagnosis Date  . Arthritis   . Asthma   . Atrial fibrillation ([place]) 04/14/2015  . COPD (chronic obstructive pulmonary disease) ([place])   . Coronary artery disease   . Diabetes mellitus without complication ([place])   . Emphysema of lung ([place])   . GERD (gastroesophageal reflux disease)   . Hypothyroid   . Irregular heart beat   . Myasthenia gravis [facility])    Past Surgical History:  Past Surgical History:  Procedure Laterality Date  . BREAST BIOPSY    . CATARACT EXTRACTION    . PARTIAL HYSTERECTOMY    . TONSILLECTOMY    . TRACHEOSTOMY TUBE PLACEMENT N/A 10/30/2017   Procedure: TRACHEOSTOMY;  Surgeon: Clyde Canterbury, MD;  Location: [facility] ORS;  Service: ENT;  Laterality: N/A;   HPI:  Pt is an 82 yo F never smoker with history of DM II, hypothyroidism, myasthenia and asthma admitted via ED with 2-3 days of increasing SOB and diagnosed with acute on chronic hypercarbic respiratory failure due to asthma exacerbation. Initially required BiPAP in ED. Required intubation 10/21/17 for persistent bronchospasm, hypercarbia despite BiPAP.   Pt extubated 10/28/17 and tolerated well initially but developed progressive stridor and was re-intubated.  Pt noted to have severe subglottic stenosis upon re-intubation and trach tube placement performed 10/29/17.  Pt had NG tube, however coughed up NG tube but now this has been replaced. Pt would like to eat/drink by mouth again.    Assessment / Plan / Recommendation Clinical Impression  Pt seen for clinical evaluation of PMV tolertion; use/wear. Pt alert, cooperative and engaged; able to follow commands. Pt has a Shiley #6 trach; has tolerated trach collar wean for ~24 hours now; trach collar weaning has been  inconsistent for pt and she has required vent support intermittently. Cuff deflation trials were initiated at this eval d/t cuff being inflated; this has been intermittent w/ cuff inflated at times post transition from vent. Discussed w/ NSG the need to have cuff remain deflated if pt is able to tolerate in order to increase pharyngeal-laryngeal sensation; NSG will discuss w/ MD/RT. Pt exhibited coughing w/ cuff was slowly deflated; rest breaks and time given for pt to adjust to any airflow and/or laryngeal secretions. Pt was able to cough but appeared to clear secretions at the level of the trach, not orally. Pt was able to tolerate full cuff deflation for the remainder of the assessment; cuff left deflated w/ NSG updated. O2 sats remained 98%, RR mid20's, HR 103. PMV placement was attempted d/t diffculty timing phonation w/ finger occlusion(though minimal voicing was appreciated). PMV placed 30-45 seconds x3 trials. Pt was able to redirect airflow superiorly w/ brief phonation but noted some airflow at the level of the STOMA w/ exertion and secretion noise as well. Vocal quality at word level was hoase, low in volume, Gravely, Strained, and dysphonic. Breath support for phonation/speech at word level was decreased. Due to pt's apparent discomfort, PMV was removed w/ increased airflow noteed at the level of the trach; pt endorsed it was "easier" to breath w/ the PMV removed. Noted min increased RR(38) during PMV placement but no desaturation or significant HR changes. As pt appears to be unable to comfortably redirect airflow through the glottis superiorly for breathing and speech, this SLP  does NOT recommend PMV use/placement at this time. Recommend f/u w/ ENT for direct viewing of glottal area/larynx/vocal cords to r/o any edema or obstruction b/f further PMV trials/tx. NSG updated. Pt informed of recommendations, agreed.   SLP Visit Diagnosis: Aphonia (R49.1)(tracheostomy)    SLP Assessment  Patient needs  continued Speech Lanaguage Pathology Services(but needs ENT f/u for further assessment first)    Follow Up Recommendations  Inpatient Rehab    Frequency and Duration (TBD)  (TBD)    PMSV Trial PMSV was placed for: 30-45 seconds x3 trials Able to redirect subglottic air through upper airway: Yes(but noted some airflow at the level of the STOMA as well) Able to Attain Phonation: Yes Voice Quality: Hoarse;Low vocal intensity(Gravely; Strained; Dysphonic) Able to Expectorate Secretions: No Level of Secretion Expectoration with PMSV: Tracheal Breath Support for Phonation: Mildly decreased Intelligibility: Intelligibility reduced Word: 0-24% accurate Respirations During Trial: (!) 38 SpO2 During Trial: 98 % Pulse During Trial: 105 Behavior: Alert;Cooperative;Expresses self well;Good eye contact;Responsive to questions   Tracheostomy Tube  Additional Tracheostomy Tube Assessment Fenestrated: No Trach Collar Period: has tolerated trach collar wean for ~24 hours now; trach collar weaning has been inconsistent for pt and she has required vent support intermittently Secretion Description: Purulent secretions Frequency of Tracheal Suctioning: PRN by NSG Level of Secretion Expectoration: Tracheal    Vent Dependency  Vent Dependent: Yes(intermittently) FiO2 (%): 28 % Weaning Trials: Yes Nocturnal Vent: Yes    Cuff Deflation Trial  GO Tolerated Cuff Deflation: Yes Length of Time for Cuff Deflation Trial: this has been intermittent w/ cuff inflated at times post transition from vent Behavior: Alert;Cooperative;Good eye contact;Oriented X3;Responsive to questions;Smiling Cuff Deflation Trial - Comments: pt exhibited coughing w/ cuff was slowly deflated; rest breaks and time given for pt to adjust to any airflow and/or laryngeal secretions. Pt was able to cough but appeared to clear secretions at the level of the trach, not orally.   Functional Assessment Tool Used: clinical  judgement Functional Limitations: Voice Voice Goal Status [phone]): At least 80 percent but less than 100 percent impaired, limited or restricted    [facility] 11/07/2017, 5:30 PM Orinda Kenner, [place], CCC-SLP

## 2017-11-07 NOTE — Progress Notes (Signed)
Occupational Therapy Treatment Patient Details Name: Julia Crawford MRN: [id number] DOB: [birth year] Today's Date: 11/07/2017    History of present illness Pt is an 82 yo F never smoker with history of DM II, hypothyroidism, myasthenia and asthma admitted via ED with 2-3 days of increasing SOB and diagnosed with acute on chronic hypercarbic respiratory failure due to asthma exacerbation. Initially required BiPAP in ED. Required intubation 10/21/17 for persistent bronchospasm, hypercarbia despite BiPAP.   Pt extubated 10/28/17 and tolerated well initially but developed progressive stridor and was re-intubated.  Pt noted to have severe subglottic stenosis upon re-intubation and trach tube placement performed 10/29/17.   OT comments  Co-treatment with PT this date to focus session on functional transfers. Pt agreeable and motivated to participate, eager to get out of the bed. RN provided suction prior to activity. Pt required Max assist x2 for supine to sit EOB and was able to tolerate sitting upright with close supervision to min guard. Max assist x2 for lateral scoots. Smiling once in the recliner. After rest break, pt performed shoulder flexion exercises x10 bilaterally to improve UE strength for mobility. Pt educated in pursed lip breathing to support rest breaks and minimize SOB. Pt nodded and able to return demonstrate. Will continue to progress.   Follow Up Recommendations  LTACH    Equipment Recommendations  Other (comment)(TBD)    Recommendations for Other Services      Precautions / Restrictions Precautions Precautions: Fall Restrictions Weight Bearing Restrictions: No Other Position/Activity Restrictions: HOB elevated >/= 30 deg       Mobility Bed Mobility Overal bed mobility: Needs Assistance Bed Mobility: Supine to Sit     Supine to sit: Max assist;+2 for physical assistance        Transfers Overall transfer level: Needs assistance Equipment used: None Transfers:  Lateral/Scoot Transfers          Lateral/Scoot Transfers: Max assist;+2 physical assistance;From elevated surface      Balance Overall balance assessment: Needs assistance Sitting-balance support: Feet supported;Bilateral upper extremity supported Sitting balance-Leahy Scale: Fair Sitting balance - Comments: fair-                                   ADL either performed or assessed with clinical judgement   ADL                                               Vision Patient Visual Report: No change from baseline     Perception     Praxis      Cognition Arousal/Alertness: Awake/alert Behavior During Therapy: WFL for tasks assessed/performed Overall Cognitive Status: Within Functional Limits for tasks assessed                                          Exercises Shoulder Exercises Shoulder Flexion: AROM;Strengthening;Right;Left;10 reps;Seated Other Exercises Other Exercises: Pt educated in pursed lip breathing to support SOB/vital signs. Able to return demo.   Shoulder Instructions       General Comments      Pertinent Vitals/ Pain       Pain Assessment: No/denies pain  Home Living  Prior Functioning/Environment              Frequency  Min 2X/week        Progress Toward Goals  OT Goals(current goals can now be found in the care plan section)  Progress towards OT goals: Progressing toward goals  Acute Rehab OT Goals Patient Stated Goal: get stronger OT Goal Formulation: With patient Time For Goal Achievement: 11/18/17 Potential to Achieve Goals: Good  Plan Discharge plan remains appropriate;Frequency remains appropriate    Co-evaluation    PT/OT/SLP Co-Evaluation/Treatment: Yes Reason for Co-Treatment: For patient/therapist safety;To address functional/ADL transfers   OT goals addressed during session: Strengthening/ROM;ADL's and  self-care      AM-PAC PT "6 Clicks" Daily Activity     Outcome Measure   Help from another person eating meals?: Total Help from another person taking care of personal grooming?: A Little Help from another person toileting, which includes using toliet, bedpan, or urinal?: A Lot Help from another person bathing (including washing, rinsing, drying)?: A Lot Help from another person to put on and taking off regular upper body clothing?: A Lot Help from another person to put on and taking off regular lower body clothing?: A Lot 6 Click Score: 12    End of Session    OT Visit Diagnosis: Other abnormalities of gait and mobility (R26.89);Muscle weakness (generalized) (M62.81)   Activity Tolerance Patient tolerated treatment well   Patient Left in chair;with call bell/phone within reach;with chair alarm set   Nurse Communication          Time: 2951-8841 OT Time Calculation (min): 37 min  Charges: OT General Charges $OT Visit: 1 Visit OT Treatments $Therapeutic Exercise: 8-22 mins  Jeni Salles, MPH, MS, OTR/L ascom [phone] 11/07/17, 3:55 PM

## 2017-11-07 NOTE — Care Management (Signed)
RNCM received call from [facility] with [facility] stating that they may not have a bed available today. She will update CM when it is.

## 2017-11-07 NOTE — Progress Notes (Signed)
Pharmacy Antibiotic Note  Julia Crawford is a 82 y.o. female admitted on 10/20/2017 with respiratory distress.  Pharmacy has been consulted for ceftazidime dosing.  Plan: Ceftazidime 2 g iv q 8 hours.   Height: 5' (152.4 cm) Weight: 205 lb 14.6 oz (93.4 kg) IBW/kg (Calculated) : 45.5  Temp (24hrs), Avg:98.3 F (36.8 C), Min:98.2 F (36.8 C), Max:98.6 F (37 C)  Recent Labs  Lab 11/03/17 0439 11/05/17 1440 11/07/17 0604  WBC 5.9  --  4.8  CREATININE 0.39* 0.46 0.34*    Estimated Creatinine Clearance: 57.3 mL/min (A) (by C-G formula based on SCr of 0.34 mg/dL (L)).    Allergies  Allergen Reactions  . Ace Inhibitors Cough    Other reaction(s): Unknown  . Apixaban     Other reaction(s): Unknown  . Azithromycin     Myasthenia Gravis Patient  . Butylene Glycol Other (See Comments)  . Codeine Itching and Swelling  . Doxycycline   . Lasix [Furosemide] Other (See Comments)    Chest and back pain  . Peanut Butter Flavor     rash?  . Telithromycin     Other reaction(s): Other (See Comments) Myasthenia Gravis Patient    Antimicrobials this admission: cefazolin 11/25 >> 11/26 ceftriaxone 11/26 >> 11/28 Ceftazidime 11/29 >>  Dose adjustments this admission:   Microbiology results: 11/11 BCx: NG 11/11 MRSA PCR: negative 11/12 TA: normal flora 11/15 TA: normal flora 11/16 SCx: Klebsiella 11/24 SCx: multiple organisms 11/26 TA: Pseudomonas   Thank you for allowing pharmacy to be a part of this patient's care.  Ulice Dash D 11/07/2017 1:10 PM

## 2017-11-07 NOTE — Progress Notes (Signed)
Physical Therapy Treatment Patient Details Name: Julia Crawford MRN: [id number] DOB: 1935/05/02 Today's Date: 11/07/2017    History of Present Illness Pt is an 82 yo F never smoker with history of DM II, hypothyroidism, myasthenia and asthma admitted via ED with 2-3 days of increasing SOB and diagnosed with acute on chronic hypercarbic respiratory failure due to asthma exacerbation. Initially required BiPAP in ED. Required intubation 10/21/17 for persistent bronchospasm, hypercarbia despite BiPAP.   Pt extubated 10/28/17 and tolerated well initially but developed progressive stridor and was re-intubated.  Pt noted to have severe subglottic stenosis upon re-intubation and trach tube placement performed 10/29/17.    PT Comments    Patient eager for OOB activities this date.  Demonstrates good effort and participation with all tasks, though requires gentle encouragement.  Visible discouraged with current situation; provided with support and encouragement throughout. Completes bed/chiar transfer via scoot pivot (over level surfaces) with max assist +2-3; improved active patient effort with this transfer method.   Will continue with emphasis on closed-chain activation of bilat LEs, sitting balance/core strength and overall functional endurance.    Follow Up Recommendations  SNF     Equipment Recommendations       Recommendations for Other Services       Precautions / Restrictions Precautions Precautions: Fall Restrictions Weight Bearing Restrictions: No    Mobility  Bed Mobility Overal bed mobility: Needs Assistance Bed Mobility: Supine to Sit     Supine to sit: Max assist;+2 for physical assistance     General bed mobility comments: assist for LE management and truncal elevation  Transfers Overall transfer level: Needs assistance   Transfers: Lateral/Scoot Transfers          Lateral/Scoot Transfers: Max assist;+2 physical assistance    Ambulation/Gait             General Gait Details: Unable/unsafe to attempt   Stairs            Wheelchair Mobility    Modified Rankin (Stroke Patients Only)       Balance Overall balance assessment: Needs assistance Sitting-balance support: No upper extremity supported;Feet supported Sitting balance-Leahy Scale: Fair Sitting balance - Comments: difficulty with forward weight shift beyond neutral                                    Cognition Arousal/Alertness: Awake/alert Behavior During Therapy: WFL for tasks assessed/performed Overall Cognitive Status: Within Functional Limits for tasks assessed                                        Exercises Other Exercises Other Exercises: Unsupported sitting edge of chair, participated with seated therex to promote strenght/endurnace of bilat UE/LE/trunk.  Consistent cuing to maintain isometric hip abduct, forward trunk lean with forward weight shfiting activities.  Fatigues quickly. Other Exercises: Instructed in LE therex (ankle pumps, LAQs) for use as HEP while up in chair.  Patient demonstrated understanding.    General Comments        Pertinent Vitals/Pain Pain Assessment: No/denies pain    Home Living                      Prior Function            PT Goals (current goals can now be found in the  care plan section) Acute Rehab PT Goals Patient Stated Goal: get stronger Time For Goal Achievement: 11/14/17 Potential to Achieve Goals: Good Progress towards PT goals: Progressing toward goals    Frequency    Min 2X/week      PT Plan Current plan remains appropriate    Co-evaluation PT/OT/SLP Co-Evaluation/Treatment: Yes Reason for Co-Treatment: For patient/therapist safety;To address functional/ADL transfers;Complexity of the patient's impairments (multi-system involvement) PT goals addressed during session: Mobility/safety with mobility;Balance;Strengthening/ROM OT goals addressed during  session: Strengthening/ROM      AM-PAC PT "6 Clicks" Daily Activity  Outcome Measure  Difficulty turning over in bed (including adjusting bedclothes, sheets and blankets)?: Unable Difficulty moving from lying on back to sitting on the side of the bed? : Unable Difficulty sitting down on and standing up from a chair with arms (e.g., wheelchair, bedside commode, etc,.)?: Unable Help needed moving to and from a bed to chair (including a wheelchair)?: Total Help needed walking in hospital room?: Total Help needed climbing 3-5 steps with a railing? : Total 6 Click Score: 6    End of Session Equipment Utilized During Treatment: Gait belt;Oxygen Activity Tolerance: Patient tolerated treatment well;Patient limited by fatigue Patient left: in chair;with call bell/phone within reach Nurse Communication: Mobility status PT Visit Diagnosis: Muscle weakness (generalized) (M62.81);Difficulty in walking, not elsewhere classified (R26.2)     Time: 6767-2094 PT Time Calculation (min) (ACUTE ONLY): 38 min  Charges:  $Therapeutic Exercise: 8-22 mins $Therapeutic Activity: 8-22 mins                    G Codes:       Thomas Rhude H. Owens Shark, PT, DPT, NCS 11/07/17, 10:40 PM [phone]

## 2017-11-07 NOTE — Care Management (Signed)
Informed by [facility] that anticipate a discharge from the facility 11/30 and this patient is first on the list for the bed.  Updated ICU.

## 2017-11-07 NOTE — Progress Notes (Signed)
Pt remained on Aerosol Trach Collar at 40% throughout the night without respiratory distress. Pt remains comfortable at this time. Will cont. to monitor pt.

## 2017-11-07 NOTE — Progress Notes (Signed)
Patient called nurse into the room and mouthed words and wrote on the white board that she does not want to live anymore. Nurse spoke to patient regarding if she had any intention of self harming and she mouthed "no". Nurse has offer to call chaplain for patient and she declined. Nurse ask patient if she was feeling depress and hopeless and patient nodded. Nurse has and will continue to provide emotional and physical support to the patient.

## 2017-11-07 NOTE — Care Management (Signed)
This RNCM received late notification yesterday evening that patient's insurance has approved LTAC with [facility] however I'm told it will be after 0900 before that is confirmed.  RNCM will follow. Patient still needs to be updated and so does daughter. I will wait until around 1000AM to speak with them since it is so early.

## 2017-11-07 NOTE — Progress Notes (Signed)
Pt was transported to ICU 15 without complication. Pt remains on 28% ATC.

## 2017-11-07 NOTE — Progress Notes (Signed)
Sitting up in recliner chair watching tv.  Tolerating chair well.  Patient has been on trach collar all shift and tolerating well.  Daughter visiting at this time.

## 2017-11-08 ENCOUNTER — Ambulatory Visit (HOSPITAL_COMMUNITY)
Admission: AD | Admit: 2017-11-08 | Discharge: 2017-11-08 | Disposition: A | Discharge status: Home / Self Care | Payer: Medicare Other | Source: Other Acute Inpatient Hospital | Attending: Internal Medicine | Admitting: Internal Medicine

## 2017-11-08 ENCOUNTER — Inpatient Hospital Stay
Admission: RE | Admit: 2017-11-08 | Discharge: 2017-12-10 | Disposition: E | Payer: Medicare Other | Source: Other Acute Inpatient Hospital | Attending: Internal Medicine | Admitting: Internal Medicine

## 2017-11-08 DIAGNOSIS — J9601 Acute respiratory failure with hypoxia: Secondary | ICD-10-CM

## 2017-11-08 DIAGNOSIS — Z4659 Encounter for fitting and adjustment of other gastrointestinal appliance and device: Secondary | ICD-10-CM

## 2017-11-08 DIAGNOSIS — Z0189 Encounter for other specified special examinations: Secondary | ICD-10-CM

## 2017-11-08 DIAGNOSIS — D499 Neoplasm of unspecified behavior of unspecified site: Secondary | ICD-10-CM

## 2017-11-08 DIAGNOSIS — R0602 Shortness of breath: Secondary | ICD-10-CM

## 2017-11-08 DIAGNOSIS — J9690 Respiratory failure, unspecified, unspecified whether with hypoxia or hypercapnia: Secondary | ICD-10-CM | POA: Insufficient documentation

## 2017-11-08 DIAGNOSIS — J9600 Acute respiratory failure, unspecified whether with hypoxia or hypercapnia: Secondary | ICD-10-CM

## 2017-11-08 LAB — PHOSPHORUS: Phosphorus: 2.1 mg/dL — ABNORMAL LOW (ref 2.5–4.6)

## 2017-11-08 LAB — BASIC METABOLIC PANEL
ANION GAP: 6 (ref 5–15)
BUN: 16 mg/dL (ref 6–20)
CALCIUM: 9.2 mg/dL (ref 8.9–10.3)
CO2: 30 mmol/L (ref 22–32)
Chloride: 101 mmol/L (ref 101–111)
Creatinine, Ser: 0.36 mg/dL — ABNORMAL LOW (ref 0.44–1.00)
GFR calc Af Amer: >60 mL/min (ref >60)
GLUCOSE: 169 mg/dL — AB (ref 65–99)
POTASSIUM: 4.3 mmol/L (ref 3.5–5.1)
SODIUM: 137 mmol/L (ref 135–145)

## 2017-11-08 LAB — CULTURE, RESPIRATORY

## 2017-11-08 LAB — GLUCOSE, CAPILLARY
Glucose-Capillary: 158 mg/dL — ABNORMAL HIGH (ref 65–99)
Glucose-Capillary: 157 mg/dL — ABNORMAL HIGH (ref 65–99)
Glucose-Capillary: 158 mg/dL — ABNORMAL HIGH (ref 65–99)
Glucose-Capillary: 163 mg/dL — ABNORMAL HIGH (ref 65–99)
Glucose-Capillary: 200 mg/dL — ABNORMAL HIGH (ref 65–99)

## 2017-11-08 LAB — CULTURE, RESPIRATORY W GRAM STAIN

## 2017-11-08 LAB — MAGNESIUM: MAGNESIUM: 1.5 mg/dL — AB (ref 1.7–2.4)

## 2017-11-08 MED ORDER — DEXTROSE 5 % IV SOLN
2.0000 g | Freq: Three times a day (TID) | INTRAVENOUS | Status: DC
  Administered 2017-11-08: 2 g via INTRAVENOUS
  Filled 2017-11-08 (×2): qty 2

## 2017-11-08 MED ORDER — SIMVASTATIN 10 MG PO TABS: 10.0000 mg | ORAL_TABLET | Freq: Every day | ORAL | 1 refills | Status: AC

## 2017-11-08 MED ORDER — MAGNESIUM SULFATE 2 GM/50ML IV SOLN
2.0000 g | Freq: Once | INTRAVENOUS | Status: AC
  Administered 2017-11-08: 2 g via INTRAVENOUS
  Filled 2017-11-08: qty 50

## 2017-11-08 MED ORDER — SENNOSIDES 8.8 MG/5ML PO SYRP: 5.0000 mL | ORAL_SOLUTION | Freq: Two times a day (BID) | ORAL | 0 refills | Status: AC

## 2017-11-08 MED ORDER — SIMVASTATIN 20 MG PO TABS
10.0000 mg | ORAL_TABLET | Freq: Every day | ORAL | Status: DC
  Administered 2017-11-08: 10 mg
  Filled 2017-11-08: qty 1

## 2017-11-08 MED ORDER — CYANOCOBALAMIN 1000 MCG PO TABS: 1000.0000 ug | ORAL_TABLET | Freq: Every day | ORAL | Status: AC

## 2017-11-08 MED ORDER — FAMOTIDINE 20 MG PO TABS: 20.0000 mg | ORAL_TABLET | Freq: Every day | ORAL | Status: AC

## 2017-11-08 MED ORDER — LEVOTHYROXINE SODIUM 100 MCG PO TABS
100.0000 ug | ORAL_TABLET | ORAL | Status: AC
Start: 2017-11-08 — End: ?

## 2017-11-08 MED ORDER — VITAMIN B-12 1000 MCG PO TABS
1000.0000 ug | ORAL_TABLET | Freq: Every day | ORAL | Status: DC
  Administered 2017-11-08: 1000 ug
  Filled 2017-11-08: qty 1

## 2017-11-08 MED ORDER — ASPIRIN 81 MG PO CHEW: 81.0000 mg | CHEWABLE_TABLET | Freq: Every day | ORAL | Status: AC

## 2017-11-08 MED ORDER — DILTIAZEM 12 MG/ML ORAL SUSPENSION
30.0000 mg | Freq: Three times a day (TID) | ORAL | Status: DC
  Filled 2017-11-08 (×3): qty 3

## 2017-11-08 MED ORDER — DILTIAZEM 12 MG/ML ORAL SUSPENSION: 30.0000 mg | Freq: Three times a day (TID) | ORAL | Status: AC

## 2017-11-08 MED ORDER — SERTRALINE HCL 25 MG PO TABS: 25.0000 mg | ORAL_TABLET | Freq: Every day | ORAL | Status: AC

## 2017-11-08 MED ORDER — RIVAROXABAN 20 MG PO TABS: 20.0000 mg | ORAL_TABLET | Freq: Every day | ORAL | Status: AC

## 2017-11-08 MED ORDER — DILTIAZEM 12 MG/ML ORAL SUSPENSION
30.0000 mg | Freq: Three times a day (TID) | ORAL | Status: DC
  Filled 2017-11-08 (×6): qty 3

## 2017-11-08 MED ORDER — PYRIDOSTIGMINE BROMIDE 60 MG PO TABS: 60.0000 mg | ORAL_TABLET | Freq: Three times a day (TID) | ORAL | Status: AC

## 2017-11-08 MED ORDER — DILTIAZEM HCL 30 MG PO TABS
30.0000 mg | ORAL_TABLET | Freq: Three times a day (TID) | ORAL | Status: DC
  Administered 2017-11-08: 30 mg via ORAL
  Filled 2017-11-08: qty 1

## 2017-11-08 MED ORDER — CHLORHEXIDINE GLUCONATE 0.12% ORAL RINSE (MEDLINE KIT): 15.0000 mL | Freq: Two times a day (BID) | OROMUCOSAL | 0 refills | Status: AC

## 2017-11-08 MED ORDER — BISACODYL 10 MG RE SUPP: 10.0000 mg | Freq: Every day | RECTAL | 0 refills | Status: AC | PRN

## 2017-11-08 MED ORDER — SERTRALINE HCL 50 MG PO TABS
25.0000 mg | ORAL_TABLET | Freq: Every day | ORAL | Status: DC
  Administered 2017-11-08: 25 mg
  Filled 2017-11-08: qty 1

## 2017-11-08 MED ORDER — INSULIN GLARGINE 100 UNIT/ML ~~LOC~~ SOLN: 10.0000 [IU] | Freq: Every day | SUBCUTANEOUS | 11 refills | Status: AC

## 2017-11-08 MED ORDER — IPRATROPIUM BROMIDE 0.02 % IN SOLN: 0.5000 mg | Freq: Four times a day (QID) | RESPIRATORY_TRACT | 12 refills | Status: AC

## 2017-11-08 MED ORDER — CEFTAZIDIME SODIUM IN D5W 2-5 GM/50ML-% IV SOLN: 2.0000 g | Freq: Three times a day (TID) | INTRAVENOUS | Status: AC

## 2017-11-08 MED ORDER — BACITRACIN-NEOMYCIN-POLYMYXIN 400-5-5000 EX OINT: TOPICAL_OINTMENT | Freq: Two times a day (BID) | CUTANEOUS | 0 refills | Status: AC

## 2017-11-08 MED ORDER — BUDESONIDE 0.25 MG/2ML IN SUSP: 0.2500 mg | Freq: Four times a day (QID) | RESPIRATORY_TRACT | 12 refills | Status: AC

## 2017-11-08 MED ORDER — LEVALBUTEROL HCL 0.63 MG/3ML IN NEBU: 0.6300 mg | INHALATION_SOLUTION | RESPIRATORY_TRACT | 12 refills | Status: AC | PRN

## 2017-11-08 MED ORDER — TRAZODONE HCL 50 MG PO TABS: 50.0000 mg | ORAL_TABLET | Freq: Every evening | ORAL | Status: AC | PRN

## 2017-11-08 MED ORDER — ONDANSETRON HCL 4 MG/2ML IJ SOLN: 4.0000 mg | Freq: Four times a day (QID) | INTRAMUSCULAR | 0 refills | Status: AC | PRN

## 2017-11-08 MED ORDER — FREE WATER: 100.0000 mL | Freq: Three times a day (TID) | Status: AC

## 2017-11-08 MED ORDER — CEFTAZIDIME SODIUM IN D5W 2-5 GM/50ML-% IV SOLN
2.0000 g | Freq: Three times a day (TID) | INTRAVENOUS | Status: DC
  Administered 2017-11-08: 2 g via INTRAVENOUS
  Filled 2017-11-08 (×2): qty 50

## 2017-11-08 MED ORDER — CEFTAZIDIME SODIUM IN D5W 2-5 GM/50ML-% IV SOLN
2.0000 g | Freq: Three times a day (TID) | INTRAVENOUS | Status: DC
  Administered 2017-11-08: 2 g via INTRAVENOUS
  Filled 2017-11-08 (×5): qty 50

## 2017-11-08 MED ORDER — VITAL AF 1.2 CAL PO LIQD: 1000.0000 mL | ORAL | Status: AC

## 2017-11-08 MED ORDER — ASPIRIN 81 MG PO CHEW
81.0000 mg | CHEWABLE_TABLET | Freq: Every day | ORAL | Status: DC
  Administered 2017-11-08: 81 mg
  Filled 2017-11-08: qty 1

## 2017-11-08 MED ORDER — POTASSIUM & SODIUM PHOSPHATES 280-160-250 MG PO PACK
2.0000 | PACK | ORAL | Status: AC
  Administered 2017-11-08 (×2): 2
  Filled 2017-11-08 (×2): qty 2

## 2017-11-08 MED ORDER — INSULIN ASPART 100 UNIT/ML ~~LOC~~ SOLN: 0.0000 [IU] | SUBCUTANEOUS | 11 refills | Status: AC

## 2017-11-08 MED ORDER — LEVALBUTEROL HCL 0.63 MG/3ML IN NEBU: 0.6300 mg | INHALATION_SOLUTION | Freq: Four times a day (QID) | RESPIRATORY_TRACT | 12 refills | Status: AC

## 2017-11-08 NOTE — Progress Notes (Addendum)
[facility] Consultation     Date: 10/15/2017,   MRN# [id number] ESHANI MAESTRE [birth year] Code Status:     Code Status Orders  (From admission, onward)        Start     Ordered   10/22/17 1957  Do not attempt resuscitation (DNR)  Continuous    Question Answer Comment  In the event of cardiac or respiratory ARREST Do not call a "code blue"   In the event of cardiac or respiratory ARREST Do not perform Intubation, CPR, defibrillation or ACLS   In the event of cardiac or respiratory ARREST Use medication by any route, position, wound care, and other measures to relive pain and suffering. May use oxygen, suction and manual treatment of airway obstruction as needed for comfort.      10/22/17 1956    Code Status History    Date Active Date Inactive Code Status Order ID Comments User Context   10/20/2017 18:00 10/22/2017 19:56 Full Code [id number]  Harrie Foreman, MD ED   04/14/2015 14:01 04/16/2015 15:19 Full Code [id number]  Vaughan Basta, MD ED     Hosp day:@LENGTHOFSTAYDAYS @ Referring MD: @ATDPROV @     PCP:      AdmissionWeight: 200 lb (90.7 kg)                 CurrentWeight: 200 lb 9.9 oz (91 kg) WEALTHY DANIELSKI is a 82 y.o. old female      CHIEF COMPLAINT:   50 F never smoker with history of DM II, hypothyroidism, myasthenia and asthma admitted via [place] SOB. Initially required BiPAP in ED. Required intubation 11/12 for persistent bronchospasm, hypercarbia.  Extubated 11/19 but was reintubated for severe subglottic stenosis. S/P tract 10/30/17.   SUBJECTIVE:   No acute overnight issues. Denies chest pain / pressure, SOB. Remains in afib. Frequent PVCs.  MEDICATIONS    Current Medication:   Current Facility-Administered Medications:  .  acetaminophen (TYLENOL) tablet 650 mg, 650 mg, Per Tube, Q6H PRN, 650 mg at 11/06/17 1935 **OR** [DISCONTINUED] acetaminophen (TYLENOL) suppository 650 mg, 650 mg, Rectal, Q6H PRN, Harrie Foreman, MD .  bisacodyl (DULCOLAX) suppository 10 mg, 10 mg, Rectal, Daily PRN, Wilhelmina Mcardle, MD, 10 mg at 11/01/17 1904 .  budesonide (PULMICORT) nebulizer solution 0.25 mg, 0.25 mg, Nebulization, Q6H, Wilhelmina Mcardle, MD, 0.25 mg at 10/27/2017 0745 .  ceftAZIDime (FORTAZ) IVPB 2 g, 2 g, Intravenous, Q8H, Simonds, David B, MD .  chlorhexidine gluconate (MEDLINE KIT) (PERIDEX) 0.12 % solution 15 mL, 15 mL, Mouth Rinse, BID, Blakeney, Dreama Saa, NP, 15 mL at 10/23/2017 0802 .  famotidine (PEPCID) tablet 20 mg, 20 mg, Per Tube, Daily, Wilhelmina Mcardle, MD, 20 mg at 10/11/2017 1025 .  feeding supplement (VITAL AF 1.2 CAL) liquid 1,000 mL, 1,000 mL, Per Tube, Continuous, Wilhelmina Mcardle, MD, Last Rate: 65 mL/hr at 10/24/2017 1218, 1,000 mL at 10/11/2017 1218 .  free water 100 mL, 100 mL, Per Tube, Q8H, Wilhelmina Mcardle, MD, 100 mL at 10/11/2017 0554 .  insulin aspart (novoLOG) injection 0-15 Units, 0-15 Units, Subcutaneous, Q4H, Wilhelmina Mcardle, MD, 3 Units at 11/04/2017 1141 .  insulin glargine (LANTUS) injection 10 Units, 10 Units, Subcutaneous, Daily, Wilhelmina Mcardle, MD, 10 Units at 11/07/17 2357 .  ipratropium (ATROVENT) nebulizer solution 0.5 mg, 0.5 mg, Nebulization, Q6H, Wilhelmina Mcardle, MD, 0.5 mg at 10/11/2017 0746 .  levalbuterol (XOPENEX) nebulizer solution 0.63 mg, 0.63 mg, Nebulization, Q3H PRN, Simonds,  Zella Richer, MD, 0.63 mg at 10/29/17 0115 .  levalbuterol (XOPENEX) nebulizer solution 0.63 mg, 0.63 mg, Nebulization, Q6H, Sudini, Srikar, MD, 0.63 mg at 10/24/2017 0746 .  levothyroxine (SYNTHROID, LEVOTHROID) tablet 100 mcg, 100 mcg, Per Tube, Benson Setting, MD, 100 mcg at 10/27/2017 0754 .  MEDLINE mouth rinse, 15 mL, Mouth Rinse, 10 times per day, Awilda Bill, NP, 15 mL at 10/17/2017 1026 .  neomycin-bacitracin-polymyxin (NEOSPORIN) ointment, , Topical, BID, Wilhelmina Mcardle, MD, 1 application at 87/56/43 1141 .  [DISCONTINUED] ondansetron (ZOFRAN) tablet 4 mg, 4 mg, Oral, Q6H PRN  **OR** ondansetron (ZOFRAN) injection 4 mg, 4 mg, Intravenous, Q6H PRN, Harrie Foreman, MD .  pyridostigmine (MESTINON) tablet 60 mg, 60 mg, Per Tube, Q8H, Wilhelmina Mcardle, MD, 60 mg at 10/23/2017 0554 .  rivaroxaban (XARELTO) tablet 20 mg, 20 mg, Oral, Q supper, Napoleon Form, RPH, 20 mg at 11/07/17 1531 .  sennosides (SENOKOT) 8.8 MG/5ML syrup 5 mL, 5 mL, Per Tube, BID, Wilhelmina Mcardle, MD, 5 mL at 11/07/17 2357 .  sodium chloride flush (NS) 0.9 % injection 10-40 mL, 10-40 mL, Intracatheter, Q12H, Wilhelmina Mcardle, MD, 10 mL at 10/28/2017 1026 .  sodium chloride flush (NS) 0.9 % injection 10-40 mL, 10-40 mL, Intracatheter, PRN, Wilhelmina Mcardle, MD .  traZODone (DESYREL) tablet 50 mg, 50 mg, Per Tube, QHS PRN, Wilhelmina Mcardle, MD     REVIEW OF SYSTEMS    VS: BP 106/62 (BP Location: Right Arm)   Pulse 89   Temp 98.1 F (36.7 C) (Oral)   Resp 18   Ht 5' (1.524 m)   Wt 200 lb 9.9 oz (91 kg)   SpO2 97%   BMI 39.18 kg/m      PHYSICAL EXAM   Physical Exam   Awake, alert, following commands. Trach in place. CVS: S1, S2 0, no murmurs Chest: CTA bilaterally. Abd: soft, NT. BS positive. LE: no edema.   LABS    Recent Labs    11/05/17 1440 11/07/17 0604 11/03/2017 0834  HGB  --  11.3*  --   HCT  --  33.5*  --   MCV  --  102.1*  --   WBC  --  4.8  --   BUN 13 14 16   CREATININE 0.46 0.34* 0.36*  GLUCOSE 239* 113* 169*  CALCIUM 8.8* 9.1 9.2  ,    No results for input(s): PH in the last 72 hours.  Invalid input(s): PCO2, PO2, BASEEXCESS, BASEDEFICITE, TFT    CULTURE RESULTS   Recent Results (from the past 240 hour(s))  Culture, respiratory (NON-Expectorated)     Status: Abnormal   Collection Time: 11/02/17 12:03 PM  Result Value Ref Range Status   Specimen Description INDUCED SPUTUM  Final   Special Requests NONE  Final   Gram Stain   Final    ABUNDANT WBC PRESENT, PREDOMINANTLY PMN FEW GRAM NEGATIVE COCCOBACILLI RARE GRAM POSITIVE COCCI IN  PAIRS RARE GRAM POSITIVE RODS NO SQUAMOUS EPITHELIAL CELLS SEEN Performed at [facility], [place] [address] [place], [place] [address]    Culture MULTIPLE ORGANISMS PRESENT, NONE PREDOMINANT (A)  Final   Report Status 11/05/2017 FINAL  Final  Culture, respiratory (NON-Expectorated)     Status: None   Collection Time: 11/04/17 10:29 AM  Result Value Ref Range Status   Specimen Description TRACHEAL ASPIRATE  Final   Special Requests NONE  Final   Gram Stain   Final  MODERATE WBC PRESENT, PREDOMINANTLY PMN FEW GRAM NEGATIVE RODS Performed at [facility], [place] [address], [place], [place] [address]    Culture   Final    FEW PSEUDOMONAS AERUGINOSA RARE KLEBSIELLA PNEUMONIAE    Report Status 10/25/2017 FINAL  Final   Organism ID, Bacteria PSEUDOMONAS AERUGINOSA  Final   Organism ID, Bacteria KLEBSIELLA PNEUMONIAE  Final      Susceptibility   Klebsiella pneumoniae - MIC*    AMPICILLIN RESISTANT Resistant     CEFAZOLIN <=4 SENSITIVE Sensitive     CEFEPIME <=1 SENSITIVE Sensitive     CEFTAZIDIME <=1 SENSITIVE Sensitive     CEFTRIAXONE <=1 SENSITIVE Sensitive     CIPROFLOXACIN <=0.25 SENSITIVE Sensitive     GENTAMICIN <=1 SENSITIVE Sensitive     IMIPENEM <=0.25 SENSITIVE Sensitive     TRIMETH/SULFA <=20 SENSITIVE Sensitive     AMPICILLIN/SULBACTAM 4 SENSITIVE Sensitive     PIP/TAZO <=4 SENSITIVE Sensitive     Extended ESBL NEGATIVE Sensitive     * RARE KLEBSIELLA PNEUMONIAE   Pseudomonas aeruginosa - MIC*    CEFTAZIDIME <=1 SENSITIVE Sensitive     CIPROFLOXACIN <=0.25 SENSITIVE Sensitive     GENTAMICIN <=1 SENSITIVE Sensitive     IMIPENEM 1 SENSITIVE Sensitive     PIP/TAZO <=4 SENSITIVE Sensitive     CEFEPIME <=1 SENSITIVE Sensitive     * FEW PSEUDOMONAS AERUGINOSA          IMAGING    No results found.       ASSESSMENT/PLAN     57 F never smoker with history of DM II, hypothyroidism, myasthenia and asthma admitted via [place] SOB. Initially  required BiPAP in ED. Required intubation 11/12 for persistent bronchospasm, hypercarbia.  Extubated 11/19 but was reintubated for severe subglottic stenosis. S/P tract 10/30/17.   Problem list:  Respiratory failure requiring tracheostomy Sub glottic stenosis CAD afib CHF DM Dyslipidemia Myasthenia gravis Hypothyroidism Asthma with exacerbation - exacerbation resolved. Klebsiella PNA 10/24/17, Pseudomonas PNA 11/04/17.  Has been on TC since yesterday and is tolerating it well. Continue on trach collar as tolerated. Continue xopenex and atrovent Q6h Continune steroid nebs. Continue ceftazidime for pseudomonas pneumonia . Total duration of abx should be 14 days.  Patient found to have hypomagnesemia and hypophosphatemia. These were likely causing PVCs. Replace electrolytes.  Continue xeralto. Re-start home cardizem for afib. Re-start home ASA.  On pyridostigmine for myasthenia gravis. Re-start home azathioprine (used for myasthenia gravis).  Re-start home zoloft  Re-start home statins  Re-start home lasix.  Continue synthroid  Insulin SS and lantus for glycemic control. Holding home glucophage  Pepcid for SUP.  Transfer to [facility] later today  CC time including time for discharge to SNF was 60 minutes.    Nettie Elm, M.D.  Pulmonary & Critical Care Medicine    Addendum: Cannot start azathioprine as it cannot be crushed. Consider re-starting it when the patient is able to take PO. Patient's BP is in the low 100s. Will start cardizem 30 mg Q8h and if the blood pressure tolerates, it can be increased to her home dose.  Holding home lasix secondary to the above as well. May be re-started noce the patient's BP remains stable after starting the cardizem.   Nettie Elm, M.D.  Pulmonary & Critical Care Medicine

## 2017-11-08 NOTE — Progress Notes (Signed)
Pt remained on ATC throughout the night. No respiratory distress noted. Copious amounts of secretions.

## 2017-11-08 NOTE — Plan of Care (Signed)
Pt vital signs uneventful overnight.  Pt bed in chair position for comfort.  Tolerated overnight tube feedings.  Continues to have copious secretions via trach.

## 2017-11-08 NOTE — Progress Notes (Signed)
[facility] NAME: Julia Crawford    MR#:  [id number]  DATE OF BIRTH:  07-18-1935  SUBJECTIVE:  CHIEF COMPLAINT:   Chief Complaint  Patient presents with  . Respiratory Distress  waiting for LTAC, no new issues REVIEW OF SYSTEMS:   Review of Systems  Constitutional: Positive for malaise/fatigue. Negative for chills and fever.  HENT: Negative for sore throat.   Eyes: Negative for blurred vision, double vision and pain.  Respiratory: Positive for cough and shortness of breath. Negative for hemoptysis and wheezing.   Cardiovascular: Positive for leg swelling. Negative for chest pain, palpitations and orthopnea.  Gastrointestinal: Negative for abdominal pain, constipation, diarrhea, heartburn, nausea and vomiting.  Genitourinary: Negative for dysuria and hematuria.  Musculoskeletal: Negative for back pain and joint pain.  Skin: Negative for rash.  Neurological: Positive for weakness. Negative for sensory change, speech change, focal weakness and headaches.  Endo/Heme/Allergies: Does not bruise/bleed easily.  Psychiatric/Behavioral: Negative for depression. The patient is not nervous/anxious.    DRUG ALLERGIES:   Allergies  Allergen Reactions  . Ace Inhibitors Cough    Other reaction(s): Unknown  . Apixaban     Other reaction(s): Unknown  . Azithromycin     Myasthenia Gravis Patient  . Butylene Glycol Other (See Comments)  . Codeine Itching and Swelling  . Doxycycline   . Lasix [Furosemide] Other (See Comments)    Chest and back pain  . Peanut Butter Flavor     rash?  . Telithromycin     Other reaction(s): Other (See Comments) Myasthenia Gravis Patient    VITALS:  Blood pressure 101/69, pulse 84, temperature 98.1 F (36.7 C), temperature source Oral, resp. rate (!) 39, height 5' (1.524 m), weight 91 kg (200 lb 9.9 oz), SpO2 99 %.  PHYSICAL EXAMINATION:  GENERAL:  82 y.o.-year-old patient lying in the bed.  EYES: Pupils  equal, round, reactive to light and accommodation. No scleral icterus. Extraocular muscles intact.  HEENT: Head atraumatic, normocephalic. Oropharynx and nasopharynx clear. Trach in place,dressing intact NECK:  Supple, no jugular venous distention. No thyroid enlargement, no tenderness.  LUNGS: Normal breath sounds bilaterally, wheezing, no crepitation. Trach CARDIOVASCULAR: S1, S2 normal. No murmurs, rubs, or gallops.  ABDOMEN: Soft, nontender, nondistended. Bowel sounds present. No organomegaly or mass.  EXTREMITIES: No pedal edema, cyanosis, or clubbing.  NEUROLOGIC: sedated SKIN: No obvious rash, lesion, or ulcer.   Physical Exam LABORATORY PANEL:   CBC Recent Labs  Lab 11/07/17 0604  WBC 4.8  HGB 11.3*  HCT 33.5*  PLT 231   ------------------------------------------------------------------------------------------------------------------  Chemistries  Recent Labs  Lab 11/07/2017 0834  NA 137  K 4.3  CL 101  CO2 30  GLUCOSE 169*  BUN 16  CREATININE 0.36*  CALCIUM 9.2  MG 1.5*   ------------------------------------------------------------------------------------------------------------------  ASSESSMENT AND PLAN:   Active Problems:   Acute on chronic respiratory failure with hypercapnia (HCC)   Dyspnea   Pressure injury of skin  1.  Respiratory failure: Acute on chronic with hypoxia and hypercapnia.   - Status asthamaticus, tracheobronchitis Extubated on 10/28/2017, but reintubated due to subglottic stenosis - Tracheostomy 11/21, dressing changed by ENT, continue Rocephin Transfer to LTAC when approved from insurance - hopefully today - on trach collar, Tube feeds through DBT 2.  CAD: Stable; continue aspirin 3.  Atrial fibrillation: Rate controlled; continue diltiazem 4.  CHF: Systolic; chronic 5.  Diabetes mellitus type 2: keep on ISS. 6. Hyperlipidemia: Continue statin therapy 7.  Myasthenia gravis: Stable; continue pyridostigmine 8.  Depression: Continue  sertraline 9.  DVT prophylaxis: Lovenox     CODE STATUS: DNR  TOTAL TIME TAKING CARE OF THIS PATIENT: 15 minutes.   Max Sane M.D on 10/18/2017   Between 7am to 6pm - Pager - [phone]  After 6pm go to [email or web address] - password [email or web address] [facility]  Office  [phone]  CC: Primary care physician; Jerrol Banana., MD  Note: This dictation was prepared with Dragon dictation along with smaller phrase technology. Any transcriptional errors that result from this process are unintentional.

## 2017-11-08 NOTE — Progress Notes (Signed)
Pt has frequent multifocal PVC's. Rhythm appears to be A. Fib/flutter. Per previous shift nurse-pt had a 28 beat run of SVT last night. Pt may be transferring to SNF today, no am labs have been drawn.  -BMP, Mg, P ordered.

## 2017-11-08 NOTE — Progress Notes (Signed)
Occupational Therapy Treatment Patient Details Name: PAETON LATOUCHE MRN: [id number] DOB: June 08, 1935 Today's Date: 10/22/2017    History of present illness Pt is an 82 yo F never smoker with history of DM II, hypothyroidism, myasthenia and asthma admitted via ED with 2-3 days of increasing SOB and diagnosed with acute on chronic hypercarbic respiratory failure due to asthma exacerbation. Initially required BiPAP in ED. Required intubation 10/21/17 for persistent bronchospasm, hypercarbia despite BiPAP.   Pt extubated 10/28/17 and tolerated well initially but developed progressive stridor and was re-intubated.  Pt noted to have severe subglottic stenosis upon re-intubation and trach tube placement performed 10/29/17.   OT comments  Spoke with NSG about getting pt up and OOB and she stated that was not possible due to recent incontinence and waiting for more bed pads before getting her up. Pt's sister Hassan Rowan present and is one of 8 siblings. Worked on hand to face tasks and AROM exercises while sitting up in bed with close monitoring of HR, RR and sats.  Reviewed energy conserv tech and need to keep activating muscles while in bed and to get up in chair as much as possible at next venue to continue to work on stamina, help move secretions out and prevent bed sores and muscle wasting.  Pt to discharge to [facility] or SNF tonight at 7pm per NSG report.  Follow Up Recommendations  LTACH    Equipment Recommendations       Recommendations for Other Services      Precautions / Restrictions Precautions Precautions: Fall Precaution Comments: trach collar in place Restrictions Weight Bearing Restrictions: No Other Position/Activity Restrictions: HOB elevated >/= 30 deg       Mobility Bed Mobility                  Transfers                      Balance                                           ADL either performed or assessed with clinical judgement   ADL  Overall ADL's : Needs assistance/impaired   Eating/Feeding Details (indicate cue type and reason): tracheostomy                                    General ADL Comments: Spoke with NSG about getting pt up and OOB and she stated that was not possible due to recent incontinence and waiting for more bed pads before getting her up. Pt's sister Hassan Rowan present and is one of 8 siblings. Worked on hand to face tasks and AROM exercises while sitting up in bed with close monitoring of HR, RR and sats.  Reviewed energy conserv tech and need to keep activating muscles while in bed and to get up in chair as much as possible at next venue to continue to work on stamina, help move secretions out and prevent bed sores and muscle wasting.  Pt to [facility] to [facility] or SNF tonight at 7pm per NSG report.     Vision Baseline Vision/History: Wears glasses Wears Glasses: At all times Patient Visual Report: No change from baseline     Perception     Praxis      Cognition Arousal/Alertness: Awake/alert Behavior  During Therapy: WFL for tasks assessed/performed Overall Cognitive Status: Within Functional Limits for tasks assessed                                          Exercises     Shoulder Instructions       General Comments      Pertinent Vitals/ Pain       Pain Assessment: No/denies pain  Home Living                                          Prior Functioning/Environment              Frequency  Min 2X/week        Progress Toward Goals  OT Goals(current goals can now be found in the care plan section)  Progress towards OT goals: Progressing toward goals  Acute Rehab OT Goals Patient Stated Goal: get stronger OT Goal Formulation: With patient Time For Goal Achievement: 11/18/17 Potential to Achieve Goals: Good  Plan Discharge plan remains appropriate;Frequency remains appropriate    Co-evaluation                 AM-PAC  PT "6 Clicks" Daily Activity     Outcome Measure   Help from another person eating meals?: Total Help from another person taking care of personal grooming?: A Lot Help from another person toileting, which includes using toliet, bedpan, or urinal?: Total Help from another person bathing (including washing, rinsing, drying)?: Total Help from another person to put on and taking off regular upper body clothing?: Total Help from another person to put on and taking off regular lower body clothing?: Total 6 Click Score: 7    End of Session    OT Visit Diagnosis: Other abnormalities of gait and mobility (R26.89);Muscle weakness (generalized) (M62.81)   Activity Tolerance Patient tolerated treatment well   Patient Left in bed;with call bell/phone within reach;with bed alarm set;with family/visitor present   Nurse Communication      Functional Assessment Tool Used: AM-PAC 6 Clicks Daily Activity;Clinical judgement Functional Limitation: Self care Self Care Current Status (W9798): At least 60 percent but less than 80 percent impaired, limited or restricted Self Care Goal Status (X2119): At least 40 percent but less than 60 percent impaired, limited or restricted   Time: 1415-1455 OT Time Calculation (min): 40 min  Charges: OT G-codes **NOT FOR INPATIENT CLASS** Functional Assessment Tool Used: AM-PAC 6 Clicks Daily Activity;Clinical judgement Functional Limitation: Self care Self Care Current Status (E1740): At least 60 percent but less than 80 percent impaired, limited or restricted Self Care Goal Status (C1448): At least 40 percent but less than 60 percent impaired, limited or restricted OT General Charges $OT Visit: 1 Visit OT Treatments $Self Care/Home Management : 38-52 mins  Chrys Racer, OTR/L ascom [phone] 10/14/2017, 3:10 PM

## 2017-11-08 NOTE — Progress Notes (Addendum)
SLP Cancellation Note  Patient Details Name: Julia Crawford MRN: [id number] DOB: [birth year]   Cancelled treatment:       Reason Eval/Treat Not Completed: Medical issues which prohibited therapy(chart reviewed; consulted MD/NSG re: pt's status)  Discussed pt's status w/ regard to the PMV attempt/use and concern for poor upper airway movement to support exhalation/breathing and speech. Explained that pt felt min-mod discomfort when attempting to wear the PMV and felt "easier breathing" when PMV was removed.  MD stated to hold on any PMV wear at this time until any potential edema resolves; possible need for f/u ENT direct viewing to help assess vocal cords/larynx. Recommend f/u w/ ST services at Rehab at next venue of care to establish PMV wear/use for verbal communication as well as use for swallowing(oral diet) - pt has an NG currently for nutritional support. MD agreed. Pt continues w/ frequent oral care; mouthing and using a writing board for communication.     Orinda Kenner, MS, CCC-SLP Quinntin Malter 11/01/2017, 1:12 PM

## 2017-11-09 ENCOUNTER — Other Ambulatory Visit (HOSPITAL_COMMUNITY): Payer: Medicare Other

## 2017-11-09 LAB — COMPREHENSIVE METABOLIC PANEL
ALK PHOS: 54 U/L (ref 38–126)
ALT: 15 U/L (ref 14–54)
ANION GAP: 7 (ref 5–15)
AST: 16 U/L (ref 15–41)
Albumin: 2.1 g/dL — ABNORMAL LOW (ref 3.5–5.0)
BUN: 10 mg/dL (ref 6–20)
CALCIUM: 8.9 mg/dL (ref 8.9–10.3)
CO2: 31 mmol/L (ref 22–32)
CREATININE: 0.41 mg/dL — AB (ref 0.44–1.00)
Chloride: 98 mmol/L — ABNORMAL LOW (ref 101–111)
Glucose, Bld: 175 mg/dL — ABNORMAL HIGH (ref 65–99)
Potassium: 4.4 mmol/L (ref 3.5–5.1)
SODIUM: 136 mmol/L (ref 135–145)
TOTAL PROTEIN: 5.3 g/dL — AB (ref 6.5–8.1)
Total Bilirubin: 0.7 mg/dL (ref 0.3–1.2)

## 2017-11-09 LAB — CBC WITH DIFFERENTIAL/PLATELET
BASOS ABS: 0 10*3/uL (ref 0.0–0.1)
BASOS PCT: 0 %
EOS PCT: 4 %
Eosinophils Absolute: 0.2 10*3/uL (ref 0.0–0.7)
HEMATOCRIT: 31 % — AB (ref 36.0–46.0)
Hemoglobin: 10 g/dL — ABNORMAL LOW (ref 12.0–15.0)
LYMPHS PCT: 23 %
Lymphs Abs: 1.1 10*3/uL (ref 0.7–4.0)
MCH: 33.2 pg (ref 26.0–34.0)
MCHC: 32.3 g/dL (ref 30.0–36.0)
MCV: 103 fL — AB (ref 78.0–100.0)
MONO ABS: 0.5 10*3/uL (ref 0.1–1.0)
MONOS PCT: 11 %
NEUTROS ABS: 3.1 10*3/uL (ref 1.7–7.7)
Neutrophils Relative %: 62 %
PLATELETS: 212 10*3/uL (ref 150–400)
RBC: 3.01 MIL/uL — ABNORMAL LOW (ref 3.87–5.11)
RDW: 15.1 % (ref 11.5–15.5)
WBC: 4.9 10*3/uL (ref 4.0–10.5)

## 2017-11-09 LAB — PROTIME-INR
INR: 1.06
Prothrombin Time: 13.8 seconds (ref 11.4–15.2)

## 2017-11-09 LAB — HEMOGLOBIN A1C
HEMOGLOBIN A1C: 6.7 % — AB (ref 4.8–5.6)
MEAN PLASMA GLUCOSE: 145.59 mg/dL

## 2017-11-09 DEATH — deceased

## 2017-11-11 LAB — BLOOD GAS, ARTERIAL
Acid-Base Excess: 8.7 mmol/L — ABNORMAL HIGH (ref 0.0–2.0)
BICARBONATE: 33.2 mmol/L — AB (ref 20.0–28.0)
FIO2: 40
O2 SAT: 96.2 %
PATIENT TEMPERATURE: 98.6
PCO2 ART: 50.6 mmHg — AB (ref 32.0–48.0)
PO2 ART: 83 mmHg (ref 83.0–108.0)
pH, Arterial: 7.432 (ref 7.350–7.450)

## 2017-11-11 MED FILL — Ceftazidime For IV Soln 2 GM and Dextrose 5% (50ML): INTRAVENOUS | Qty: 50 | Status: AC

## 2017-11-12 ENCOUNTER — Other Ambulatory Visit (HOSPITAL_COMMUNITY): Payer: Medicare Other

## 2017-11-13 ENCOUNTER — Ambulatory Visit: Payer: Self-pay | Admitting: Family Medicine

## 2017-11-13 LAB — CBC
HCT: 33.2 % — ABNORMAL LOW (ref 36.0–46.0)
HEMOGLOBIN: 10.1 g/dL — AB (ref 12.0–15.0)
MCH: 32.6 pg (ref 26.0–34.0)
MCHC: 30.4 g/dL (ref 30.0–36.0)
MCV: 107.1 fL — ABNORMAL HIGH (ref 78.0–100.0)
Platelets: 200 10*3/uL (ref 150–400)
RBC: 3.1 MIL/uL — AB (ref 3.87–5.11)
RDW: 14.6 % (ref 11.5–15.5)
WBC: 4.3 10*3/uL (ref 4.0–10.5)

## 2017-11-13 LAB — BASIC METABOLIC PANEL
ANION GAP: 8 (ref 5–15)
BUN: 9 mg/dL (ref 6–20)
CALCIUM: 8.8 mg/dL — AB (ref 8.9–10.3)
CO2: 37 mmol/L — ABNORMAL HIGH (ref 22–32)
Chloride: 94 mmol/L — ABNORMAL LOW (ref 101–111)
Creatinine, Ser: 0.43 mg/dL — ABNORMAL LOW (ref 0.44–1.00)
Glucose, Bld: 186 mg/dL — ABNORMAL HIGH (ref 65–99)
Potassium: 4.2 mmol/L (ref 3.5–5.1)
Sodium: 139 mmol/L (ref 135–145)

## 2017-11-15 ENCOUNTER — Other Ambulatory Visit (HOSPITAL_COMMUNITY): Payer: Medicare Other

## 2017-11-16 ENCOUNTER — Other Ambulatory Visit (HOSPITAL_COMMUNITY): Payer: Medicare Other

## 2017-11-17 LAB — BASIC METABOLIC PANEL
Anion gap: 11 (ref 5–15)
BUN: 37 mg/dL — AB (ref 6–20)
CALCIUM: 9.1 mg/dL (ref 8.9–10.3)
CO2: 35 mmol/L — AB (ref 22–32)
CREATININE: 0.98 mg/dL (ref 0.44–1.00)
Chloride: 92 mmol/L — ABNORMAL LOW (ref 101–111)
GFR calc Af Amer: >60 mL/min (ref >60)
GFR calc non Af Amer: 53 mL/min — ABNORMAL LOW (ref >60)
GLUCOSE: 274 mg/dL — AB (ref 65–99)
Potassium: 5.1 mmol/L (ref 3.5–5.1)
Sodium: 138 mmol/L (ref 135–145)

## 2017-11-17 LAB — CBC WITH DIFFERENTIAL/PLATELET
Basophils Absolute: 0 10*3/uL (ref 0.0–0.1)
Basophils Relative: 0 %
EOS PCT: 0 %
Eosinophils Absolute: 0 10*3/uL (ref 0.0–0.7)
HEMATOCRIT: 36.7 % (ref 36.0–46.0)
Hemoglobin: 11.2 g/dL — ABNORMAL LOW (ref 12.0–15.0)
LYMPHS PCT: 8 %
Lymphs Abs: 0.6 10*3/uL — ABNORMAL LOW (ref 0.7–4.0)
MCH: 32.7 pg (ref 26.0–34.0)
MCHC: 30.5 g/dL (ref 30.0–36.0)
MCV: 107 fL — AB (ref 78.0–100.0)
Monocytes Absolute: 1.5 10*3/uL — ABNORMAL HIGH (ref 0.1–1.0)
Monocytes Relative: 19 %
Neutro Abs: 5.5 10*3/uL (ref 1.7–7.7)
Neutrophils Relative %: 73 %
PLATELETS: 301 10*3/uL (ref 150–400)
RBC: 3.43 MIL/uL — ABNORMAL LOW (ref 3.87–5.11)
RDW: 14.7 % (ref 11.5–15.5)
WBC: 7.6 10*3/uL (ref 4.0–10.5)

## 2017-11-21 ENCOUNTER — Telehealth: Payer: Self-pay

## 2017-11-21 NOTE — Telephone Encounter (Signed)
On 11/21/17 I received a d/c from [facility] (original). The d/c is for burial. The patient is a patient of Marda Stalker.  The d/c will be taken to Pulmonary Unit @ [facility] for signature.  After talking with Doctor Halford Chessman this d/c needs to go to the pulmonary office in [place]. I called the funeral home and they are going to come back and pickup the d/c.

## 2017-11-22 ENCOUNTER — Telehealth: Payer: Self-pay | Admitting: Pulmonary Disease

## 2017-11-22 NOTE — Telephone Encounter (Signed)
Called rep for [employer] home. Informed that death certificate that was dropped off will need to be taken to [facility] for completion. Patient died at [facility]. Death certificate placed at front desk.

## 2017-11-22 NOTE — Telephone Encounter (Deleted)
Death certificate placed in DS folder for completion. 

## 2017-11-22 NOTE — Telephone Encounter (Signed)
[facility] dropped off Death Certificate to be signed Placed in nurse box

## 2017-12-10 DEATH — deceased

## 2018-12-31 IMAGING — DX DG CHEST 1V PORT
1 series · 1 of 1 positions shown · non-contrast
Comparison: CT 12/31/2016

CLINICAL DATA: Respiratory distress

EXAM:
PORTABLE CHEST 1 VIEW

[chest ap]
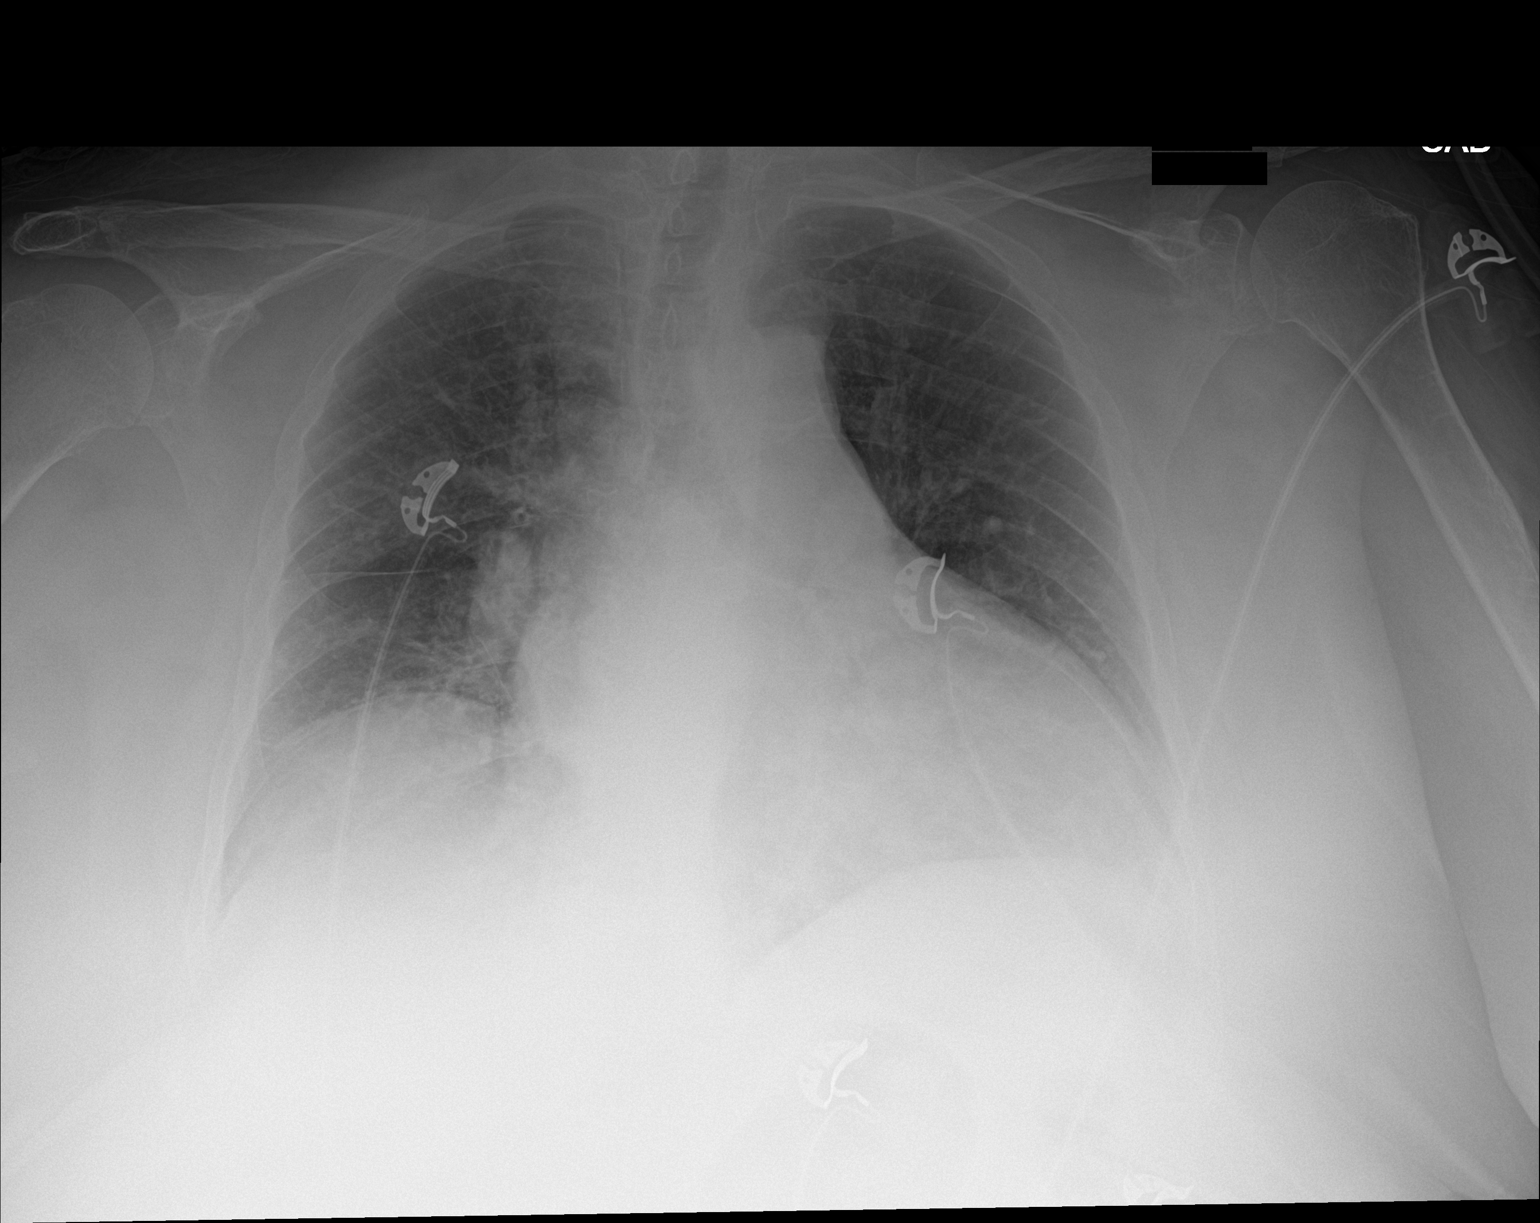

[1 of 1 positions shown; findings below may reference images not displayed]

FINDINGS: Cardiac silhouette is enlarged. Elevation of the RIGHT
hemidiaphragm. No effusion, infiltrate or pneumothorax.
IMPRESSION: Cardiomegaly and elevation RIGHT hemidiaphragm.  No Acute findings

## 2019-01-01 IMAGING — DX DG CHEST 1V PORT
1 series · 1 of 1 positions shown · non-contrast
Comparison: 10/20/2017

CLINICAL DATA: Central line placement

EXAM:
PORTABLE CHEST 1 VIEW

[chest ap]
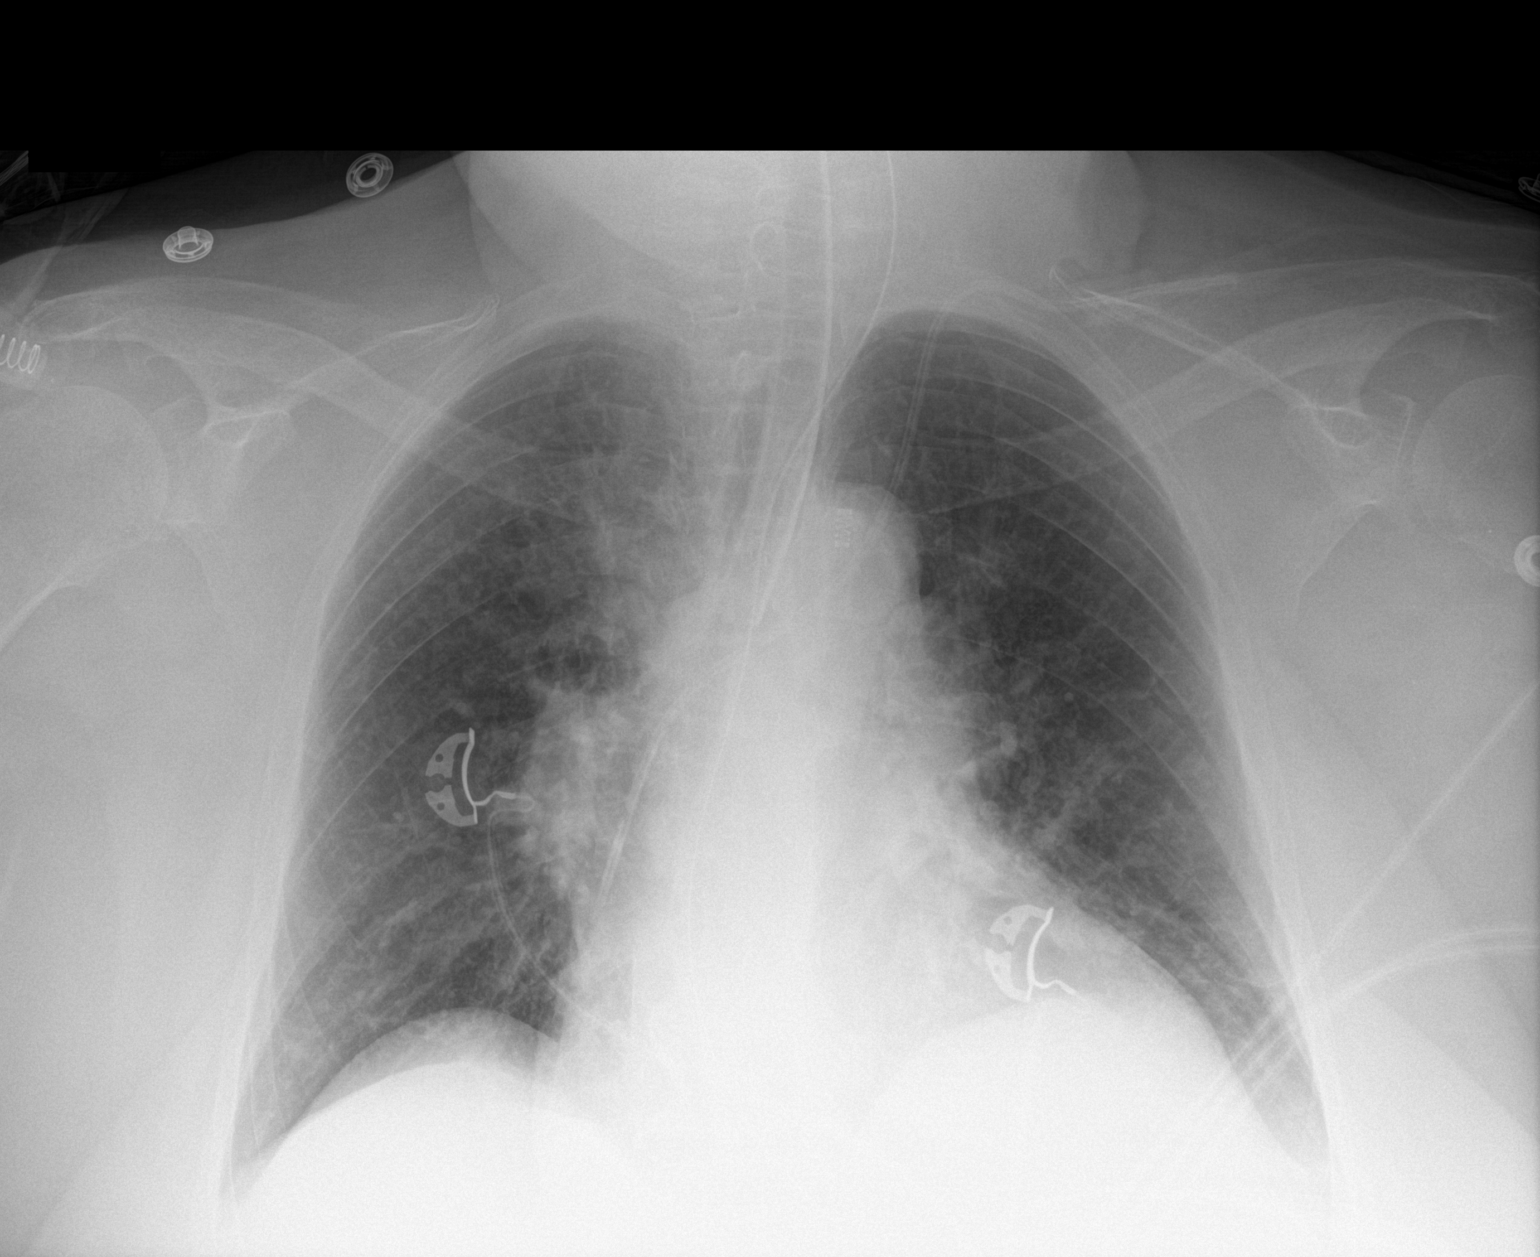

[1 of 1 positions shown; findings below may reference images not displayed]

FINDINGS: Endotracheal tube is near the carina, 10 mm above the carina. Left
central line tip is at the cavoatrial junction. NG tube enters the
stomach.

Cardiomegaly with vascular congestion. No confluent opacities.
Possible small layering left effusion. No acute bony abnormality.
IMPRESSION: Endotracheal tube is near the carina, 10 mm above the carina. Left
central line tip at the cavoatrial junction. No pneumothorax.

Cardiomegaly, vascular congestion.

Suspect small layering left effusion.

## 2019-01-04 IMAGING — DX DG CHEST 1V PORT
1 series · 1 of 1 positions shown · non-contrast
Comparison: Chest radiograph performed 10/23/2017

CLINICAL DATA: Acute onset of respiratory failure.

EXAM:
PORTABLE CHEST 1 VIEW

[chest ap]
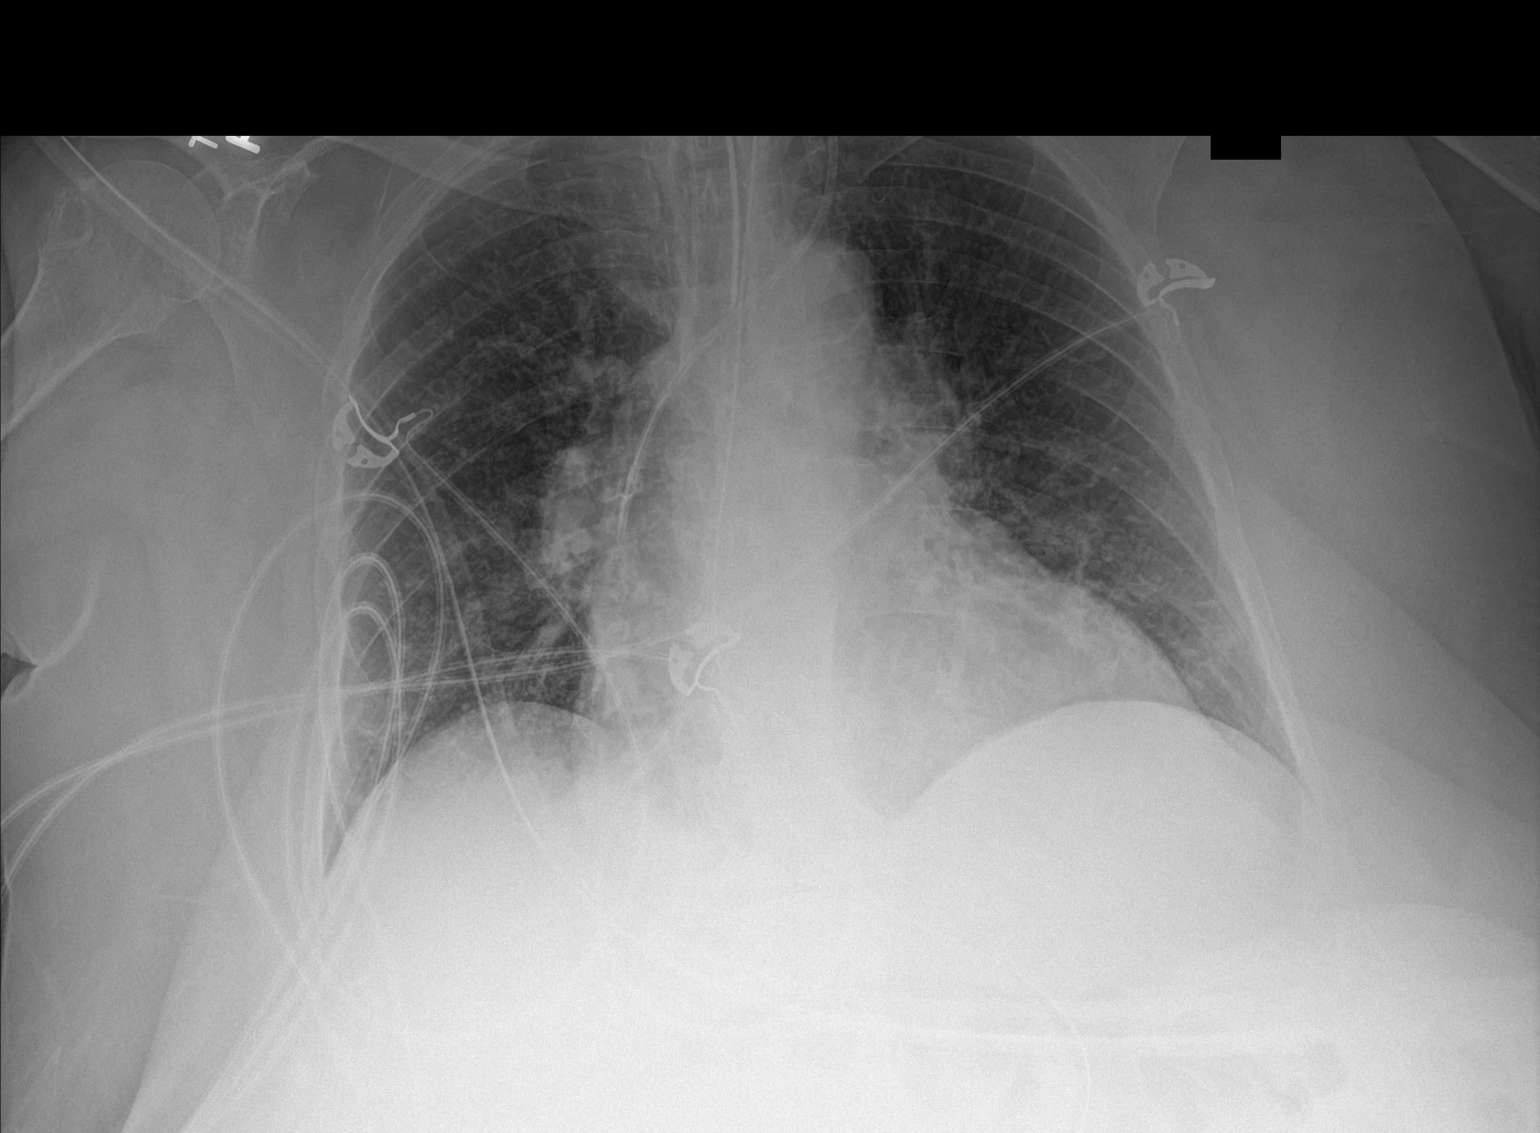

[1 of 1 positions shown; findings below may reference images not displayed]

FINDINGS: The patient's endotracheal tube is seen ending 2 cm above the
carina. An enteric tube is noted extending below the diaphragm. A
left IJ line is noted ending about the mid SVC.

Mild left basilar opacity may reflect atelectasis or possibly mild
infection. Peribronchial thickening is noted. No pleural effusion or
pneumothorax is seen.

The cardiomediastinal silhouette is mildly enlarged. No acute
osseous abnormalities are identified.
IMPRESSION: 1. Endotracheal tube seen ending 2 cm above the carina.
2. Mild left basilar airspace opacity may reflect atelectasis or
possibly mild infection. Peribronchial thickening noted.
3. Mild cardiomegaly.

## 2019-01-08 IMAGING — DX DG ABDOMEN 1V
1 series · 1 of 1 positions shown · non-contrast
Comparison: 10/21/2017

CLINICAL DATA: Orogastric tube placement

EXAM:
ABDOMEN - 1 VIEW

[abdomen kub]
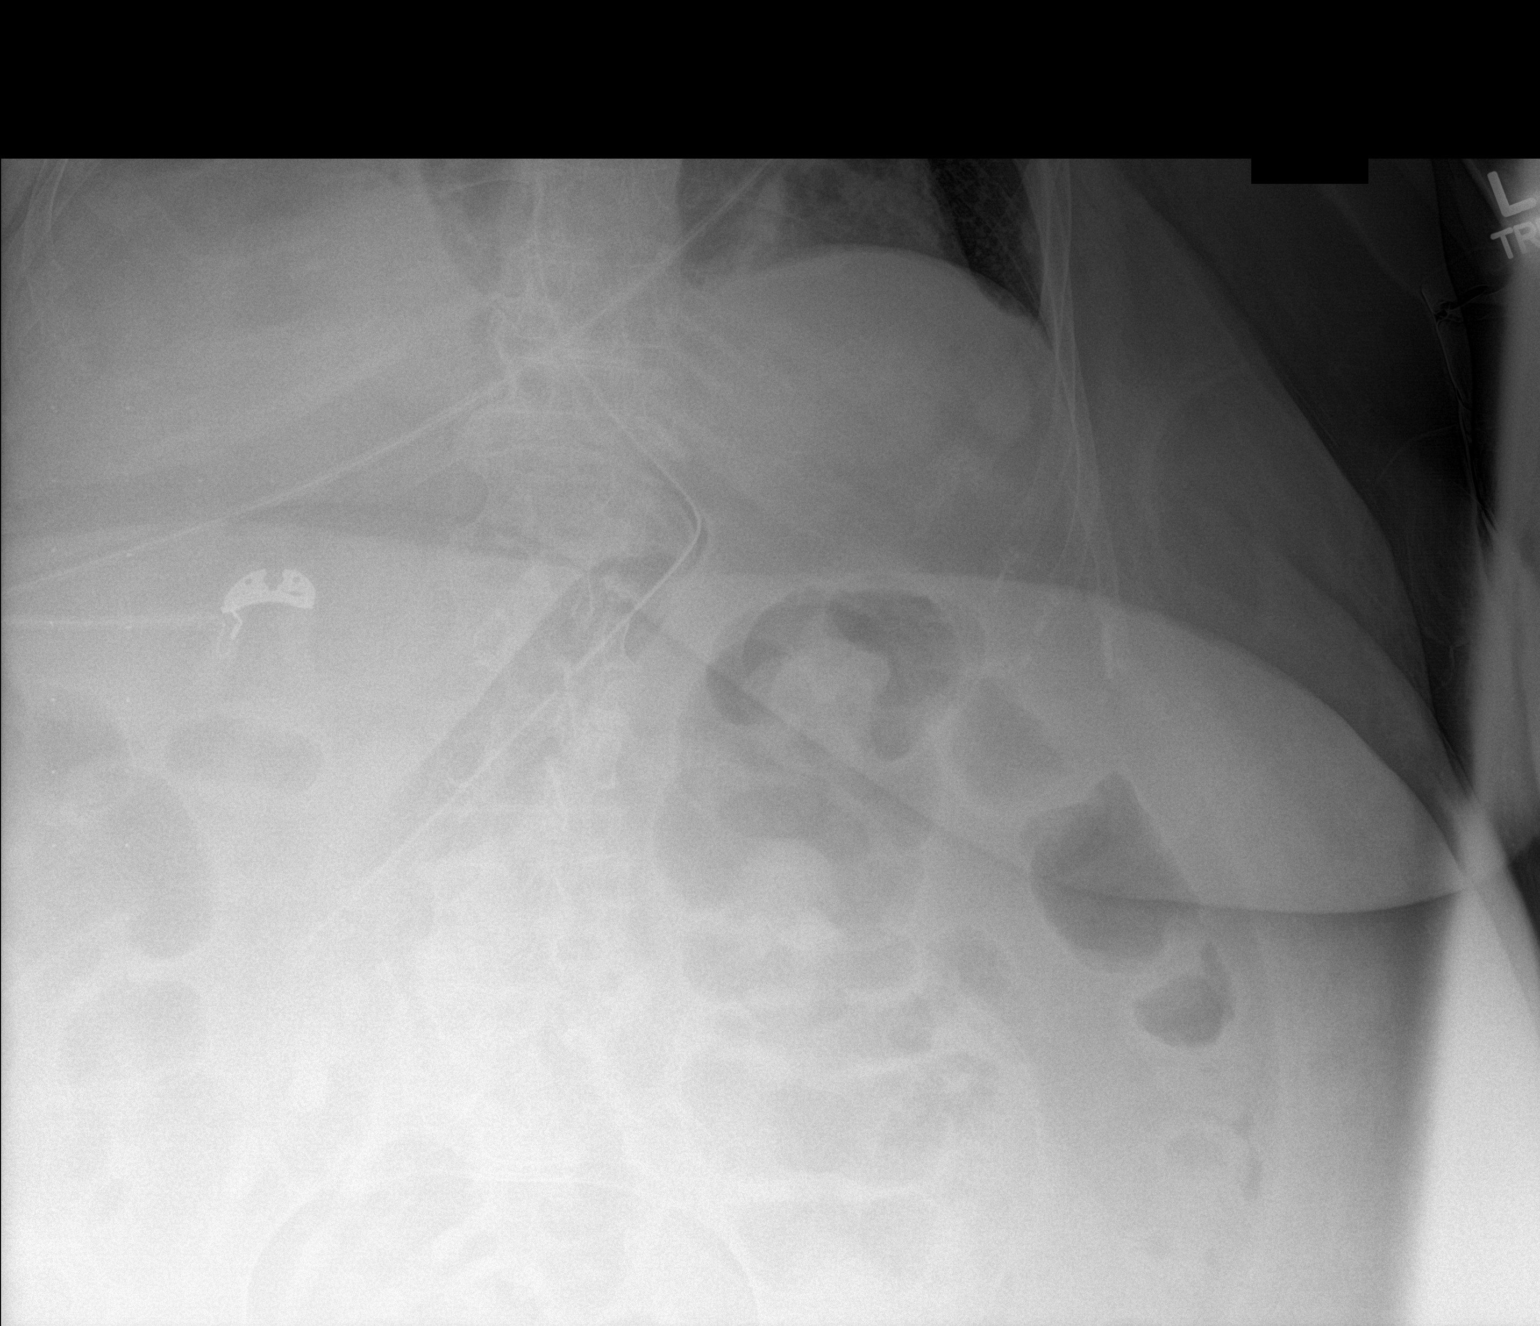

[1 of 1 positions shown; findings below may reference images not displayed]

FINDINGS: Enteric tube tip is in the mid abdomen to the right of midline
consistent with location in the distal stomach. Shallow inspiration
with atelectasis in the right lung base. Scattered gas and stool in
the visualized colon.
IMPRESSION: Enteric tube tip in the mid abdomen consistent with location in the
distal stomach.

## 2019-01-08 IMAGING — DX DG CHEST 1V PORT
1 series · 1 of 1 positions shown · non-contrast
Comparison: Portable exam 2566 hours compared 10/27/2017

CLINICAL DATA: Intubation, history atrial fibrillation, asthma,
COPD, diabetes mellitus, myasthenia gravis, coronary artery disease

EXAM:
PORTABLE CHEST 1 VIEW

[chest ap]
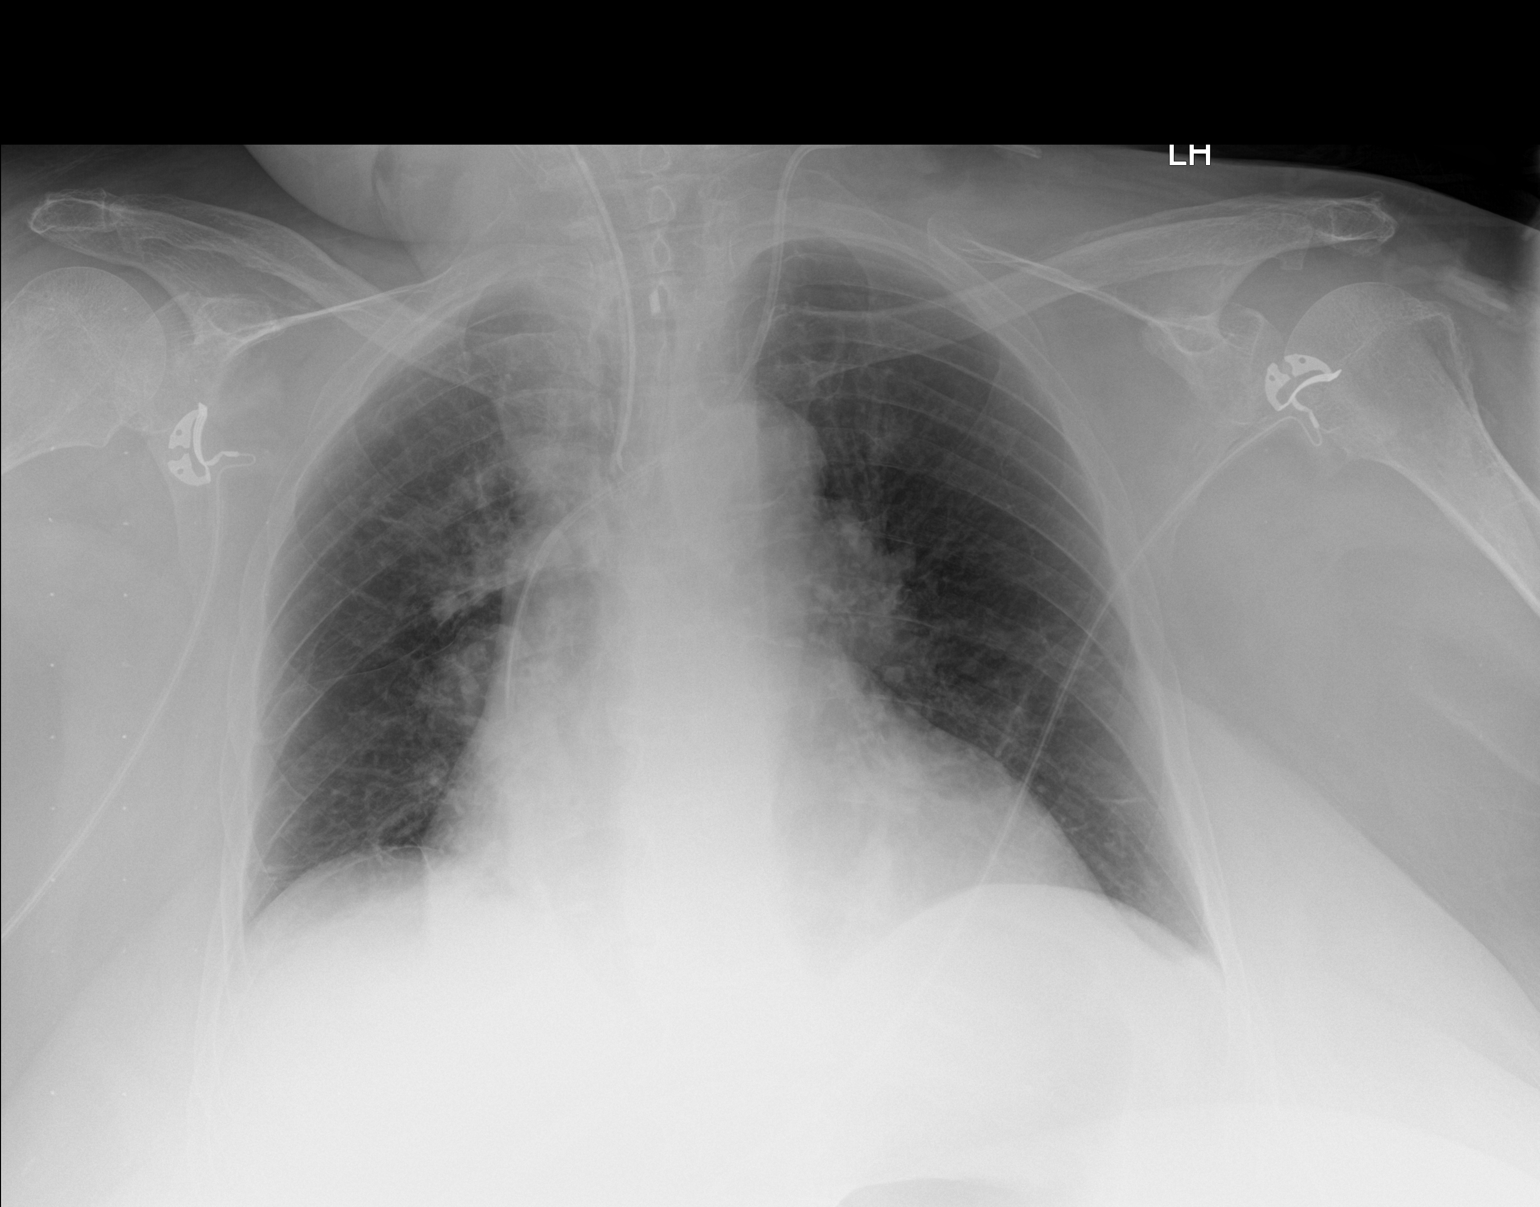

[1 of 1 positions shown; findings below may reference images not displayed]

FINDINGS: Of endotracheal tube projects 2.4 cm above carina.

LEFT jugular central venous catheter with tip projecting over SVC.

Rotation to the RIGHT.

Enlargement of cardiac silhouette with slight vascular congestion.

Mediastinal contours normal.

Atherosclerotic calcification aorta.

Improved pulmonary edema.

Minimal subsegmental atelectasis at RIGHT base with mild atelectasis
versus consolidation in retrocardiac LEFT lower lobe.

Prominence of LEFT hilum may be related to rotation.

No gross pleural effusion or pneumothorax.
IMPRESSION: Improved aeration since previous exam.
